# Patient Record
Sex: Female | Born: 1990 | Race: White | Hispanic: No | Marital: Single | Smoking: Current every day smoker
Health system: Southern US, Community
[De-identification: ages and names within clinical notes are randomized; demographics above are authoritative.]

## PROBLEM LIST (undated history)

## (undated) DIAGNOSIS — N059 Unspecified nephritic syndrome with unspecified morphologic changes: Secondary | ICD-10-CM

## (undated) DIAGNOSIS — N1831 Chronic kidney disease, stage 3a: Secondary | ICD-10-CM

## (undated) DIAGNOSIS — B9562 Methicillin resistant Staphylococcus aureus infection as the cause of diseases classified elsewhere: Secondary | ICD-10-CM

## (undated) DIAGNOSIS — F1120 Opioid dependence, uncomplicated: Secondary | ICD-10-CM

## (undated) DIAGNOSIS — M00012 Staphylococcal arthritis, left shoulder: Secondary | ICD-10-CM

## (undated) DIAGNOSIS — K739 Chronic hepatitis, unspecified: Secondary | ICD-10-CM

## (undated) DIAGNOSIS — N189 Chronic kidney disease, unspecified: Secondary | ICD-10-CM

## (undated) DIAGNOSIS — R7881 Bacteremia: Secondary | ICD-10-CM

## (undated) DIAGNOSIS — Z8619 Personal history of other infectious and parasitic diseases: Secondary | ICD-10-CM

## (undated) DIAGNOSIS — I2690 Septic pulmonary embolism without acute cor pulmonale: Secondary | ICD-10-CM

## (undated) DIAGNOSIS — M869 Osteomyelitis, unspecified: Secondary | ICD-10-CM

## (undated) DIAGNOSIS — F1910 Other psychoactive substance abuse, uncomplicated: Secondary | ICD-10-CM

## (undated) DIAGNOSIS — I079 Rheumatic tricuspid valve disease, unspecified: Secondary | ICD-10-CM

## (undated) DIAGNOSIS — M009 Pyogenic arthritis, unspecified: Secondary | ICD-10-CM

## (undated) DIAGNOSIS — Z681 Body mass index (BMI) 19 or less, adult: Secondary | ICD-10-CM

## (undated) HISTORY — DX: Chronic kidney disease, unspecified: N18.9

## (undated) HISTORY — DX: Septic pulmonary embolism without acute cor pulmonale: I26.90

## (undated) HISTORY — DX: Osteomyelitis, unspecified: M86.9

## (undated) HISTORY — DX: Body mass index (BMI) 19.9 or less, adult: Z68.1

## (undated) HISTORY — DX: Chronic hepatitis, unspecified: K73.9

## (undated) HISTORY — DX: Bacteremia: R78.81

## (undated) HISTORY — DX: Personal history of other infectious and parasitic diseases: Z86.19

## (undated) HISTORY — DX: Staphylococcal arthritis, left shoulder: M00.012

## (undated) HISTORY — DX: Unspecified nephritic syndrome with unspecified morphologic changes: N05.9

## (undated) HISTORY — DX: Rheumatic tricuspid valve disease, unspecified: I07.9

## (undated) HISTORY — DX: Pyogenic arthritis, unspecified: M00.9

## (undated) HISTORY — DX: Opioid dependence, uncomplicated: F11.20

## (undated) HISTORY — DX: Chronic kidney disease, stage 3a: N18.31

## (undated) HISTORY — DX: Methicillin resistant Staphylococcus aureus infection as the cause of diseases classified elsewhere: B95.62

---
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
## 2010-01-23 ENCOUNTER — Ambulatory Visit: Payer: Self-pay | Admitting: Family Medicine

## 2010-01-23 DIAGNOSIS — J069 Acute upper respiratory infection, unspecified: Secondary | ICD-10-CM | POA: Insufficient documentation

## 2010-01-23 DIAGNOSIS — J9801 Acute bronchospasm: Secondary | ICD-10-CM

## 2010-01-23 HISTORY — DX: Acute bronchospasm: J98.01

## 2010-01-26 ENCOUNTER — Ambulatory Visit: Payer: Self-pay | Admitting: Family Medicine

## 2010-01-26 DIAGNOSIS — J309 Allergic rhinitis, unspecified: Secondary | ICD-10-CM | POA: Insufficient documentation

## 2010-01-27 ENCOUNTER — Ambulatory Visit: Payer: Self-pay

## 2010-01-27 ENCOUNTER — Encounter: Payer: Self-pay | Admitting: Family Medicine

## 2010-01-27 IMAGING — CR DG CHEST 2V
1 series · 2 of 2 positions shown · non-contrast
Comparison: none

REASON FOR EXAM: cough, sob, night sweats, asthma exacerbation
COMMENTS:

PROCEDURE:     DXR - DXR CHEST PA (OR AP) AND LATERAL  - [DATE]  [DATE]
RESULT:     The lung fields are clear. The heart, mediastinal and osseous
structures reveal no significant abnormalities.

[Series 1: view not recorded · 0.17mm/px · 2 of 2 slices shown]
[im 1/2]
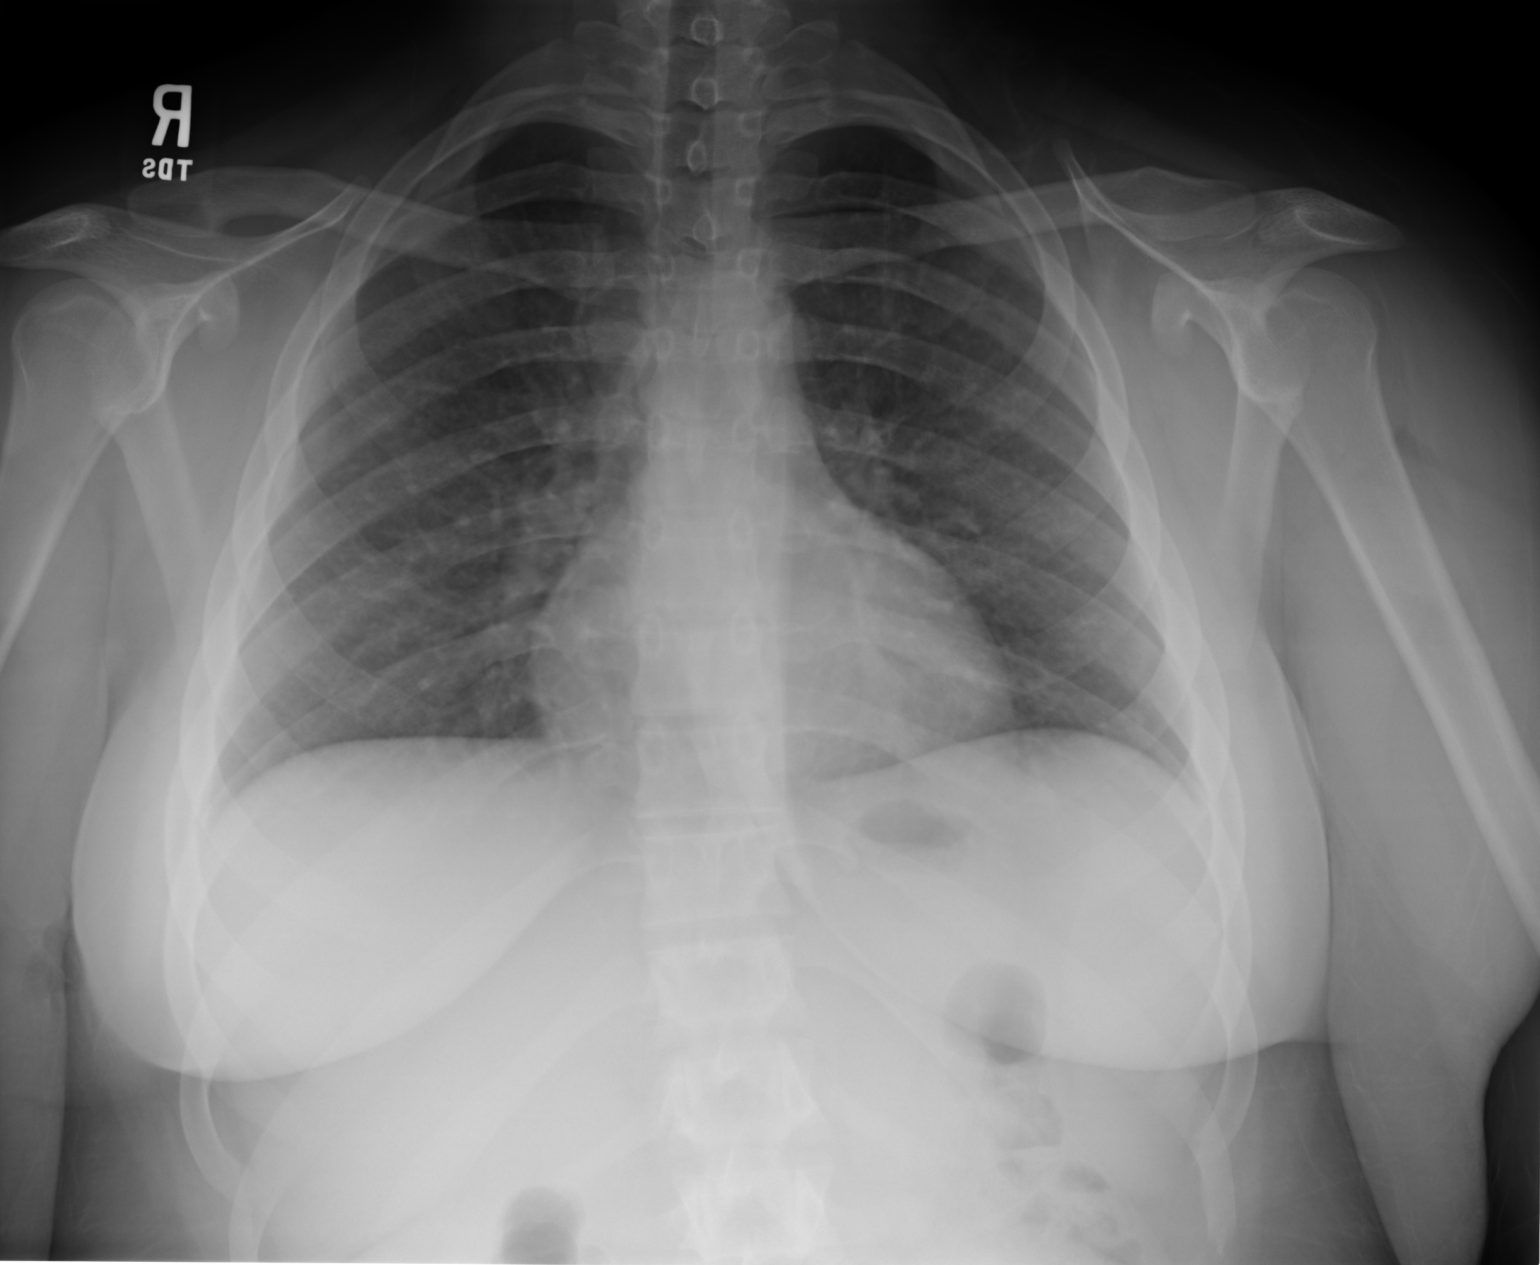
[im 2/2]
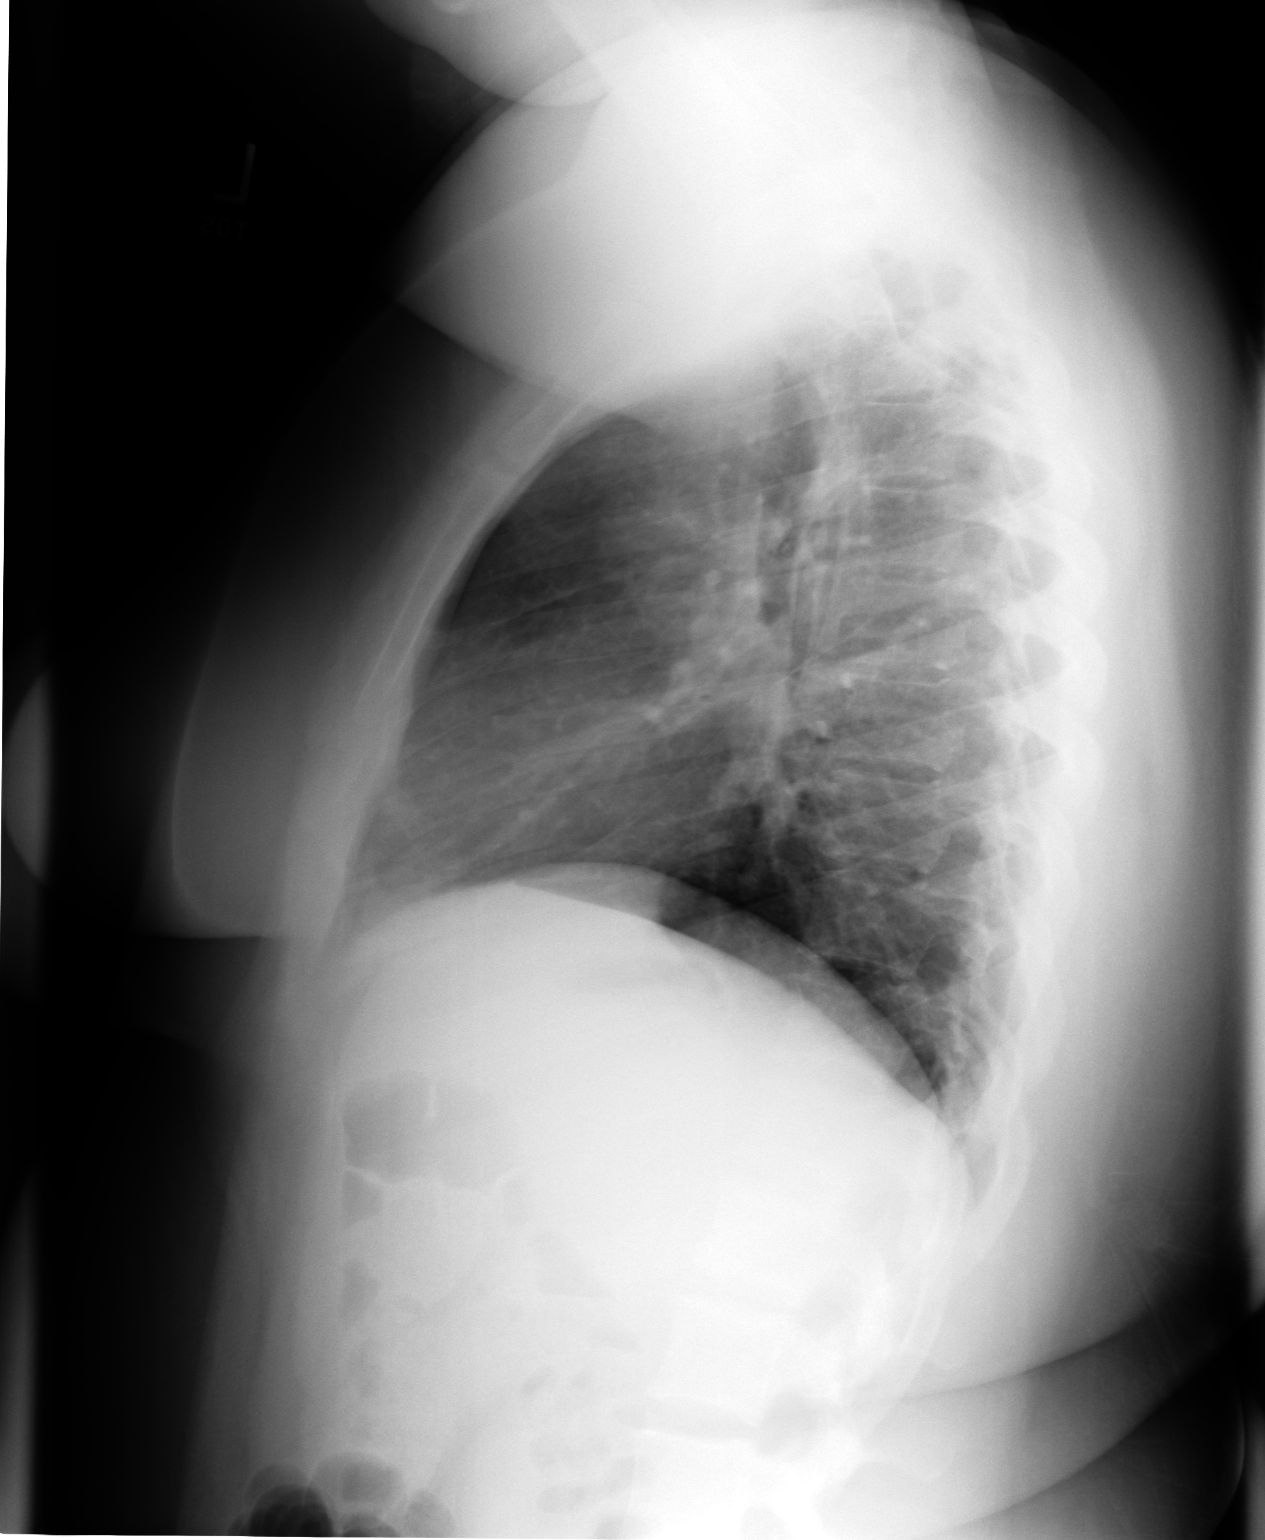

[2 of 2 positions shown; findings below may reference images not displayed]

IMPRESSION: No acute changes are identified.

## 2010-01-28 ENCOUNTER — Encounter: Payer: Self-pay | Admitting: Family Medicine

## 2010-01-30 ENCOUNTER — Ambulatory Visit: Payer: Self-pay | Admitting: Family Medicine

## 2010-02-01 ENCOUNTER — Ambulatory Visit: Payer: Self-pay | Admitting: Family Medicine

## 2010-02-01 ENCOUNTER — Encounter (INDEPENDENT_AMBULATORY_CARE_PROVIDER_SITE_OTHER): Payer: Self-pay | Admitting: Family Medicine

## 2010-02-01 ENCOUNTER — Encounter: Payer: Self-pay | Admitting: Family Medicine

## 2010-02-23 ENCOUNTER — Ambulatory Visit: Payer: Self-pay | Admitting: Family Medicine

## 2010-02-23 DIAGNOSIS — F17200 Nicotine dependence, unspecified, uncomplicated: Secondary | ICD-10-CM | POA: Insufficient documentation

## 2010-02-23 HISTORY — DX: Nicotine dependence, unspecified, uncomplicated: F17.200

## 2010-03-06 ENCOUNTER — Ambulatory Visit: Payer: Self-pay | Admitting: Family Medicine

## 2010-03-06 DIAGNOSIS — J312 Chronic pharyngitis: Secondary | ICD-10-CM

## 2010-03-06 HISTORY — DX: Chronic pharyngitis: J31.2

## 2010-07-04 ENCOUNTER — Ambulatory Visit: Payer: Self-pay | Admitting: Family Medicine

## 2010-07-04 DIAGNOSIS — J45909 Unspecified asthma, uncomplicated: Secondary | ICD-10-CM | POA: Insufficient documentation

## 2010-10-22 NOTE — Letter (Signed)
Summary: *Referral Letter  [facility]  [address]   [place], [place] [address]   Phone: [phone]  Fax: [phone]    01/26/2010  Thank you in advance for agreeing to see my patient:  Carol Rivera [address] [place], [place]  [address]  Phone: [phone]  x-ray order  Reason for Referral: Cough, SOB, NightSweats, Asthma Exacerbation  Procedures Requested:   Chest X-Ray PA/LAT  Current Medical Problems: 1)  ASTHMA (ICD-493.90) 2)  UPPER RESPIRATORY INFECTION, ACUTE, WITH BRONCHITIS (ICD-465.9) 3)  ACUTE BRONCHOSPASM (ICD-519.11) 4) Severe Cough   Past Medical History: 1)  Asthma  Please fax results to [phone]   Thank you again for agreeing to see our patient; please contact us if you have any further questions or need additional information.  Sincerely,  Standley Dakins MD  [phone] [facility], [place]

## 2010-10-22 NOTE — Assessment & Plan Note (Signed)
Summary: RECHECK ASTHMA/EVM   Vital Signs:  Patient profile:   20 year old female Menstrual status:  irregular Height:      62 inches Weight:      221 pounds BMI:     40.57 O2 Sat:      97 % on Room air Temp:     97.7 degrees F oral Pulse rate:   102 / minute Pulse rhythm:   regular Resp:     20 per minute BP sitting:   128 / 70  (right arm)  Vitals Entered By: Levonne Spiller EMT-P (Jan 30, 2010 4:14 PM)  O2 Flow:  Room air  Primary Care Provider:  none   History of Present Illness: Pt returns today for re-evaluation.  Reports that she has been doing her nebulizer scheduled as directed and feels somewhat better. She is still coughing, has rhinorrhea and wheezing. No f/c. Her xray last visit was negative. She feels warm and having night sweats. She however reports that she is feeling better. She was not able to afford the inhaler as it is no longer on the $4 list. She did not start taking the Abx as she was afraid of taking too many medications at one time.   Preventive Screening-Counseling & Management  Alcohol-Tobacco     Smoking Cessation Counseling: yes  Allergies (verified): No Known Drug Allergies  Review of Systems ENT:  Complains of nasal congestion; denies sinus pressure and sore throat. Resp:  Complains of chest discomfort, cough, and wheezing; denies shortness of breath.  Physical Exam  General:  alert, well-developed, and well-nourished.   Ears:  R ear normal and L ear normal.   Neck:  supple and no masses.   Chest Wall:  no tenderness.   Lungs:  R wheezes and L wheezes.   improved after 2 nebulizers - still with increased wheezes right anterior lobe. Heart:  normal rate, regular rhythm, and no murmur.   Cervical Nodes:  No lymphadenopathy noted Psych:  Oriented X3.     Impression & Recommendations:  Problem # 1:  ASTHMA UNSPECIFIED WITH EXACERBATION (ICD-493.92)  Her updated medication list for this problem includes:    Albuterol Sulfate (2.5  Mg/73ml) 0.083% Nebu (Albuterol sulfate) .Marland Kitchen... 1 to use in nebulizer q4-6 hours as needed for shortness of breath/wheeze    Ipratropium Bromide 0.02 % Soln (Ipratropium bromide) ..... Mix 1 vial with albuterol for nebulizer treatments every 4 hours as needed sob, wheezing, cough    Prednisone (pak) 10 Mg Tabs (Prednisone) .Marland Kitchen... Take as directed for the 6 day course of treatment - complete the course    Ventolin Hfa 108 (90 Base) Mcg/act Aers (Albuterol sulfate) .Marland Kitchen... 2 puffs q 3 hours as needed wheezing, sob, cough   continue out of work status.  xray negative.   Orders: Nebulizer Tx ([address]) Nebulizer Tx ([address])  Complete Medication List: 1)  Albuterol Sulfate (2.5 Mg/51ml) 0.083% Nebu (Albuterol sulfate) .Marland Kitchen.. 1 to use in nebulizer q4-6 hours as needed for shortness of breath/wheeze 2)  Doxycycline Hyclate 100 Mg Caps (Doxycycline hyclate) .... Take 1 by mouth two times a day with food 3)  Ipratropium Bromide 0.02 % Soln (Ipratropium bromide) .... Mix 1 vial with albuterol for nebulizer treatments every 4 hours as needed sob, wheezing, cough 4)  Prednisone (pak) 10 Mg Tabs (Prednisone) .... Take as directed for the 6 day course of treatment 5)  Loratadine 10 Mg Tabs (Loratadine) .... Take 1 by mouth daily as needed for allergies 6)  Ventolin Hfa 108 (90 Base) Mcg/act Aers (Albuterol sulfate) .... 2 puffs q 3 hours as needed wheezing, sob, cough  Patient Instructions: 1)  Return to be re-evaluated in 2-3 days.  2)  Please take antibiotic as previously prescribed. (doxycycline)  3)  Please finish taking the prednisone as well.  4)  The other medications you may hold or take as needed.  5)  Tobacco is very bad for your health and your loved ones! You Should stop smoking!.   The patient was informed that there is no on-call provider or services available at this clinic during off-hours (when the clinic is closed).  If the patient developed a problem or concern that required immediate attention,  the patient was advised to go the the nearest available urgent care or emergency department for medical care.  The patient verbalized understanding.      The risks, benefits and possible side effects were clearly explained and discussed with the patient.  The patient verbalized clear understanding.  The patient was given instructions to return if symptoms don't improve, worsen or new changes develop.  If it is not during clinic hours and the patient cannot get back to this clinic then the patient was told to seek medical care at an available urgent care or emergency department.  The patient verbalized understanding.    I have reviewed the above medical office visit documention, including diagnoses, history, medications, clinical lists, orders and plan of care.   Rodney Langton, MD, FAAFP  Jan 31, 2010

## 2010-10-22 NOTE — Letter (Signed)
Summary: Generic Letter  [facility]  [address]   [place], [place] [address]   Phone: [phone]  Fax: [phone]    02/23/2010 MRN: [id number]  [address] [place], [place]  [address]  To Whom It May Concern,  Carol Rivera was seen as a patient in our office today and asked to stay out of work today while recovering from an illness.  She may return to work tomorrow 02/24/10.           Sincerely,   Standley Dakins MD [facility]

## 2010-10-22 NOTE — Letter (Signed)
Summary: Work [employer]  [address]   [place], [place] [address]   Phone: [phone]  Fax: [phone]    Today's Date: July 04, 2010  Name of Patient: Carol Rivera  The above named patient had a medical visit today at:  3:30 pm.  Please take this into consideration when reviewing the time away from work/school.    Special Instructions:  [  ] None  [X]  To be off the remainder of today, returning to the normal work / school schedule tomorrow.  [  ] To be off until the next scheduled appointment on ______________________.  [  ] Other ________________________________________________________________ ________________________________________________________________________   Sincerely yours,   Standley Dakins MD

## 2010-10-22 NOTE — Assessment & Plan Note (Signed)
Summary: sore throat/jbb  this is  Vital Signs:  Patient profile:   20 year old female Menstrual status:  regular LMP:     07/04/2010 Height:      62 inches Weight:      200 pounds O2 Sat:      98 % on Room air Temp:     97.2 degrees F oral Pulse rate:   104 / minute Pulse rhythm:   regular Resp:     20 per minute BP sitting:   137 / 95  (left arm)  Vitals Entered By: Levonne Spiller EMT-P (July 04, 2010 3:29 PM)  O2 Flow:  Room air  Lab Results    Ordered by:  Irven Easterly    Date tests performed: 07/04/2010    Performed by:  Levonne Spiller EMT-P    R. Strep:    Neg    LMP:      07/04/2010   CC: URI / Sore Throat / rwt Is Patient Diabetic? No Pain Assessment Patient in pain? yes     Location: neck Intensity: 7 Type: aching Onset of pain  Gradual LMP (date): 07/04/2010     Menstrual Status regular Enter LMP: 07/04/2010   Chief Complaint:  URI / Sore Throat / rwt.  History of Present Illness: This patient is presenting today because she reports that for the past 2-3 days she has been experiencing increasing wheezing and coughing and sore throat. The patient reports that she has been exposed to sick people at her home that have been coming down with strep throat. Also she reports that she has a need for refills for her asthma medications. She reports that she's using the nebulizers at home but reports that she needs her rescue inhaler and also needs refills on the ipratropium solution. The patient also reports that she needs her ibuprofen refilled for the sore throat. The patient reports that she took yesterday off work. 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 the patient denies chills but reports that she has had some hot spells. She reports that the glands under her neck has been swollen.  Preventive Screening-Counseling & Management  Alcohol-Tobacco     Smoking Status: current     Smoking Cessation Counseling: yes     Smoke Cessation  Stage: contemplative     Packs/Day: 0.5     Tobacco Counseling: to quit use of tobacco products  Comments: counseled for more than 10 minutes to stop smoking and using all tobacco nicotine products.    Problems Prior to Update: 1)  Chronic Pharyngitis  (ICD-472.1) 2)  Asthma  (ICD-493.90) 3)  Smoker  (ICD-305.1) 4)  Allergic Rhinitis  (ICD-477.9) 5)  Upper Respiratory Infection, Acute, With Bronchitis  (ICD-465.9) 6)  Acute Bronchospasm  (ICD-519.11)  Current Problems (verified): 1)  Asthma Unspecified With Exacerbation  (ICD-493.92) 2)  Asthma, Persistent, Mild  (ICD-493.90) 3)  Acute Sinusitis, Unspecified  (ICD-461.9) 4)  Chronic Pharyngitis  (ICD-472.1) 5)  Smoker  (ICD-305.1) 6)  Allergic Rhinitis  (ICD-477.9) 7)  Upper Respiratory Infection, Acute, With Bronchitis  (ICD-465.9) 8)  Acute Bronchospasm  (ICD-519.11)  Allergies (verified): 1)  ! Doxycycline  Past History:  Past Medical History: Last updated: 11-Mar-2010 Asthma Smoker Allergic rhinitis chronic active nicotine dependence Obesity  Past Surgical History: Last updated: 01/26/2010 Dental extractions  Family History: Last updated: 11-Mar-2010 Father deceased of MI - unknown age The patient is working and caring for her daughter.  The grandmother is involved in  helping care for her as well.   Social History: Last updated: 07/04/2010 1/2 ppd x 3 years    Pt has a boyfriend who has come to some office visits. Current Smoker Alcohol use-no Drug use-no  Risk Factors: Smoking Status: current (07/04/2010) Packs/Day: 0.5 (07/04/2010)  Social History: 1/2 ppd x 3 years    Pt has a boyfriend who has come to some office visits. Current Smoker Alcohol use-no Drug use-no Packs/Day:  0.5  Review of Systems       The patient complains of hoarseness.  The patient denies anorexia, fever, weight loss, weight gain, vision loss, decreased hearing, chest pain, dyspnea on exertion, peripheral edema, headaches,  hemoptysis, abdominal pain, melena, severe indigestion/heartburn, suspicious skin lesions, transient blindness, depression, abnormal bleeding, and breast masses.         All other systems reviewed and reported as negative.   Physical Exam  General:  well developed, well nourished, no acute distress Head:  normocephalic and atraumatic.   Eyes:  pupils equal, pupils round, and pupils reactive to light.   Ears:  R ear normal and L ear normal.   Nose:  mucosal erythema and mucosal edema.   Mouth:  normal dentition.   tongue ring presenttonsil hypertropied, postnasal drip, and pharyngeal crowding.   Neck:  neck supple,  trachea midline, no masses Chest Wall:  no tenderness.   Lungs:  R wheezes and L wheezes.   improved after 1 nebulizer treatments - bilateral breath sounds, no consolidation  Heart:  normal rate, regular rhythm, and no murmur.   Abdomen:  soft and non-tender.   Msk:  normal ROM.   Pulses:  R radial normal and R femoral normal.   Extremities:  No clubbing, cyanosis, edema, or deformity noted with normal full range of motion of all joints.   Neurologic:  No cranial nerve deficits noted. Station and gait are normal. Plantar reflexes are down-going bilaterally. DTRs are symmetrical throughout. Sensory, motor and coordinative functions appear intact. Skin:  no obvious rashes or lesions Cervical Nodes:  R anterior LN tender, R posterior LN tender, and L posterior LN tender.   Psych:  Cognition and judgment appear intact. Alert and cooperative with normal attention span and concentration. No apparent delusions, illusions, hallucinations   Problems:  Medical Problems Added: 1)  Dx of Asthma Unspecified With Exacerbation  (ICD-493.92) 2)  Dx of Asthma, Persistent, Mild  (ICD-493.90) 3)  Dx of Acute Sinusitis, Unspecified  (ICD-461.9)  Impression & Recommendations:  Problem # 1:  ASTHMA UNSPECIFIED WITH EXACERBATION (ICD-493.92)  Her updated medication list for this problem  includes:    Albuterol Sulfate (2.5 Mg/75ml) 0.083% Nebu (Albuterol sulfate) .Marland Kitchen... 1 to use in nebulizer q4-6 hours as needed for shortness of breath/wheeze    Ipratropium Bromide 0.02 % Soln (Ipratropium bromide) ..... Mix 1 vial with albuterol for nebulizer treatments every 4 hours as needed sob, wheezing, cough    Ventolin Hfa 108 (90 Base) Mcg/act Aers (Albuterol sulfate) .Marland Kitchen... 2 puffs q 3 hours as needed wheezing, sob, cough    Proventil Hfa 108 (90 Base) Mcg/act Aers (Albuterol sulfate) ..... Inhale 2 puffs four times a day as needed.    Prednisone 10 Mg Tabs (Prednisone) .Marland Kitchen... Take 3 tabs by mouth daily for 2 days then 2 tabs daily for 3 days  Orders: Tobacco use cessation intensive >10 minutes ([address])  Problem # 2:  ACUTE SINUSITIS, UNSPECIFIED (ICD-461.9)  Her updated medication list for this problem includes:    Amoxicillin 500  Mg Caps (Amoxicillin) .Marland Kitchen... Take 1 by mouth every 8 hours  Problem # 3:  SMOKER (ICD-305.1) Assessment: Unchanged  Orders: Tobacco use cessation intensive >10 minutes ([address])  Complete Medication List: 1)  Albuterol Sulfate (2.5 Mg/57ml) 0.083% Nebu (Albuterol sulfate) .Marland Kitchen.. 1 to use in nebulizer q4-6 hours as needed for shortness of breath/wheeze 2)  Ipratropium Bromide 0.02 % Soln (Ipratropium bromide) .... Mix 1 vial with albuterol for nebulizer treatments every 4 hours as needed sob, wheezing, cough 3)  Loratadine 10 Mg Tabs (Loratadine) .... Take 1 by mouth daily as needed for allergies 4)  Ventolin Hfa 108 (90 Base) Mcg/act Aers (Albuterol sulfate) .... 2 puffs q 3 hours as needed wheezing, sob, cough 5)  Proventil Hfa 108 (90 Base) Mcg/act Aers (Albuterol sulfate) .... Inhale 2 puffs four times a day as needed. 6)  Ibuprofen 800 Mg Tabs (Ibuprofen) .Marland Kitchen.. 1 by mouth three times a day as needed pain/fever 7)  Amoxicillin 500 Mg Caps (Amoxicillin) .... Take 1 by mouth every 8 hours 8)  Prednisone 10 Mg Tabs (Prednisone) .... Take 3 tabs by mouth daily  for 2 days then 2 tabs daily for 3 days he is  Patient Instructions: 1)  Tobacco is very bad for your health and your loved ones! You Should stop smoking!. 2)  Stop Smoking Tips: Choose a Quit date. Cut down before the Quit date. decide what you will do as a substitute when you feel the urge to smoke (gum,toothpick,exercise). 3)  Please return in 3 weeks for an asthma follow up exam.  4)  Please return or seek care if symptoms worsen or don't improve or new problems develop.  5)  Call 1-800 QUIT NOW to get a free smoking coach to help you quit smoking.  6)  Please go pick up your prescriptions and take them as prescribed.  7)  Please schedule a follow-up appointment in 3 weeks. 8)  Come get a flu vaccine as soon as you're feeling better.  9)  Take 650-1000mg  of Tylenol every 4-6 hours as needed for relief of pain or comfort of fever AVOID taking more than 4000mg   in a 24 hour period (can cause liver damage in higher doses). 10)  Take 400-600mg  of Ibuprofen (Advil, Motrin) with food every 8 hours as needed for relief of pain or comfort of fever. 11)  Drink as much fluid as you can tolerate for the next few days. 12)  Establish with your own PCP so you can be followed closely and get a referral to a pulmonologist if needed.  Please follow up in 3 weeks here so we can reassess your asthma and start different treatments if needed.  Also, please try to stop smoking.  Prescriptions: PREDNISONE 10 MG TABS (PREDNISONE) take 3 tabs by mouth daily for 2 days then 2 tabs daily for 3 days  #12 x 0   Entered and Authorized by:   Standley Dakins MD   Signed by:   Standley Dakins MD on 07/04/2010   Method used:   Electronically to        [facility] [address] (retail)       [address], [address]       [place], [place]  [address]       Ph: [phone]       Fax: [phone]   RxID:   [id number] IBUPROFEN 800 MG TABS (IBUPROFEN) 1 by mouth three times a day  as  needed pain/fever  #30 x 0   Entered and Authorized by:   Standley Dakins MD   Signed by:   Standley Dakins MD on 07/04/2010   Method used:   Electronically to        [facility] [address] (retail)       [address], [address]       [place], [place]  [address]       Ph: [phone]       Fax: [phone]   RxID:   [id number] AMOXICILLIN 500 MG CAPS (AMOXICILLIN) take 1 by mouth every 8 hours  #30 x 0   Entered and Authorized by:   Standley Dakins MD   Signed by:   Standley Dakins MD on 07/04/2010   Method used:   Electronically to        [facility] [address] (retail)       [address], [address]       [place], [place]  [address]       Ph: [phone]       Fax: [phone]   RxID:   [id number] VENTOLIN HFA 108 (90 BASE) MCG/ACT AERS (ALBUTEROL SULFATE) 2 puffs q 3 hours as needed wheezing, sob, cough  #1 x 4   Entered and Authorized by:   Standley Dakins MD   Signed by:   Standley Dakins MD on 07/04/2010   Method used:   Electronically to        [facility] [address] (retail)       [address], [address]       [place], [place]  [address]       Ph: [phone]       Fax: [phone]   RxID:   [id number] IPRATROPIUM BROMIDE 0.02 % SOLN (IPRATROPIUM BROMIDE) mix 1 vial with albuterol for nebulizer treatments every 4 hours as needed sob, wheezing, cough  #1 box of 25 x 4   Entered and Authorized by:   Standley Dakins MD   Signed by:   Standley Dakins MD on 07/04/2010   Method used:   Electronically to        [facility] [address] (retail)       [address], [address]       [place], [place]  [address]       Ph: [phone]       Fax: [phone]   RxID:   [id number]  Pt declined flu vaccine today.  I did ask her to return to get one when feeling better.  Also, asked patient to return in 3 weeks for an asthma follow up.    Prevention & Chronic Care Immunizations   Influenza vaccine: Not documented    Tetanus booster: Not documented    Pneumococcal vaccine: Not documented  Other Screening   Pap smear: Not documented   Smoking status: current  (07/04/2010)   Smoking cessation counseling: yes  (07/04/2010)

## 2010-10-22 NOTE — Assessment & Plan Note (Signed)
Summary: TONSILS SWOLLEN/EVM   Vital Signs:  Patient profile:   20 year old female Menstrual status:  irregular Height:      62 inches Weight:      219 pounds Temp:     98.4 degrees F oral Pulse rate:   96 / minute Pulse rhythm:   regular Resp:     20 per minute BP sitting:   116 / 60 Cuff size:   large  Vitals Entered By: Providence Crosby LPN (February 24, 9380 11:24 AM) Physical Exam General appearance: well developed, well nourished, no acute distress Head: normocephalic, atraumatic Eyes: conjunctivae and lids normal Pupils: equal, round, reactive to light Ears: normal, no lesions or deformities Nasal: swollen red turbinates with congestion Oral/Pharynx: bilateral tonsilar enlargement, uvula midline without deviation, erythematous mucosa, no petechia directly seen, tonsils touching Neck: neck supple,  trachea midline, no masses Chest/Lungs: no rales, wheezes, or rhonchi bilateral, breath sounds equal without effort Heart: regular rate and  rhythm, no murmur Abdomen: soft, non-tender without obvious organomegaly Extremities: normal extremities Neurological: grossly intact and non-focal Skin: no obvious rashes or lesions MSE: oriented to time, place, and person    Visit Type:  swollen tonsils  Chief Complaint:  sore throat.  History of Present Illness: swollen tonsils sore throat started 2 days ago . No fever; difficulty swollowing and talking.  Pt reports that she never got the antibiotics filled that were prescribed by Dr. Severiano Gilbert 2 weeks ago because she was told that it would cost $50 and she didn't have the money.  Also, she says that she was not able to complete the course of doxycycline because it caused her to experience some joint pain and discomfort. Pt is still smoking but she says that she has been cutting down.  She is still smoking 1/2 pack per day.  She denies known sick contacts and abdominal pain.    Preventive Screening-Counseling & Management  Alcohol-Tobacco  Smoking Status: current     Tobacco Counseling: to quit use of tobacco products  Allergies: No Known Drug Allergies  Comments:  Provider: The patient's medications and allergies were reviewed with the patient and were updated in the Medication and Allergy Lists. The patient's problems were reviewed and were updated in the Problem Lists The patient's old records were reviewed and the chart updated.  Past History:  Family History: Last updated: March 05, 2010 Father deceased of MI - unknown age The patient is working and caring for her daughter.  The grandmother is involved in helping care for her as well.   Social History: Last updated: 01/26/2010 1/2 ppd x 3 years Current Smoker Alcohol use-no Drug use-no  Risk Factors: Smoking Status: current (2010/03/05)  Past medical, surgical, family and social histories (including risk factors) reviewed, and no changes noted (except as noted below).  Past Medical History: Asthma Smoker Allergic rhinitis chronic active nicotine dependence Obesity  Past Surgical History: Reviewed history from 01/26/2010 and no changes required. Dental extractions  Family History: Reviewed history from 01/26/2010 and no changes required. Father deceased of MI - unknown age The patient is working and caring for her daughter.  The grandmother is involved in helping care for her as well.   Social History: Reviewed history from 01/26/2010 and no changes required. 1/2 ppd x 3 years Current Smoker Alcohol use-no Drug use-no History of Present Illness History from: patient Reason for visit: swollen tonsils Chief Complaint: sore throat History of Present Illness: sore throat  REVIEW OF SYSTEMS  Genitourniary       Denies bedwetting and blood or discharge from vagina.   Other Comments:      The patient complains of sore throat and odynophagia.  The patient denies fever, chills, sweats, anorexia, weight loss, sleep disorder, blurring,  diplopia, eye irritation, eye discharge, nosebleeds, hoarseness, chest pain, palpitations, syncope, dyspnea on exertion, orthopnea, hemoptysis, wheezing, pleurisy, nausea, vomiting, diarrhea, abdominal pain, jaundice, gas/bloating, indigestion/heartburn, hematuria, urinary frequency, urinary hesitancy, nocturia, and incontinence.     Review of Systems       The patient complains of sore throat and odynophagia.  The patient denies fever, chills, sweats, anorexia, weight loss, sleep disorder, blurring, diplopia, eye irritation, eye discharge, nosebleeds, hoarseness, chest pain, palpitations, syncope, dyspnea on exertion, orthopnea, hemoptysis, wheezing, pleurisy, nausea, vomiting, diarrhea, abdominal pain, jaundice, gas/bloating, indigestion/heartburn, hematuria, urinary frequency, urinary hesitancy, nocturia, and incontinence.     Complete Medication List: 1)  Albuterol Sulfate (2.5 Mg/49ml) 0.083% Nebu (Albuterol sulfate) .Marland Kitchen.. 1 to use in nebulizer q4-6 hours as needed for shortness of breath/wheeze 2)  Ipratropium Bromide 0.02 % Soln (Ipratropium bromide) .... Mix 1 vial with albuterol for nebulizer treatments every 4 hours as needed sob, wheezing, cough 3)  Loratadine 10 Mg Tabs (Loratadine) .... Take 1 by mouth daily as needed for allergies 4)  Ventolin Hfa 108 (90 Base) Mcg/act Aers (Albuterol sulfate) .... 2 puffs q 3 hours as needed wheezing, sob, cough 5)  Proventil Hfa 108 (90 Base) Mcg/act Aers (Albuterol sulfate) .... Inhale 2 puffs four times a day as needed. 6)  Amoxicillin 500 Mg Caps (Amoxicillin) .... Take 1 by mouth two times a day till all completed 7)  Ibuprofen 800 Mg Tabs (Ibuprofen) .... Take 1 by mouth every 8 hours with food as needed sore throat pain  Other Orders: Rapid Strep-[facility] (16109) (U0454) Current Tobacco Smoker (U9811) (B1478) Tobacco (smoke) use Cessation Intervention-Counseling (G9562) Depo- Medrol 80mg  (J1040) Prescriptions: IBUPROFEN 800 MG TABS (IBUPROFEN)  take 1 by mouth every 8 hours with food as needed sore throat pain  #15 x 0   Entered and Authorized by:   Standley Dakins MD   Signed by:   Standley Dakins MD on 02/23/2010   Method used:   Electronically to        [facility] [address] (retail)       [address], [address]       [place], [place]  [address]       Ph: [phone]       Fax: [phone]   RxID:   [id number] AMOXICILLIN 500 MG CAPS (AMOXICILLIN) take 1 by mouth two times a day till all completed  #14 x 0   Entered and Authorized by:   Standley Dakins MD   Signed by:   Standley Dakins MD on 02/23/2010   Method used:   Electronically to        [facility] [address] (retail)       [address], [address]       [place], [place]  [address]       Ph: [phone]       Fax: [phone]   RxID:   [id number]  REVIEW OF SYSTEMS       Genitourniary       Denies bedwetting and blood or discharge from vagina.   Other Comments:      The patient complains  of sore throat and odynophagia.  The patient denies fever, chills, sweats, anorexia, weight loss, sleep disorder, blurring, diplopia, eye irritation, eye discharge, nosebleeds, hoarseness, chest pain, palpitations, syncope, dyspnea on exertion, orthopnea, hemoptysis, wheezing, pleurisy, nausea, vomiting, diarrhea, abdominal pain, jaundice, gas/bloating, indigestion/heartburn, hematuria, urinary frequency, urinary hesitancy, nocturia, and incontinence.    Laboratory Results  Date/Time Received: February 23, 2010 11:33 AM Date/Time Reported: February 23, 2010 11:33 AM  Other Tests  Rapid Strep: negative  The patient was counseled and advised to stop using all tobacco products.  Medical assistance was offered and the patient was encouraged to call 1-800-QUIT-NOW to get a smoking cessation coach.     The risks, benefits and possible side effects were clearly explained and discussed with the patient.  The  patient verbalized clear understanding.  The patient was given instructions to return if symptoms don't improve, worsen or new changes develop.  If it is not during clinic hours and the patient cannot get back to this clinic then the patient was told to seek medical care at an available urgent care or emergency department.  The patient verbalized understanding.    It was clearly explained to the patient that this [facility] is not intended to be a primary care clinic.  The patient is always better served by the continuity of care and the provider/patient relationships developed with their dedicated primary care provider.  The patient was told to be sure to follow up as soon as possible with their primary care provider to discuss treatments received and to receive further examination and testing.  The patient verbalized understanding. The will f/u with PCP ASAP.   The patient's prescriptions were checked for possible interactions and electronically sent to the pharmacy of choice.    The patient was informed that there is no on-call provider or services available at this clinic during off-hours (when the clinic is closed).  If the patient developed a problem or concern that required immediate attention, the patient was advised to go the the nearest available urgent care or emergency department for medical care.  The patient verbalized understanding.   Rodney Langton, M.D., F.A.A.F.P.  February 23, 2010      Medication Administration  Injection # 1:    Medication: Depo- Medrol 80mg     Diagnosis: ACUTE PHARYNGITIS (ICD-462)    Route: IM    Site: LUOQ gluteus    Exp Date: 07/2010    Lot #: [id number]    Mfr: Pharmacia    Patient tolerated injection without complications    Given by: Providence Crosby LPN (February 24, 3663 12:24 PM)  Orders Added: 1)  Rapid Strep-[facility] [87430] 2)  Est. Patient Level IV [40347] 3)  (Q2595) Current Tobacco Smoker [G8455] 4)  (G3875) Tobacco (smoke) use Cessation  Intervention-Counseling [G8402] 5)  Depo- Medrol 80mg  [J1040]

## 2010-10-22 NOTE — Letter (Signed)
Summary: Work Excuse  Work Excuse   Imported By: Chelsea Aus 01/27/2010 11:47:16  _____________________________________________________________________  External Attachment:    Type:   Image     Comment:   External Document

## 2010-10-22 NOTE — Assessment & Plan Note (Signed)
Summary: f/u bronchitis/asthma   Vital Signs:  Patient Profile:   20 Years Old Female CC:      F/U Asthma/RWT Height:     62 inches Weight:      221 pounds BMI:     40.57 O2 Sat:      99 % Temp:     97.6 degrees F oral Pulse rate:   110 / minute Pulse rhythm:   regular Resp:     20 per minute BP sitting:   108 / 70  (left arm)  Pt. in pain?   no  Vitals Entered By: Levonne Spiller EMT-P (Feb 01, 2010 1:35 PM)              Is Patient Diabetic? No Comments Pt. is a smoker.      Current Allergies: No known allergies History of Present Illness Chief Complaint: F/U Asthma/RWT History of Present Illness: Pt reports that she feels a little bit better. Not coughing as much. Chest isnt as tight, but still some tightness and wheeze.   REVIEW OF SYSTEMS Constitutional Symptoms      Denies fever, chills, and night sweats.  Ear/Nose/Throat/Mouth       Complains of frequent runny nose.      Denies ear pain, ear discharge, dizziness, frequent nose bleeds, sinus problems, sore throat, and hoarseness.  Respiratory       Complains of dry cough, wheezing, and asthma.      Denies productive cough, shortness of breath, bronchitis, and emphysema/COPD.     Skin       Denies bruising.   Past History:  Past Medical History: Last updated: 2010-01-30 Asthma Allergic rhinitis chronic active nicotine dependence Obesity  Past Surgical History: Last updated: Jan 30, 2010 Dental extractions  Family History: Last updated: 2010-01-30 Father deceased of MI - unknown age  Social History: Last updated: 01/30/10 1/2 ppd x 3 years Current Smoker Alcohol use-no Drug use-no  Risk Factors: Smoking Status: current (2010-01-30) Physical Exam General appearance: well developed, well nourished, no acute distress - sounded better Oral/Pharynx: tongue normal, posterior pharynx without erythema or exudate Neck: neck supple,  trachea midline, no masses Chest/Lungs: improved aeration with  scattered wheezes throughout Heart: regular rate and  rhythm, no murmur Skin: no obvious rashes or lesions MSE: oriented to time, place, and person  Plan New Orders: Est. Patient Level II [81017] Planning Comments:   Cleared patient to return to work. If she doesn't continue to improve over the next 4-5 days then she should return, or sooner if she worsens.  Return to work/school on 02/02/2010 Work Excuse: given previously.  The patient and/or caregiver has been counseled thoroughly with regard to medications prescribed including dosage, schedule, interactions, rationale for use, and possible side effects and they verbalize understanding.  Diagnoses and expected course of recovery discussed and will return if not improved as expected or if the condition worsens. Patient and/or caregiver verbalized understanding.  The patient was informed that there is no on-call provider or services available at this clinic during off-hours (when the clinic is closed).  If the patient developed a problem or concern that required immediate attention, the patient was advised to go the the nearest available urgent care or emergency department for medical care.  The patient verbalized understanding.    The risks, benefits and possible side effects were clearly explained and discussed with the patient.  The patient verbalized clear understanding.  The patient was given instructions to return if symptoms don't improve, worsen or new  changes develop.  If it is not during clinic hours and the patient cannot get back to this clinic then the patient was told to seek medical care at an available urgent care or emergency department.  The patient verbalized understanding.   ]I have reviewed the above medical office visit documention, including diagnoses, history, medications, clinical lists, orders and plan of care.   Rodney Langton, MD, FAAFP  Feb 01, 2010

## 2010-10-22 NOTE — Assessment & Plan Note (Signed)
Summary: DR NOTE AND PRESCRIPTION/EVM   Vital Signs:  Patient profile:   20 year old female Menstrual status:  irregular Height:      62 inches Weight:      217 pounds BMI:     39.83 O2 Sat:      98 % Temp:     98.03 degrees F oral Pulse rate:   90 / minute Pulse rhythm:   regular Resp:     20 per minute BP sitting:   120 / 82  (right arm)  Vitals Entered By: Levonne Spiller EMT-P (March 06, 2010 3:19 PM)  Allergies (verified): 1)  ! Doxycycline  Social History: Reviewed history from 01/26/2010 and no changes required. 1/2 ppd x 3 years Current Smoker Alcohol use-no Drug use-no   Complete Medication List: 1)  Albuterol Sulfate (2.5 Mg/71ml) 0.083% Nebu (Albuterol sulfate) .Marland Kitchen.. 1 to use in nebulizer q4-6 hours as needed for shortness of breath/wheeze 2)  Ipratropium Bromide 0.02 % Soln (Ipratropium bromide) .... Mix 1 vial with albuterol for nebulizer treatments every 4 hours as needed sob, wheezing, cough 3)  Loratadine 10 Mg Tabs (Loratadine) .... Take 1 by mouth daily as needed for allergies 4)  Ventolin Hfa 108 (90 Base) Mcg/act Aers (Albuterol sulfate) .... 2 puffs q 3 hours as needed wheezing, sob, cough 5)  Proventil Hfa 108 (90 Base) Mcg/act Aers (Albuterol sulfate) .... Inhale 2 puffs four times a day as needed. 6)  Amoxicillin 500 Mg Tabs (Amoxicillin) .Marland Kitchen.. 1 by mouth three times a day x 10 days 7)  Ibuprofen 800 Mg Tabs (Ibuprofen) .Marland Kitchen.. 1 by mouth three times a day as needed pain/fever Prescriptions: IBUPROFEN 800 MG TABS (IBUPROFEN) 1 by mouth three times a day as needed pain/fever  #30 x 0   Entered and Authorized by:   Tacey Ruiz MD   Signed by:   Tacey Ruiz MD on 03/06/2010   Method used:   Electronically to        [facility] [address] (retail)       [address], [address]       [place], [place]  [address]       Ph: [phone]       Fax: [phone]   RxID:   [id number] AMOXICILLIN 500 MG TABS (AMOXICILLIN) 1 by mouth  three times a day x 10 days  #30 x 0   Entered and Authorized by:   Tacey Ruiz MD   Signed by:   Tacey Ruiz MD on 03/06/2010   Method used:   Electronically to        [facility] [address] (retail)       [address]       [place], [place]  [address]       Ph: [phone]       Fax: [phone]   RxID:   [id number]  Assessment New Problems: CHRONIC PHARYNGITIS (ICD-472.1)   Patient Education: Demonstrates unwillingness to comply.  Plan New Medications/Changes: IBUPROFEN 800 MG TABS (IBUPROFEN) 1 by mouth three times a day as needed pain/fever  #30 x 0, 03/06/2010, Tacey Ruiz MD AMOXICILLIN 500 MG TABS (AMOXICILLIN) 1 by mouth three times a day x 10 days  #30 x 0, 03/06/2010, Tacey Ruiz MD  New Orders: Est. Patient Level IV [id number] Planning Comments:   Patient was again asked to call her insurance company  to ask about her prescription benefits and if there is a medication that she is unable to purchase then to call/come by the office so that we could give her a more cost effective alternative. she did not appear to  comprehend.  Work/School Excuse: Return to work/school tomorrow  The patient and/or caregiver has been counseled thoroughly with regard to medications prescribed including dosage, schedule, interactions, rationale for use, and possible side effects and they verbalize understanding.  Diagnoses and expected course of recovery discussed and will return if not improved as expected or if the condition worsens. Patient and/or caregiver verbalized understanding.    History of Present Illness History of Present Illness: Patient returns today and reports that she still has a sore throat although it has improved. She reports that she has been coughing up a lot of phlegm. No f/c. + fatigue. She had to leave work early yesterday because not feeling well. She continues to smoke and reports that she hears herself wheeze. Not using her  inhaler or NEB. (Did not purchase her inhaler as she reports that it cost too much money).      Physical Exam General appearance: well developed, well nourished, no acute distress Nasal: mucosa pink, nonedematous, no septal deviation, turbinates normal Oral/Pharynx: Enlarged tonsils L>R, no exudate. + clear PND. Neck: neck supple,  trachea midline, no masses Chest/Lungs: wheezes and rhonchi throughout Heart: regular rate and  rhythm, no murmur MSE: oriented to time, place, and person   REVIEW OF SYSTEMS Constitutional Symptoms       Complains of fatigue.     Denies fever, chills, night sweats, weight loss, and weight gain.  Eyes       Denies change in vision, eye pain, eye discharge, glasses, contact lenses, and eye surgery. Ear/Nose/Throat/Mouth       Complains of sinus problems and sore throat.      Denies hearing loss/aids, change in hearing, ear pain, ear discharge, dizziness, frequent runny nose, frequent nose bleeds, hoarseness, and tooth pain or bleeding.  Respiratory       Complains of dry cough, productive cough, wheezing, shortness of breath, and asthma.      Denies bronchitis and emphysema/COPD.  Cardiovascular       Denies murmurs, chest pain, and tires easily with exhertion.    Gastrointestinal       Denies stomach pain, nausea/vomiting, diarrhea, constipation, blood in bowel movements, and indigestion. Genitourniary       Denies painful urination, kidney stones, and loss of urinary control. Neurological       Denies paralysis, seizures, and fainting/blackouts. Musculoskeletal       Denies muscle pain, joint pain, joint stiffness, decreased range of motion, redness, swelling, muscle weakness, and gout.  Skin       Denies bruising, unusual mles/lumps or sores, and hair/skin or nail changes.  Psych       Denies mood changes, temper/anger issues, anxiety/stress, speech problems, depression, and sleep problems.  The risks, benefits and possible side effects of the  treatments and tests were explained clearly to the patient and the patient verbalized understanding.  The patient has been informed that he/she needs to obtain a primary care physician for completeness and better continuity of care. We are a conveinent care facility and not a primary care office. She was also encouraged to follow up with a pulmonologist.   The patient was informed that there is no on-call provider or services available at this clinic during off-hours (when the clinic is closed).  If the patient developed a problem or concern that required immediate attention, the patient was advised to go the the nearest available urgent care or emergency department for medical care.  The patient verbalized understanding.

## 2010-10-22 NOTE — Assessment & Plan Note (Signed)
Summary: CHEST PAIN/PROBLEMS BREATHING/JBB   Vital Signs:  Patient Profile:   20 Years Old Female Weight:      217 pounds O2 Sat:      98 % O2 treatment:    Room Air Temp:     97.9 degrees F oral Pulse rate:   103 / minute Pulse rhythm:   regular BP sitting:   116 / 72  (right arm)                History of Present Illness Reason for visit: chest pains and trouble breathing History of Present Illness: for about 4-5 days has been having chest tightness and shortness of breath. It got somewhat better but over last 2 days it started again. No fever/chills. Her sister had  strep throat twice in the last month. She does have sorethroat, worse with swallowing, but pain without any intake. Nasal congestion is also a chief complaint. She does describe night sweats.  + smoker  Reports ? h/o asthma when she was younger.   Current Problems: UPPER RESPIRATORY INFECTION, ACUTE, WITH BRONCHITIS (ICD-465.9) ACUTE BRONCHOSPASM (ICD-519.11)   Current Meds ALBUTEROL SULFATE (2.5 MG/3ML) 0.083% NEBU (ALBUTEROL SULFATE) 1 to use in Nebulizer Q4-6 hours as needed for shortness of breath/wheeze ZITHROMAX Z-PAK 250 MG TABS (AZITHROMYCIN) as directed PREDNISONE 20 MG TABS (PREDNISONE) 1 by mouth two times a day x 4 days MAXIFED-G 40-400 MG TABS (PSEUDOEPHEDRINE-GUAIFENESIN) 1 by mouth q6-8 hours as needed congestion  REVIEW OF SYSTEMS Constitutional Symptoms       Complains of night sweats and fatigue.     Denies fever and chills.  Ear/Nose/Throat/Mouth       Complains of sore throat and hoarseness.      Denies ear pain, dizziness, and frequent runny nose.  Respiratory       Complains of productive cough, shortness of breath, and asthma.      Comments: yellow sputum production Cardiovascular       Complains of chest pain and tires easily with exhertion.    Gastrointestinal       Complains of diarrhea.      Denies nausea/vomiting. Neurological       Complains of headaches.    Past  History:  Family History: Last updated: 01-29-2010 Father deceased of MI - unknown age  Social History: Last updated: 01/29/10 1/2 ppd x 3 years  Family History: Father deceased of MI - unknown age  Social History: 1/2 ppd x 3 years Physical Exam General appearance: well developed, well nourished - appeared uncomfortable and sounded congested Eyes: conjunctivae and lids normal Ears: normal, no lesions or deformities Nasal: swollen red turbinates with congestion Oral/Pharynx: swollen, erythemic, edematous uvula Neck: neck supple,  trachea midline, no masses Chest/Lungs: scattered wheezes throughout all lobes - improved except in anterior right apex after neb Heart: regular rate and  rhythm, no murmur - tachycardic Skin: no obvious rashes or lesions  Assessment New Problems: UPPER RESPIRATORY INFECTION, ACUTE, WITH BRONCHITIS (ICD-465.9) ACUTE BRONCHOSPASM (ICD-519.11)   Patient Education: Patient and/or caregiver instructed in the following: rest fluids and Tylenol.  Plan New Medications/Changes: MAXIFED-G 40-400 MG TABS (PSEUDOEPHEDRINE-GUAIFENESIN) 1 by mouth q6-8 hours as needed congestion  #12 x 0, 2010-01-29, Tacey Ruiz MD PREDNISONE 20 MG TABS (PREDNISONE) 1 by mouth two times a day x 4 days  #8 x 0, Jan 29, 2010, Tacey Ruiz MD ZITHROMAX Z-PAK 250 MG TABS (AZITHROMYCIN) as directed  #1 pk x 0, 2010-01-29, Tacey Ruiz MD ALBUTEROL SULFATE (2.5 MG/3ML) 0.083% NEBU (  ALBUTEROL SULFATE) 1 to use in Nebulizer Q4-6 hours as needed for shortness of breath/wheeze  #1 box x 0, 01/23/2010, Tacey Ruiz MD  New Orders: New Patient Level III [99203] Rapid Strep-[facility] [87430] Nebulizer- [facility] [[address]] Planning Comments:   Her mother has a nebulizer at home and Kaegan has used one in the past and was instructed today on use. She was given the mask to take home. She was informed that we have no after hours coverage.  Follow Up: Follow up in 2-3 days if no improvement, Follow up with  Primary Physician  The patient and/or caregiver has been counseled thoroughly with regard to medications prescribed including dosage, schedule, interactions, rationale for use, and possible side effects and they verbalize understanding.  Diagnoses and expected course of recovery discussed and will return if not improved as expected or if the condition worsens. Patient and/or caregiver verbalized understanding.  Prescriptions: MAXIFED-G 40-400 MG TABS (PSEUDOEPHEDRINE-GUAIFENESIN) 1 by mouth q6-8 hours as needed congestion  #12 x 0   Entered and Authorized by:   Tacey Ruiz MD   Signed by:   Tacey Ruiz MD on 01/23/2010   Method used:   Electronically to        [facility] [address] (retail)       [address], [address]       [place], [place]  [address]       Ph: [phone]       Fax: [phone]   RxID:   [id number] PREDNISONE 20 MG TABS (PREDNISONE) 1 by mouth two times a day x 4 days  #8 x 0   Entered and Authorized by:   Tacey Ruiz MD   Signed by:   Tacey Ruiz MD on 01/23/2010   Method used:   Electronically to        [facility] [address] (retail)       [address], [address]       [place], [place]  [address]       Ph: [phone]       Fax: [phone]   RxID:   [id number] ZITHROMAX Z-PAK 250 MG TABS (AZITHROMYCIN) as directed  #1 pk x 0   Entered and Authorized by:   Tacey Ruiz MD   Signed by:   Tacey Ruiz MD on 01/23/2010   Method used:   Electronically to        [facility] [address] (retail)       [address], [address]       [place], [place]  [address]       Ph: [phone]       Fax: [phone]   RxID:   [id number] ALBUTEROL SULFATE (2.5 MG/3ML) 0.083% NEBU (ALBUTEROL SULFATE) 1 to use in Nebulizer Q4-6 hours as needed for shortness of breath/wheeze  #1 box x 0   Entered and Authorized by:   Tacey Ruiz MD   Signed by:   Tacey Ruiz MD on 01/23/2010    Method used:   Electronically to        [facility] [address] (retail)       [address], [address]       [place], [place]  [address]       Ph: [phone]  Fax: [phone]   RxID:   [id number]   ___________________________________________________________ I have reviewed the above medical office visit documention, including diagnoses, history, medications, clinical lists, orders and plan of care.   Rodney Langton, MD, FAAFP  Jan 23, 2010

## 2010-10-22 NOTE — Letter (Signed)
Summary: Out of Work  [facility]  [address]   [place], [place] [address]   Phone: [phone]  Fax: [phone]    Jan 26, 2010   Employee:  Carol Rivera    To Whom It May Concern:   For Medical reasons, please excuse the above named employee from work for the following dates:  Start:   01/25/2010  End:   01/29/2010  If you need additional information, please feel free to contact our office.         Sincerely,  Standley Dakins MD [facility], [place]

## 2010-10-22 NOTE — Letter (Signed)
Summary: X-ray order  X-ray order   Imported By: Chelsea Aus 01/27/2010 11:46:24  _____________________________________________________________________  External Attachment:    Type:   Image     Comment:   External Document

## 2010-10-22 NOTE — Assessment & Plan Note (Signed)
Summary: NOT ANY BETTER/JBB   Vital Signs:  Patient profile:   20 year old female Menstrual status:  irregular Height:      61 inches Weight:      217 pounds O2 Sat:      98 % on Room air Temp:     98.2 degrees F oral Pulse rate:   88 / minute Resp:     18 per minute BP sitting:   125 / 80  (right arm)  Vitals Entered By: Ashok Norris LPN (Jan 27, 6212 5:03 PM)  O2 Flow:  Room air History of Present Illness History from: patient Reason for visit: see chief complaint Chief Complaint: Cough, SOB, Wheezing History of Present Illness: 20 yo female who showed up at the front desk saying that she wasn't feeling any better. She reported that she was not able to get her medications filledl because they were expensive. she said that she was only able to get the prednisone field. She also said that she was able to get the albuterol filled. she reports that she is short of breath and congested.  She wasn't able to fill prescriptions from 01/23/1010 visit.  Pt c/o cough, congestion, night sweats.  Pt not able to get antibiotics filled.  Pt reports that she has had some SOB.  She did use nebulizers at home. she says that overall she's coughing all the time. Her cough has been nonproductive at this point. She says that she can get her prescriptions filled today if they are from the $4 dollar list at [facility]. She denies having any chest pain today.   the patient reported that she is not currently sexually active. The patient reports that her last menstrual period was 2 weeks ago. The patient reported that she uses condoms with each sexual encounter. The patient reports that she has not been sexually active for at least one month.  Current Problems: ASTHMA UNSPECIFIED WITH EXACERBATION (ICD-493.92) WHEEZING (ICD-786.07) ? of UNSPECIFIED BACTERIAL PNEUMONIA (ICD-482.9) ALLERGIC RHINITIS (ICD-477.9) ASTHMA (ICD-493.90) UPPER RESPIRATORY INFECTION, ACUTE, WITH BRONCHITIS (ICD-465.9) ACUTE  BRONCHOSPASM (ICD-519.11)   Current Meds ALBUTEROL SULFATE (2.5 MG/3ML) 0.083% NEBU (ALBUTEROL SULFATE) 1 to use in Nebulizer Q4-6 hours as needed for shortness of breath/wheeze PREDNISONE 20 MG TABS (PREDNISONE) 1 by mouth two times a day x 4 days DOXYCYCLINE HYCLATE 100 MG CAPS (DOXYCYCLINE HYCLATE) take 1 by mouth two times a day with food IPRATROPIUM BROMIDE 0.02 % SOLN (IPRATROPIUM BROMIDE) mix 1 vial with albuterol for nebulizer treatments every 4 hours as needed sob, wheezing, cough PREDNISONE (PAK) 10 MG TABS (PREDNISONE) take as directed for the 6 day course of treatment LORATADINE 10 MG TABS (LORATADINE) take 1 by mouth daily as needed for allergies VENTOLIN HFA 108 (90 BASE) MCG/ACT AERS (ALBUTEROL SULFATE) 2 puffs q 3 hours as needed wheezing, sob, cough    Preventive Screening-Counseling & Management  Alcohol-Tobacco     Smoking Status: current     Smoking Cessation Counseling: yes      Drug Use:  no.    Problems Prior to Update: 1)  Upper Respiratory Infection, Acute, With Bronchitis  (ICD-465.9) 2)  Acute Bronchospasm  (ICD-519.11)  Past History:  Family History: Last updated: Feb 15, 2010 Father deceased of MI - unknown age  Social History: Last updated: 02/15/10 1/2 ppd x 3 years Current Smoker Alcohol use-no Drug use-no  Risk Factors: Smoking Status: current (Feb 15, 2010)  Past Medical History: Asthma Allergic rhinitis chronic active nicotine dependence Obesity  Past Surgical History:  Dental extractions  Family History: Father deceased of MI - unknown age  Social History: 1/2 ppd x 3 years Current Smoker Alcohol use-no Drug use-no Smoking Status:  current Drug Use:  no  Review of Systems       The patient complains of hoarseness and prolonged cough.  The patient denies anorexia, fever, weight loss, weight gain, vision loss, decreased hearing, chest pain, syncope, dyspnea on exertion, peripheral edema, headaches, hemoptysis, abdominal  pain, melena, hematochezia, severe indigestion/heartburn, hematuria, incontinence, genital sores, muscle weakness, suspicious skin lesions, transient blindness, difficulty walking, depression, unusual weight change, abnormal bleeding, enlarged lymph nodes, angioedema, breast masses, and testicular masses.    Physical Exam  General:  alert, well-developed, well-hydrated, and overweight-appearing.   Head:  normocephalic and atraumatic.   Eyes:  pupils equal, pupils round, and pupils reactive to light.   Ears:  R ear normal and L ear normal.   Nose:  mucosal erythema and mucosal edema.   Mouth:  normal dentition.   tongue ring present Chest Wall:  negative for intercostal retractions.   Lungs:  R base dullness, R diffuse crackles, R wheezes, L diffuse crackles, and L wheezes.   Heart:  normal rate, regular rhythm, no murmur, no gallop, no rub, and no JVD.   Abdomen:  soft and non-tender.   Msk:  normal ROM.   Pulses:  R radial normal and R femoral normal.   Neurologic:  alert & oriented X3 and cranial nerves II-XII intact.   Skin:  turgor normal and color normal.   Psych:  Oriented X3, memory intact for recent and remote, normally interactive, good eye contact, not anxious appearing, and not depressed appearing.     Complete Medication List: 1)  Albuterol Sulfate (2.5 Mg/1ml) 0.083% Nebu (Albuterol sulfate) .Marland Kitchen.. 1 to use in nebulizer q4-6 hours as needed for shortness of breath/wheeze 2)  Prednisone 20 Mg Tabs (Prednisone) .Marland Kitchen.. 1 by mouth two times a day x 4 days 3)  Doxycycline Hyclate 100 Mg Caps (Doxycycline hyclate) .... Take 1 by mouth two times a day with food 4)  Ipratropium Bromide 0.02 % Soln (Ipratropium bromide) .... Mix 1 vial with albuterol for nebulizer treatments every 4 hours as needed sob, wheezing, cough 5)  Prednisone (pak) 10 Mg Tabs (Prednisone) .... Take as directed for the 6 day course of treatment 6)  Loratadine 10 Mg Tabs (Loratadine) .... Take 1 by mouth daily as  needed for allergies 7)  Ventolin Hfa 108 (90 Base) Mcg/act Aers (Albuterol sulfate) .... 2 puffs q 3 hours as needed wheezing, sob, cough  Other Orders: Rocephin  250mg  (U9811) Rocephin  250mg  (B1478) Rocephin  250mg  (G9562) Rocephin  250mg  (Z3086) Nebulizer Tx (57846) Pulse Oximetry (96295) Admin of Injection (IM/SQ) (28413)  Rocephin 1 gram Lot# [id number] M, Exp. Date 08/22/2012, [employer].  Mixed with sterile water 2.1 ml Lot# [id number] Exp. Date 01/15, [employer] Injection #1 given in RUOQ Gluteus Injection#2 given inLUOQ Gluteus Pt. tolerated injections without complications and was monitored for 25 minutes post-injection.  Given by Ma Hillock LPN  Patient Instructions: 1)  go to the emergency department at [facility] and get your chest x-ray done. 2)  Please use the x-ray form that was given to you today in the office. 3)  There is no on-call provider or services available at this clinic during off-hours (when the clinic is closed).  If you develop a problem or concern that requires immediate attention, go the the nearest available urgent  care or emergency department for medical care.   4)  PLEASE STOP SMOKING 5)  Tobacco is very bad for your health and your loved ones! You Should stop smoking!. 6)  mix the albuterol and ipratropium nebulizer solutions together and use as directed every 4-6 hours p.r.n. shortness of breath, wheezing,  severe cough 7)  please followup if no improvement. 8)  Establish care with her primary care provider. Prescriptions: VENTOLIN HFA 108 (90 BASE) MCG/ACT AERS (ALBUTEROL SULFATE) 2 puffs q 3 hours as needed wheezing, sob, cough  #1 x 3   Entered and Authorized by:   Standley Dakins MD   Signed by:   Standley Dakins MD on 01/26/2010   Method used:   Electronically to        [facility] [address] (retail)       [address], [address]       [place], [place]  [address]       Ph:  [phone]       Fax: [phone]   RxID:   [id number] LORATADINE 10 MG TABS (LORATADINE) take 1 by mouth daily as needed for allergies  #30 x 3   Entered and Authorized by:   Standley Dakins MD   Signed by:   Standley Dakins MD on 01/26/2010   Method used:   Electronically to        [facility] [address] ([facility]ail)       [address], [address]       [place], [place]  [address]       Ph: [phone]       Fax: [phone]   RxID:   [id number] PREDNISONE (PAK) 10 MG TABS (PREDNISONE) take as directed for the 6 day course of treatment  #21 tabs x 0   Entered and Authorized by:   Standley Dakins MD   Signed by:   Standley Dakins MD on 01/26/2010   Method used:   Electronically to        [facility] [address] ([facility]ail)       [address], [address]       [place], [place]  [address]       Ph: [phone]       Fax: [phone]   RxID:   [id number] IPRATROPIUM BROMIDE 0.02 % SOLN (IPRATROPIUM BROMIDE) mix 1 vial with albuterol for nebulizer treatments every 4 hours as needed sob, wheezing, cough  #1 box of 25 x 1   Entered and Authorized by:   Standley Dakins MD   Signed by:   Standley Dakins MD on 01/26/2010   Method used:   Electronically to        [facility] [address] ([facility]ail)       [address], [address]       [place], [place]  [address]       Ph: [phone]       Fax: [phone]   RxID:   [id number] DOXYCYCLINE HYCLATE 100 MG CAPS (DOXYCYCLINE HYCLATE) take 1 by mouth two times a day with food  #20 x 0   Entered and Authorized by:   Standley Dakins MD   Signed by:   Standley Dakins MD on 01/26/2010   Method used:   Electronically to  [facility] [address]ail)       [address]       [place], [place]  [address]       Ph: [phone]       Fax: [phone]   RxID:   [id number]   I  spent more than 65 minutes with the patient today because she was so ill when she arrived. She had severe wheezing. She was short of breath with crackles in the lungs. We gave her a nebulizer treatment of Duoneb.  The DuoNeb treatment did help significantly. after the treatment her lungs opened up and she was able to breathe better.  the patient tolerated the treatments very well. the patient was strongly counseled to avoid nicotine and tobacco use altogether.  after being treated in the clinic the patient was sent to get a chest x-ray PA and lateral. Because that is the weekend the patient will have to go to the emergency department. I did give her a sign order so that she could have her x-ray performed and the results faxed to the clinic. The patient was told that she needed to establish care with a primary care physician.  The risks, benefits and possible side effects were clearly explained and discussed with the patient.  The patient verbalized clear understanding.  The patient was given instructions to return if symptoms don't improve, worsen or new changes develop.  If it is not during clinic hours and the patient cannot get back to this clinic then the patient was told to seek medical care at an available urgent care or emergency department.  The patient verbalized understanding.    The patient was informed that there is no on-call provider or services available at this clinic during off-hours (when the clinic is closed).  If the patient developed a problem or concern that required immediate attention, the patient was advised to go the the nearest available urgent care or emergency department for medical care.  The patient verbalized understanding.     The patient's prescriptions were checked for possible interactions and electronically sent to the pharmacy of choice.    I have reviewed the above medical office visit documention, including diagnoses, history, medications, clinical lists, orders and  plan of care.   Rodney Langton, MD, FAAFP  Jan 26, 2010

## 2011-06-20 LAB — POCT URINALYSIS DIP (DEVICE)
Glucose, UA: NEGATIVE
Hgb urine dipstick: NEGATIVE
Ketones, ur: NEGATIVE
Protein, ur: NEGATIVE
Specific Gravity, Urine: 1.02
Urobilinogen, UA: 1

## 2011-06-23 LAB — POCT URINALYSIS DIP (DEVICE)
Bilirubin Urine: NEGATIVE
Glucose, UA: NEGATIVE
Glucose, UA: NEGATIVE
Hgb urine dipstick: NEGATIVE
Ketones, ur: NEGATIVE
Ketones, ur: NEGATIVE
Operator id: 15968
Protein, ur: NEGATIVE
Specific Gravity, Urine: 1.02
Specific Gravity, Urine: <=1.005
Urobilinogen, UA: 0.2

## 2011-06-23 LAB — CBC
HCT: 38.4
Hemoglobin: 12.7
MCV: 78.8
RBC: 4.87
WBC: 15.2 — ABNORMAL HIGH

## 2011-06-23 LAB — URINALYSIS, ROUTINE W REFLEX MICROSCOPIC
Glucose, UA: NEGATIVE
Hgb urine dipstick: NEGATIVE
Protein, ur: NEGATIVE
pH: 7

## 2011-06-25 LAB — POCT URINALYSIS DIP (DEVICE)
Glucose, UA: NEGATIVE
Hgb urine dipstick: NEGATIVE
Ketones, ur: NEGATIVE
Nitrite: NEGATIVE
Specific Gravity, Urine: 1.025
pH: 6

## 2011-10-03 ENCOUNTER — Emergency Department: Payer: Self-pay | Admitting: Unknown Physician Specialty

## 2012-02-24 ENCOUNTER — Encounter (HOSPITAL_COMMUNITY): Payer: Self-pay | Admitting: Emergency Medicine

## 2012-02-24 ENCOUNTER — Emergency Department (HOSPITAL_COMMUNITY)
Admission: EM | Admit: 2012-02-24 | Discharge: 2012-02-25 | Disposition: A | Discharge status: Home / Self Care | Payer: Self-pay | Attending: Emergency Medicine | Admitting: Emergency Medicine

## 2012-02-24 DIAGNOSIS — F17200 Nicotine dependence, unspecified, uncomplicated: Secondary | ICD-10-CM | POA: Insufficient documentation

## 2012-02-24 DIAGNOSIS — N949 Unspecified condition associated with female genital organs and menstrual cycle: Secondary | ICD-10-CM | POA: Insufficient documentation

## 2012-02-24 DIAGNOSIS — N938 Other specified abnormal uterine and vaginal bleeding: Secondary | ICD-10-CM | POA: Insufficient documentation

## 2012-02-24 DIAGNOSIS — N390 Urinary tract infection, site not specified: Secondary | ICD-10-CM

## 2012-02-24 LAB — URINE MICROSCOPIC-ADD ON

## 2012-02-24 LAB — URINALYSIS, ROUTINE W REFLEX MICROSCOPIC
Bilirubin Urine: NEGATIVE
Nitrite: NEGATIVE
Specific Gravity, Urine: 1.02 (ref 1.005–1.030)
Urobilinogen, UA: 0.2 mg/dL (ref 0.0–1.0)
pH: 6 (ref 5.0–8.0)

## 2012-02-24 LAB — POCT PREGNANCY, URINE: Preg Test, Ur: NEGATIVE

## 2012-02-24 MED ORDER — IBUPROFEN 800 MG PO TABS
800.0000 mg | ORAL_TABLET | Freq: Once | ORAL | Status: AC
  Administered 2012-02-25: 800 mg via ORAL
  Filled 2012-02-24: qty 1

## 2012-02-24 NOTE — ED Notes (Signed)
Patient states she had a period on May 13 and ended on May 16. Started bleeding again on May 24 and is still currently bleeding at this time. Also complaining of left lower abdominal pain.

## 2012-02-24 NOTE — ED Provider Notes (Signed)
History  This chart was scribed for [employer]. Colon Branch, MD by Bennett Scrape. This patient was seen in room APA08/APA08 and the patient's care was started at 11:11PM.  CSN: [id number]  Arrival date & time 02/24/12  2229   First MD Initiated Contact with Patient 02/24/12 2311      Chief Complaint  Patient presents with  . Vaginal Bleeding    The history is provided by the patient. No language interpreter was used.    Carol Rivera is a 21 y.o. female with a h/o asthma who presents to the Emergency Department complaining of having her menstral cycle twice this month. Pt states that the first menstrual cycle started on May 13 and ended on May 16. She states that she started bleeding again on May 24 and is still currently bleeding at this time, totaling 13 days. She states that the first period was shorter than normal. She states that the normal menstrual cycle lasts 4 to 5 days. She is also complaining of increased left lower abdominal pain described as cramping and increased blood clots. She reports similar episodes of vaginal bleeding but not this long. She also states that she has been having left suprapubic abdominal swelling for the past month. She is not on any form of birth control. She denies any other symptoms currently. She is a current everyday smoker but denies alcohol use.  She is a current everyday smoker but denies alcohol use.   Past Medical History  Diagnosis Date  . Asthma     History reviewed. No pertinent past surgical history.  History reviewed. No pertinent family history.  History  Substance Use Topics  . Smoking status: Current Everyday Smoker -- 1.0 packs/day  . Smokeless tobacco: Not on file  . Alcohol Use: No     Review of Systems  A complete 10 system review of systems was obtained and all systems are negative except as noted in the HPI and PMH.    Allergies  Doxycycline  Home Medications   Current Outpatient Rx  Name Route Sig Dispense  Refill  . FERROUS SULFATE 325 (65 FE) MG PO TABS Oral Take 650 mg by mouth daily.      Triage Vitals: BP 138/95  Pulse 117  Temp(Src) 98.5 F (36.9 C) (Oral)  Resp 16  Ht 5\' 2"  (1.575 m)  Wt 190 lb (86.183 kg)  BMI 34.75 kg/m2  SpO2 100%  LMP 02/13/2012  Physical Exam  Nursing note and vitals reviewed. Constitutional: She is oriented to person, place, and time. She appears well-developed and well-nourished. No distress.  HENT:  Head: Normocephalic and atraumatic.  Eyes: Conjunctivae and EOM are normal.  Neck: Neck supple. No tracheal deviation present.  Cardiovascular: Normal rate and regular rhythm.  Exam reveals no gallop and no friction rub.   No murmur heard. Pulmonary/Chest: Effort normal and breath sounds normal. No respiratory distress. She has no wheezes. She has no rales.  Abdominal: There is tenderness (RLQ discomfort, rght suprapubic tenderness ). There is no guarding.  Musculoskeletal: Normal range of motion.  Neurological: She is alert and oriented to person, place, and time.  Skin: Skin is warm and dry.  Psychiatric: She has a normal mood and affect. Her behavior is normal.    ED Course  Procedures (including critical care time)  DIAGNOSTIC STUDIES: Oxygen Saturation is 100% on room air, normal by my interpretation.    COORDINATION OF CARE: 11:33PM-Discussed discharge plan with pt and pt agreed to plan. Advised pt  to see an OBGYN.   Results for orders placed during the hospital encounter of 02/24/12  URINALYSIS, ROUTINE W REFLEX MICROSCOPIC      Component Value Range   Color, Urine YELLOW  YELLOW    APPearance CLOUDY (*) CLEAR    Specific Gravity, Urine 1.020  1.005 - 1.030    pH 6.0  5.0 - 8.0    Glucose, UA NEGATIVE  NEGATIVE (mg/dL)   Hgb urine dipstick TRACE (*) NEGATIVE    Bilirubin Urine NEGATIVE  NEGATIVE    Ketones, ur NEGATIVE  NEGATIVE (mg/dL)   Protein, ur NEGATIVE  NEGATIVE (mg/dL)   Urobilinogen, UA 0.2  0.0 - 1.0 (mg/dL)   Nitrite  NEGATIVE  NEGATIVE    Leukocytes, UA SMALL (*) NEGATIVE   POCT PREGNANCY, URINE      Component Value Range   Preg Test, Ur NEGATIVE  NEGATIVE   URINE MICROSCOPIC-ADD ON      Component Value Range   Squamous Epithelial / LPF FEW (*) RARE    WBC, UA 21-50  <3 (WBC/hpf)   RBC / HPF 0-2  <3 (RBC/hpf)   Bacteria, UA FEW (*) RARE    Urine-Other FEW YEAST          MDM  Patient who presents with recurrent vaginal bleeding c/w dysfunctional uterine bleeding. In addition she has a urinary tract infection. Antibiotics initiated. Referral to OB/GYN. Pt stable in ED with no significant deterioration in condition.The patient appears reasonably screened and/or stabilized for discharge and I doubt any other medical condition or other [facility] requiring further screening, evaluation, or treatment in the ED at this time prior to discharge.  I personally performed the services described in this documentation, which was scribed in my presence. The recorded information has been reviewed and considered.   MDM Reviewed: nursing note and vitals Interpretation: labs           Nicoletta Dress. Colon Branch, MD 03/07/12 5784

## 2012-02-25 ENCOUNTER — Encounter (HOSPITAL_COMMUNITY): Payer: Self-pay | Admitting: *Deleted

## 2012-02-25 ENCOUNTER — Emergency Department (HOSPITAL_COMMUNITY)
Admission: EM | Admit: 2012-02-25 | Discharge: 2012-02-26 | Disposition: A | Discharge status: Home / Self Care | Payer: Self-pay | Attending: Emergency Medicine | Admitting: Emergency Medicine

## 2012-02-25 DIAGNOSIS — L293 Anogenital pruritus, unspecified: Secondary | ICD-10-CM | POA: Insufficient documentation

## 2012-02-25 DIAGNOSIS — F17200 Nicotine dependence, unspecified, uncomplicated: Secondary | ICD-10-CM | POA: Insufficient documentation

## 2012-02-25 DIAGNOSIS — L739 Follicular disorder, unspecified: Secondary | ICD-10-CM

## 2012-02-25 MED ORDER — SULFAMETHOXAZOLE-TRIMETHOPRIM 800-160 MG PO TABS: 1.0000 | ORAL_TABLET | Freq: Two times a day (BID) | ORAL | Status: AC

## 2012-02-25 MED ORDER — SULFAMETHOXAZOLE-TRIMETHOPRIM 200-40 MG/5ML PO SUSP
ORAL | Status: AC
  Filled 2012-02-25: qty 120

## 2012-02-25 MED ORDER — SULFAMETHOXAZOLE-TRIMETHOPRIM 200-40 MG/5ML PO SUSP
20.0000 mL | Freq: Once | ORAL | Status: AC
  Administered 2012-02-25: 20 mL via ORAL

## 2012-02-25 NOTE — ED Notes (Signed)
Vaginal itching

## 2012-02-25 NOTE — ED Notes (Signed)
Contacted ac regarding patient's ordered antibiotic. Notified her that it is not coming up in either ed pyxis.

## 2012-02-25 NOTE — Discharge Instructions (Signed)
Your skin condition is due to shaving with a dull razor and taking off the tops of the hair follicles. You may use a cream or the diaper rash creams like A&D ointment. Do not wear panties to bed allowing air to circulate. It will improve the healing time.   Folliculitis  Folliculitis is an infection and inflammation of the hair follicles. Hair follicles become red and irritated. This inflammation is usually caused by bacteria. The bacteria thrive in warm, moist environments. This condition can be seen anywhere on the body.  CAUSES The most common cause of folliculitis is an infection by germs (bacteria). Fungal and viral infections can also cause the condition. Viral infections may be more common in people whose bodies are unable to fight disease well (weakened immune systems). Examples include people with:  AIDS.   An organ transplant.   Cancer.  People with depressed immune systems, diabetes, or obesity, have a greater risk of getting folliculitis than the general population. Certain chemicals, especially oils and tars, also can cause folliculitis. SYMPTOMS  An early sign of folliculitis is a small, white or yellow pus-filled, itchy lesion (pustule). These lesions appear on a red, inflamed follicle. They are usually less than 5 mm (.20 inches).   The most likely starting points are the scalp, thighs, legs, back and buttocks. Folliculitis is also frequently found in areas of repeated shaving.   When an infection of the follicle goes deeper, it becomes a boil or furuncle. A group of closely packed boils create a larger lesion (a carbuncle). These sores (lesions) tend to occur in hairy, sweaty areas of the body.  TREATMENT   A doctor who specializes in skin problems (dermatologists) treats mild cases of folliculitis with antiseptic washes.   They also use a skin application which kills germs (topical antibiotics). Tea tree oil is a good topical antiseptic as well. It can be found at a health  food store. A small percentage of individuals may develop an allergy to the tea tree oil.   Mild to moderate boils respond well to warm water compresses applied three times daily.   In some cases, oral antibiotics should be taken with the skin treatment.   If lesions contain large quantities of pus or fluid, your caregiver may drain them. This allows the topical antibiotics to get to the affected areas better.   Stubborn cases of folliculitis may respond to laser hair removal. This process uses a high intensity light beam (a laser) to destroy the follicle and reduces the scarring from folliculitis. After laser hair removal, hair will no longer grow in the laser treated area.  Patients with long-lasting folliculitis need to find out where the infection is coming from. Germs can live in the nostrils of the patient. This can trigger an outbreak now and then. Sometimes the bacteria live in the nostrils of a family member. This person does not develop the disorder but they repeatedly re-expose others to the germ. To break the cycle of recurrence in the patient, the family member must also undergo treatment. PREVENTION   Individuals who are predisposed to folliculitis should be extremely careful about personal hygiene.   Application of antiseptic washes may help prevent recurrences.   A topical antibiotic cream, mupirocin (Bactroban), has been effective at reducing bacteria in the nostrils. It is applied inside the nose with your little finger. This is done twice daily for a week. Then it is repeated every 6 months.   Because follicle disorders tend to come back,  patients must receive follow-up care. Your caregiver may be able to recognize a recurrence before it becomes severe.  SEEK IMMEDIATE MEDICAL CARE IF:   You develop redness, swelling, or increasing pain in the area.   You have a fever.   You are not improving with treatment or are getting worse.   You have any other questions or  concerns.  Document Released: 11/17/2001 Document Revised: 08/28/2011 Document Reviewed: 09/13/2008 [facility] Patient Information 2012 [place], [place].

## 2012-02-25 NOTE — ED Provider Notes (Signed)
History  This chart was scribed for [employer]. Colon Branch, MD by Stevphen Meuse. This patient was seen in room APA15/APA15 and the patient's care was started at 11:32PM.  CSN: [id number]  Arrival date & time 02/25/12  2247   First MD Initiated Contact with Patient 02/25/12 2309      Chief Complaint  Patient presents with  . Vaginal Itching    (Consider location/radiation/quality/duration/timing/severity/associated sxs/prior treatment) The history is provided by the patient. No language interpreter was used.  Carol Rivera is a 21 y.o. female who presents to the Emergency Department complaining of gradual onset, gradually worsening,  vaginal itching with associated rash. Pt states that she believes that the rash is attributed to shaving. Pt states that she used a dull razor while shaving yesterday. She denies taking any medication to relieve her current symptoms and denies any modifying factors. Pt denies fever, abdominal pain, vaginal discharge, back pain, HA, as associated symptoms. Pt has a h/o asthma. Pt is a current everyday smoker but denies a h/o alcohol use.  Past Medical History  Diagnosis Date  . Asthma     History reviewed. No pertinent past surgical history.  No family history on file.  History  Substance Use Topics  . Smoking status: Current Everyday Smoker -- 1.0 packs/day  . Smokeless tobacco: Not on file  . Alcohol Use: No    OB History    Grav Para Term Preterm Abortions TAB SAB Ect Mult Living                  Review of Systems  Constitutional: Negative for fever.  Genitourinary: Negative for vaginal discharge and vaginal pain.  Skin: Positive for rash.  All other systems reviewed and are negative.   A complete 10 system review of systems was obtained and all systems are negative except as noted in the HPI and PMH.   Allergies  Doxycycline  Home Medications   Current Outpatient Rx  Name Route Sig Dispense Refill  . FERROUS SULFATE 325 (65 FE) MG  PO TABS Oral Take 650 mg by mouth daily.    . SULFAMETHOXAZOLE-TRIMETHOPRIM 800-160 MG PO TABS Oral Take 1 tablet by mouth 2 (two) times daily. 28 tablet 0    BP 146/88  Pulse 119  Temp(Src) 98.7 F (37.1 C) (Oral)  Resp 20  Ht 5\' 2"  (1.575 m)  Wt 199 lb 2 oz (90.323 kg)  BMI 36.42 kg/m2  SpO2 100%  LMP 02/24/2012  Physical Exam  Nursing note and vitals reviewed. Constitutional: She is oriented to person, place, and time. She appears well-developed and well-nourished. No distress.  HENT:  Head: Normocephalic and atraumatic.  Eyes: Conjunctivae and EOM are normal.  Neck: Normal range of motion. Neck supple.  Cardiovascular: Normal rate, regular rhythm and normal heart sounds.   Pulmonary/Chest: Effort normal and breath sounds normal.  Abdominal: Soft. Bowel sounds are normal.  Genitourinary:       Clean shaven pubis and mons with folliculitis   Musculoskeletal: Normal range of motion.  Neurological: She is alert and oriented to person, place, and time.  Skin: Skin is warm and dry.    ED Course  Procedures (including critical care time) DIAGNOSTIC STUDIES: Oxygen Saturation is 100% on room air, normal by my interpretation.    COORDINATION OF CARE:  11:33PM Advised pt to use ointment to relieve her current symptoms     MDM  Patient with itching and rash to perineal area after having shaved with a dull  razor. Physical exam with an irritative folliculitis. Reviewed hygiene issues with the patient.Pt stable in ED with no significant deterioration in condition.The patient appears reasonably screened and/or stabilized for discharge and I doubt any other medical condition or other [facility] requiring further screening, evaluation, or treatment in the ED at this time prior to discharge.  I personally performed the services described in this documentation, which was scribed in my presence. The recorded information has been reviewed and considered.  MDM Reviewed: nursing note and  vitals            Nicoletta Dress. Colon Branch, MD 02/26/12 [phone]

## 2012-02-25 NOTE — Discharge Instructions (Signed)
Allow this episode of bleeding to finish. Wait a month to see if your next period is as heavy as this one. Follow up with an OB/GYN. You may apply heat, use tylenol or ibuprofen for discomfort.   Abnormal Uterine Bleeding Abnormal uterine bleeding can have many causes. Some cases are simply treated, while others are more serious. There are several kinds of bleeding that is considered abnormal, including:  Bleeding between periods.   Bleeding after sexual intercourse.   Spotting anytime in the menstrual cycle.   Bleeding heavier or more than normal.   Bleeding after menopause.  CAUSES  There are many causes of abnormal uterine bleeding. It can be present in teenagers, pregnant women, women during their reproductive years, and women who have reached menopause. Your caregiver will look for the more common causes depending on your age, signs, symptoms and your particular circumstance. Most cases are not serious and can be treated. Even the more serious causes, like cancer of the female organs, can be treated adequately if found in the early stages. That is why all types of bleeding should be evaluated and treated as soon as possible. DIAGNOSIS  Diagnosing the cause may take several kinds of tests. Your caregiver may:  Take a complete history of the type of bleeding.   Perform a complete physical exam and Pap smear.   Take an ultrasound on the abdomen showing a picture of the female organs and the pelvis.   Inject dye into the uterus and Fallopian tubes and X-ray them (hysterosalpingogram).   Place fluid in the uterus and do an ultrasound (sonohysterogrqphy).   Take a CT scan to examine the female organs and pelvis.   Take an MRI to examine the female organs and pelvis. There is no X-ray involved with this procedure.   Look inside the uterus with a telescope that has a light at the end (hysteroscopy).   Scrap the inside of the uterus to get tissue to examine (Dilatation and Curettage,  D&C).   Look into the pelvis with a telescope that has a light at the end (laparoscopy). This is done through a very small cut (incision) in the abdomen.  TREATMENT  Treatment will depend on the cause of the abnormal bleeding. It can include:  Doing nothing to allow the problem to take care of itself over time.   Hormone treatment.   Birth control pills.   Treating the medical condition causing the problem.   Laparoscopy.   Major or minor surgery   Destroying the lining of the uterus with electrical currant, laser, freezing or heat (uterine ablation).  HOME CARE INSTRUCTIONS   Follow your caregiver's recommendation on how to treat your problem.   See your caregiver if you missed a menstrual period and think you may be pregnant.   If you are bleeding heavily, count the number of pads/tampons you use and how often you have to change them. Tell this to your caregiver.   Avoid sexual intercourse until the problem is controlled.  SEEK MEDICAL CARE IF:   You have any kind of abnormal bleeding mentioned above.   You feel dizzy at times.   You are 21 years old and have not had a menstrual period yet.  SEEK IMMEDIATE MEDICAL CARE IF:   You pass out.   You are changing pads/tampons every 15 to 30 minutes.   You have belly (abdominal) pain.   You have a temperature of 100 F (37.8 C) or higher.   You become sweaty  or weak.   You are passing large blood clots from the vagina.   You start to feel sick to your stomach (nauseous) and throw up (vomit).  Document Released: 09/08/2005 Document Revised: 08/28/2011 Document Reviewed: 02/01/2009 [facility] Patient Information 2012 [place], [place].

## 2015-11-30 ENCOUNTER — Emergency Department (HOSPITAL_COMMUNITY)
Admission: EM | Admit: 2015-11-30 | Discharge: 2015-12-01 | Disposition: A | Discharge status: Home / Self Care | Payer: Medicaid Other | Attending: Emergency Medicine | Admitting: Emergency Medicine

## 2015-11-30 ENCOUNTER — Encounter (HOSPITAL_COMMUNITY): Payer: Self-pay | Admitting: *Deleted

## 2015-11-30 DIAGNOSIS — R112 Nausea with vomiting, unspecified: Secondary | ICD-10-CM | POA: Diagnosis present

## 2015-11-30 DIAGNOSIS — J45901 Unspecified asthma with (acute) exacerbation: Secondary | ICD-10-CM | POA: Diagnosis not present

## 2015-11-30 DIAGNOSIS — R197 Diarrhea, unspecified: Secondary | ICD-10-CM | POA: Diagnosis not present

## 2015-11-30 DIAGNOSIS — J069 Acute upper respiratory infection, unspecified: Secondary | ICD-10-CM | POA: Diagnosis not present

## 2015-11-30 DIAGNOSIS — Z79899 Other long term (current) drug therapy: Secondary | ICD-10-CM | POA: Insufficient documentation

## 2015-11-30 DIAGNOSIS — F17200 Nicotine dependence, unspecified, uncomplicated: Secondary | ICD-10-CM | POA: Insufficient documentation

## 2015-11-30 LAB — COMPREHENSIVE METABOLIC PANEL
ALT: 13 U/L — AB (ref 14–54)
ANION GAP: 10 (ref 5–15)
AST: 17 U/L (ref 15–41)
Albumin: 4.2 g/dL (ref 3.5–5.0)
Alkaline Phosphatase: 64 U/L (ref 38–126)
BUN: 11 mg/dL (ref 6–20)
CHLORIDE: 104 mmol/L (ref 101–111)
CO2: 28 mmol/L (ref 22–32)
CREATININE: 0.66 mg/dL (ref 0.44–1.00)
Calcium: 9.3 mg/dL (ref 8.9–10.3)
GFR calc non Af Amer: >60 mL/min (ref >60)
Glucose, Bld: 110 mg/dL — ABNORMAL HIGH (ref 65–99)
POTASSIUM: 3.5 mmol/L (ref 3.5–5.1)
SODIUM: 142 mmol/L (ref 135–145)
Total Bilirubin: 0.9 mg/dL (ref 0.3–1.2)
Total Protein: 8 g/dL (ref 6.5–8.1)

## 2015-11-30 LAB — URINALYSIS, ROUTINE W REFLEX MICROSCOPIC
BILIRUBIN URINE: NEGATIVE
Glucose, UA: NEGATIVE mg/dL
KETONES UR: NEGATIVE mg/dL
Leukocytes, UA: NEGATIVE
NITRITE: NEGATIVE
PH: 5.5 (ref 5.0–8.0)
Protein, ur: NEGATIVE mg/dL

## 2015-11-30 LAB — CBC WITH DIFFERENTIAL/PLATELET
Basophils Absolute: 0 10*3/uL (ref 0.0–0.1)
Basophils Relative: 0 %
EOS ABS: 0.2 10*3/uL (ref 0.0–0.7)
EOS PCT: 1 %
HCT: 42.2 % (ref 36.0–46.0)
Hemoglobin: 14.3 g/dL (ref 12.0–15.0)
LYMPHS ABS: 2.4 10*3/uL (ref 0.7–4.0)
LYMPHS PCT: 17 %
MCH: 28 pg (ref 26.0–34.0)
MCHC: 33.9 g/dL (ref 30.0–36.0)
MCV: 82.6 fL (ref 78.0–100.0)
MONO ABS: 1 10*3/uL (ref 0.1–1.0)
Monocytes Relative: 7 %
Neutro Abs: 10.5 10*3/uL — ABNORMAL HIGH (ref 1.7–7.7)
Neutrophils Relative %: 75 %
PLATELETS: 183 10*3/uL (ref 150–400)
RBC: 5.11 MIL/uL (ref 3.87–5.11)
RDW: 13.4 % (ref 11.5–15.5)
WBC: 14 10*3/uL — AB (ref 4.0–10.5)

## 2015-11-30 LAB — URINE MICROSCOPIC-ADD ON

## 2015-11-30 LAB — PREGNANCY, URINE: Preg Test, Ur: NEGATIVE

## 2015-11-30 LAB — LIPASE, BLOOD: LIPASE: 18 U/L (ref 11–51)

## 2015-11-30 NOTE — ED Notes (Signed)
Patient ambulatory to room, smiling and laughing with s/o.

## 2015-11-30 NOTE — ED Provider Notes (Signed)
CSN: [id number]     Arrival date & time 11/30/15  2018 History   None    Chief Complaint  Patient presents with  . Nausea     (Consider location/radiation/quality/duration/timing/severity/associated sxs/prior Treatment) HPI   Carol Rivera with a PMH of asthma was in a normal state of health until the 3/1 when she felt nauseous, fatigued, productive cough, runny nose, myalgias, headaches, and diminished appetite. She did not get her flu shot. She was able to eat a few french fries today ([employer]), which is an improvement from prior. She feels her symptoms are improving overall. She continues to smoke 15 cig/day. She denies sick contacts.   Past Medical History  Diagnosis Date  . Asthma    History reviewed. No pertinent past surgical history. History reviewed. No pertinent family history. Social History  Substance Use Topics  . Smoking status: Current Every Day Smoker -- 1.00 packs/day  . Smokeless tobacco: None  . Alcohol Use: No   OB History    No data available     Review of Systems  Constitutional: Positive for fatigue. Negative for fever, chills and unexpected weight change.  HENT: Positive for congestion and postnasal drip. Negative for sore throat.   Respiratory: Positive for cough and wheezing. Negative for shortness of breath.   Cardiovascular: Negative for chest pain, palpitations and leg swelling.  Gastrointestinal: Positive for nausea, vomiting and diarrhea. Negative for abdominal pain.  Endocrine: Negative for polydipsia and polyuria.  Genitourinary: Negative for dysuria and urgency.  Musculoskeletal: Positive for myalgias. Negative for neck pain and neck stiffness.  Neurological: Positive for light-headedness and headaches. Negative for dizziness and weakness.  Psychiatric/Behavioral: Negative for dysphoric mood. The patient is not nervous/anxious.       Allergies  Doxycycline  Home Medications   Prior to Admission medications   Medication Sig Start Date  End Date Taking? Authorizing Provider  DM-Phenylephrine-Acetaminophen (ROBITUSSIN COLD+FLU DAYTIME) 10-5-325 MG CAPS Take 2 capsules by mouth once as needed (for cough and cold symptoms).   Yes Historical Provider, MD  benzonatate (TESSALON) 100 MG capsule Take 1 capsule (100 mg total) by mouth 3 (three) times daily as needed for cough. 12/01/15   Kristen N Ward, DO  ondansetron (ZOFRAN ODT) 4 MG disintegrating tablet Take 1 tablet (4 mg total) by mouth every 8 (eight) hours as needed for nausea or vomiting. 12/01/15   Kristen N Ward, DO  predniSONE (DELTASONE) 20 MG tablet Take 3 tablets (60 mg total) by mouth daily. 12/01/15   Kristen N Ward, DO   BP 135/80 mmHg  Pulse 84  Temp(Src) 98.5 F (36.9 C) (Oral)  Resp 16  Ht  (1.549 m)  Wt 175 lb (79.379 kg)  BMI 33.08 kg/m2  SpO2 99%  LMP 11/26/2015 Physical Exam  Constitutional: She appears well-developed and well-nourished. No distress.  HENT:  Mouth/Throat: Oropharynx is clear and moist. No oropharyngeal exudate.  Eyes: Conjunctivae are normal. Pupils are equal, round, and reactive to light. No scleral icterus.  Neck: Normal range of motion. Neck supple.  Cardiovascular: Regular rhythm and normal heart sounds.   No murmur heard. Initially tachycardic but resolved  Pulmonary/Chest: Effort normal. She has wheezes. She has no rales.  Scattered wheezes  Abdominal: Soft. Bowel sounds are normal. She exhibits no distension. There is no tenderness.  Musculoskeletal: She exhibits no edema or tenderness.  Lymphadenopathy:    She has no cervical adenopathy.  Neurological: She is alert. She has normal reflexes.  Skin: Skin is warm and  dry. She is not diaphoretic.  Psychiatric: She has a normal mood and affect. Her behavior is normal.  Nursing note and vitals reviewed.   ED Course  Procedures (including critical care time) Labs Review Labs Reviewed  URINALYSIS, ROUTINE W REFLEX MICROSCOPIC (NOT AT [facility]) - Abnormal; Notable for the  following:    Specific Gravity, Urine <1.005 (*)    Hgb urine dipstick SMALL (*)    All other components within normal limits  CBC WITH DIFFERENTIAL/PLATELET - Abnormal; Notable for the following:    WBC 14.0 (*)    Neutro Abs 10.5 (*)    All other components within normal limits  COMPREHENSIVE METABOLIC PANEL - Abnormal; Notable for the following:    Glucose, Bld 110 (*)    ALT 13 (*)    All other components within normal limits  URINE MICROSCOPIC-ADD ON - Abnormal; Notable for the following:    Squamous Epithelial / LPF 0-5 (*)    Bacteria, UA RARE (*)    All other components within normal limits  PREGNANCY, URINE  LIPASE, BLOOD    Imaging Review Dg Chest 2 View  12/01/2015  CLINICAL DATA:  Cough, wheezing and shortness of breath. Subjective shortness of breath. Symptoms for 1 week. EXAM: CHEST  2 VIEW COMPARISON:  01/27/2010 FINDINGS: The cardiomediastinal contours are normal. There is bronchial thickening most severe in the lower lobes. Pulmonary vasculature is normal. No consolidation, pleural effusion, or pneumothorax. No acute osseous abnormalities are seen. IMPRESSION: Lower lobe bronchial thickening, acute bronchitis versus asthma. Electronically Signed   By: Rubye OaksMelanie  Ehinger M.D.   On: 12/01/2015 01:32   I have personally reviewed and evaluated these images and lab results as part of my medical decision-making.   EKG Interpretation None      MDM   Final diagnoses:  URI (upper respiratory infection)  Asthma exacerbation  Nausea vomiting and diarrhea   Patient's symptoms have improved significantly. Pt with mild leukocytosis to 14, but no hypoxia or CXR infiltrate. She is able to tolerate po fluids. It is likely she is convalescing from a URI, likely viral etiology. Discharged with zofran, prednisone, instructions with return precautions. She verbalizes understanding and is comfortable with the plan.    Ruben ImJeremy Nahara Dona, MD 12/01/15 1221

## 2015-11-30 NOTE — ED Notes (Addendum)
Pt c/o nausea, hot flashes, chest, abdominal pain and cough x 2 weeks.

## 2015-12-01 ENCOUNTER — Emergency Department (HOSPITAL_COMMUNITY): Payer: Medicaid Other

## 2015-12-01 IMAGING — DX DG CHEST 2V
2 series · 2 of 2 positions shown · non-contrast
Comparison: [DATE]

CLINICAL DATA: Cough, wheezing and shortness of breath. Subjective
shortness of breath. Symptoms for 1 week.

EXAM:
CHEST  2 VIEW

[chest pa]
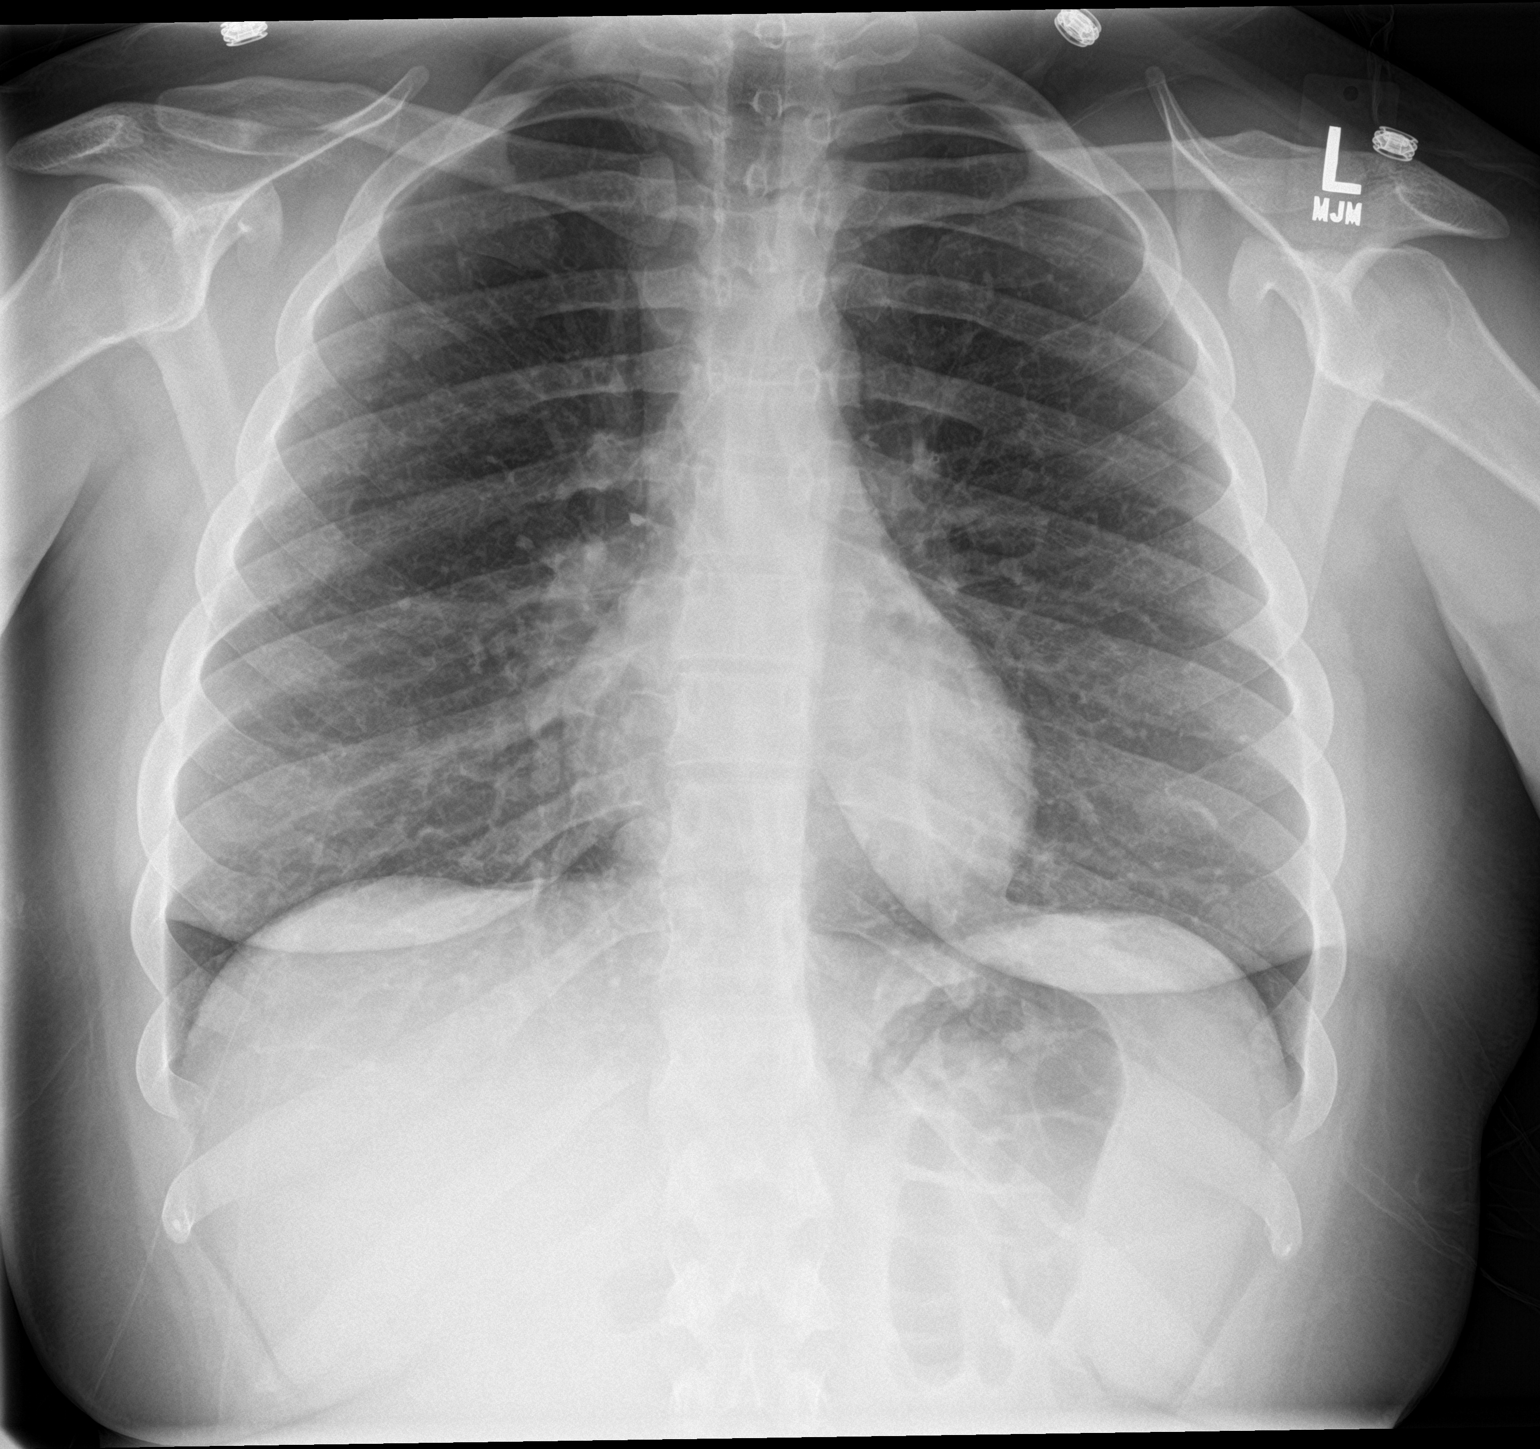

[chest lat]
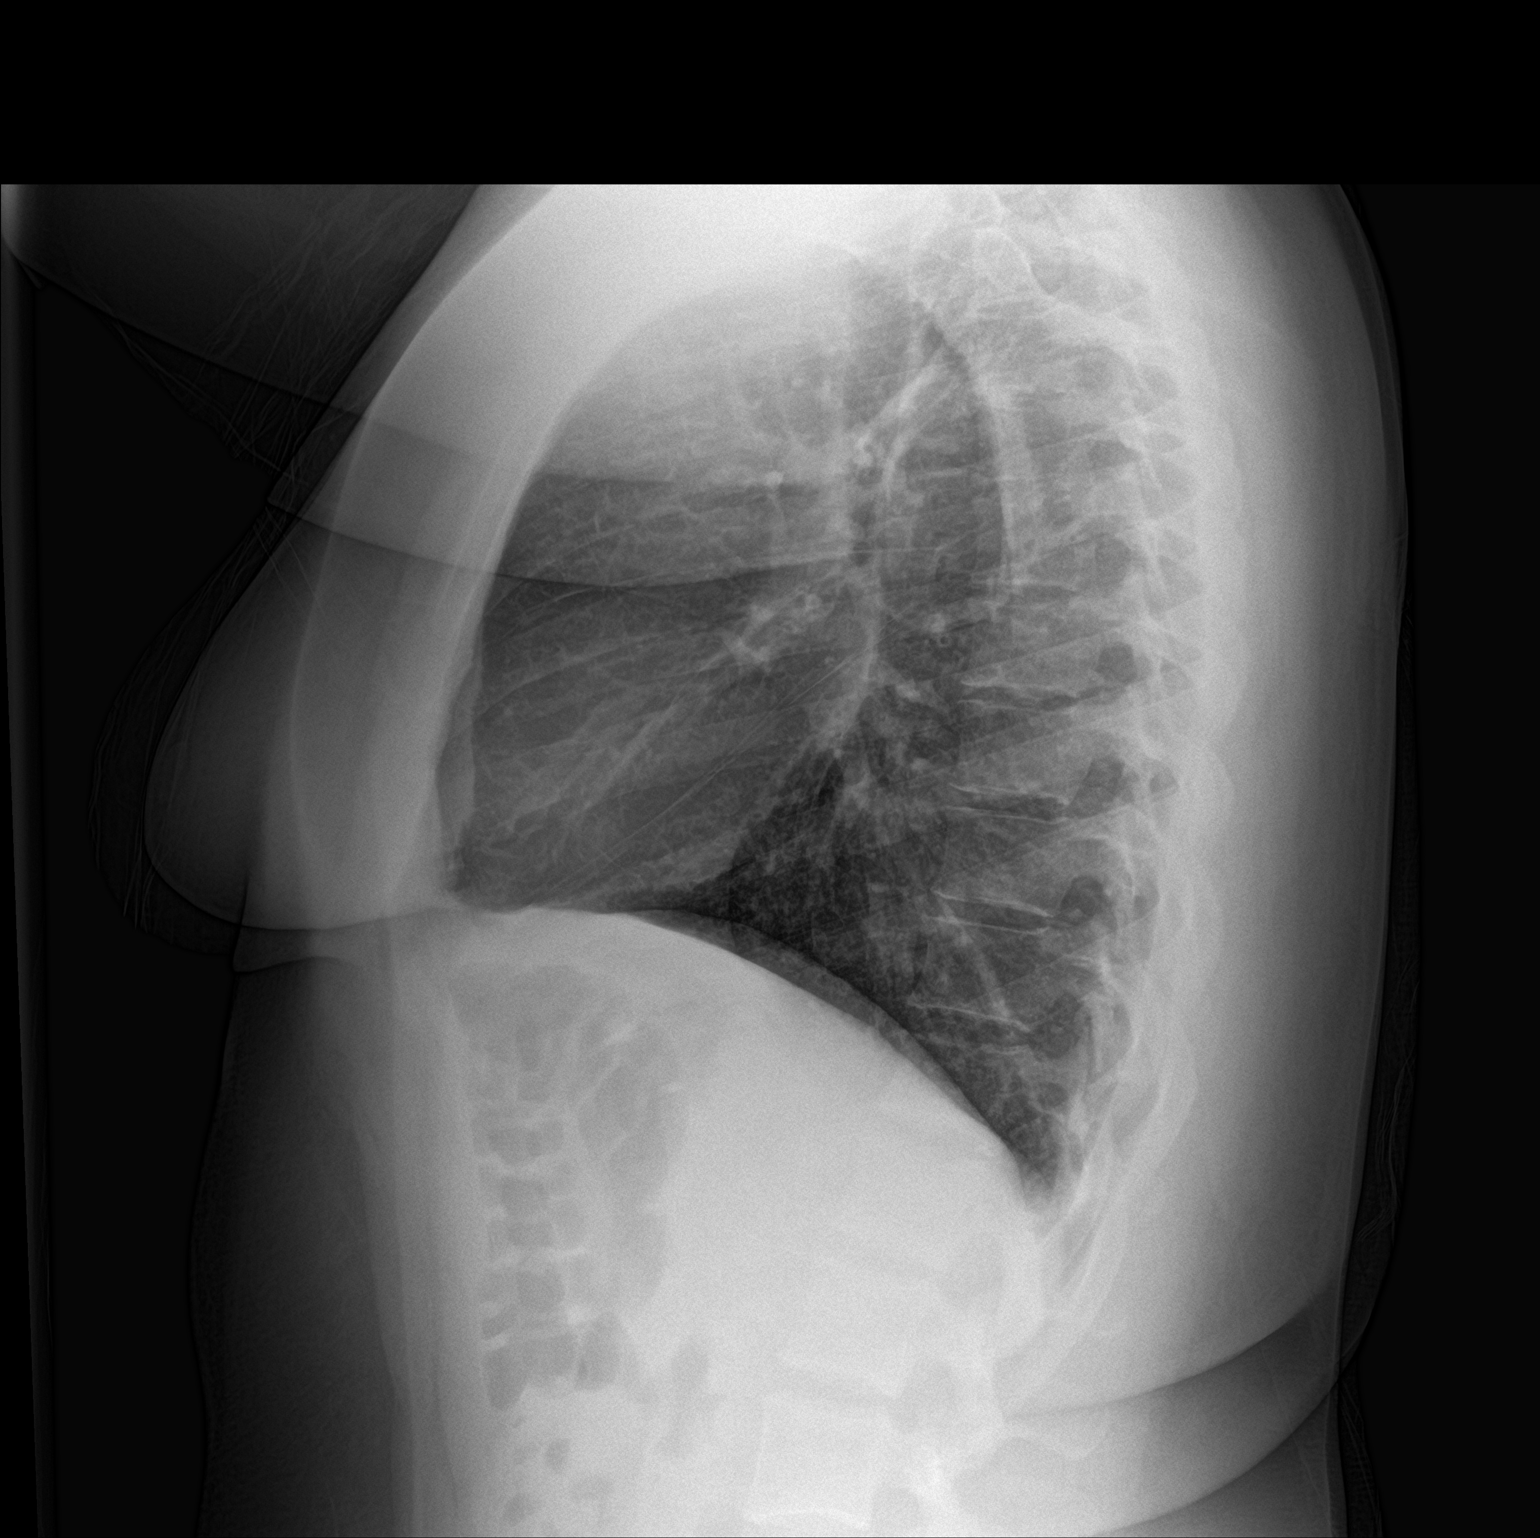

[2 of 2 positions shown; findings below may reference images not displayed]

FINDINGS: The cardiomediastinal contours are normal. There is bronchial
thickening most severe in the lower lobes. Pulmonary vasculature is
normal. No consolidation, pleural effusion, or pneumothorax. No
acute osseous abnormalities are seen.
IMPRESSION: Lower lobe bronchial thickening, acute bronchitis versus asthma.

## 2015-12-01 MED ORDER — ONDANSETRON 4 MG PO TBDP: 4.0000 mg | ORAL_TABLET | Freq: Three times a day (TID) | ORAL | Status: DC | PRN

## 2015-12-01 MED ORDER — PREDNISONE 20 MG PO TABS: 60.0000 mg | ORAL_TABLET | Freq: Every day | ORAL | Status: DC

## 2015-12-01 MED ORDER — ALBUTEROL SULFATE HFA 108 (90 BASE) MCG/ACT IN AERS
4.0000 | INHALATION_SPRAY | Freq: Once | RESPIRATORY_TRACT | Status: AC
  Administered 2015-12-01: 4 via RESPIRATORY_TRACT
  Filled 2015-12-01: qty 6.7

## 2015-12-01 MED ORDER — ONDANSETRON 4 MG PO TBDP
4.0000 mg | ORAL_TABLET | Freq: Once | ORAL | Status: AC
  Administered 2015-12-01: 4 mg via ORAL
  Filled 2015-12-01: qty 1

## 2015-12-01 MED ORDER — BENZONATATE 100 MG PO CAPS: 100.0000 mg | ORAL_CAPSULE | Freq: Three times a day (TID) | ORAL | Status: DC | PRN

## 2015-12-01 MED ORDER — PREDNISONE 10 MG PO TABS
60.0000 mg | ORAL_TABLET | Freq: Once | ORAL | Status: AC
  Administered 2015-12-01: 60 mg via ORAL
  Filled 2015-12-01: qty 1

## 2015-12-01 NOTE — ED Provider Notes (Signed)
I saw and evaluated the patient, reviewed the resident's note and I agree with the findings and plan.   EKG Interpretation   Date/Time:  Friday November 30 2015 20:31:49 EST Ventricular Rate:  120 PR Interval:  140 QRS Duration: 76 QT Interval:  310 QTC Calculation: 438 R Axis:   88 Text Interpretation:  Sinus tachycardia Right atrial enlargement  Borderline ECG No old tracing to compare Confirmed by Joelee Snoke,  DO, Suraiya Dickerson  ([id number]) on 11/30/2015 11:58:08 PM     Pt is a 25 y.o. female with history of asthma and tobacco use who presents to the emergency department with over one week of hot flashes, productive cough, nasal congestion and several days of nausea, vomiting or diarrhea. Last episode of vomiting was over 12 hours ago. She denies any headache, neck pain, chest pain, abdominal pain. No shortness of breath. Has not had an influenza vaccination. No sick contacts or recent travel. On examination, patient is afebrile. Initially was tachycardic but this has resolved without intervention. She has mild expiratory wheezing but no increased work of breathing, speaking. It is, no hypoxia or respiratory distress. Abdomen soft and nontender. Labs show mild leukocytosis with left shift. Otherwise labs unremarkable. Urine shows no ketones and she does not appear dehydrated. She reports she is able to drink water.  Chest x-ray shows bronchitic changes but no infiltrate. I feel she is safe to be discharged. Provider with albuterol inhaler in the emergency department. We'll discharge with prescription for Zofran, prednisone. Discussed return precautions. She verbalizes understanding and is comfortable with this plan.  Carol MawKristen N Marquin Patino, DO 12/01/15 [phone]

## 2015-12-01 NOTE — Discharge Instructions (Signed)
Asthma, Acute Bronchospasm °Acute bronchospasm caused by asthma is also referred to as an asthma attack. Bronchospasm means your air passages become narrowed. The narrowing is caused by inflammation and tightening of the muscles in the air tubes (bronchi) in your lungs. This can make it hard to breathe or cause you to wheeze and cough. °CAUSES °Possible triggers are: °· Animal dander from the skin, hair, or feathers of animals. °· Dust mites contained in house dust. °· Cockroaches. °· Pollen from trees or grass. °· Mold. °· Cigarette or tobacco smoke. °· Air pollutants such as dust, household cleaners, hair sprays, aerosol sprays, paint fumes, strong chemicals, or strong odors. °· Cold air or weather changes. Cold air may trigger inflammation. Winds increase molds and pollens in the air. °· Strong emotions such as crying or laughing hard. °· Stress. °· Certain medicines such as aspirin or beta-blockers. °· Sulfites in foods and drinks, such as dried fruits and wine. °· Infections or inflammatory conditions, such as a flu, cold, or inflammation of the nasal membranes (rhinitis). °· Gastroesophageal reflux disease (GERD). GERD is a condition where stomach acid backs up into your esophagus. °· Exercise or strenuous activity. °SIGNS AND SYMPTOMS  °· Wheezing. °· Excessive coughing, particularly at night. °· Chest tightness. °· Shortness of breath. °DIAGNOSIS  °Your health care provider will ask you about your medical history and perform a physical exam. A chest X-ray or blood testing may be performed to look for other causes of your symptoms or other conditions that may have triggered your asthma attack.  °TREATMENT  °Treatment is aimed at reducing inflammation and opening up the airways in your lungs.  Most asthma attacks are treated with inhaled medicines. These include quick relief or rescue medicines (such as bronchodilators) and controller medicines (such as inhaled corticosteroids). These medicines are sometimes  given through an inhaler or a nebulizer. Systemic steroid medicine taken by mouth or given through an IV tube also can be used to reduce the inflammation when an attack is moderate or severe. Antibiotic medicines are only used if a bacterial infection is present.  °HOME CARE INSTRUCTIONS  °· Rest. °· Drink plenty of liquids. This helps the mucus to remain thin and be easily coughed up. Only use caffeine in moderation and do not use alcohol until you have recovered from your illness. °· Do not smoke. Avoid being exposed to secondhand smoke. °· You play a critical role in keeping yourself in good health. Avoid exposure to things that cause you to wheeze or to have breathing problems. °· Keep your medicines up-to-date and available. Carefully follow your health care provider's treatment plan. °· Take your medicine exactly as prescribed. °· When pollen or pollution is bad, keep windows closed and use an air conditioner or go to places with air conditioning. °· Asthma requires careful medical care. See your health care provider for a follow-up as advised. If you are more than [redacted] weeks pregnant and you were prescribed any new medicines, let your obstetrician know about the visit and how you are doing. Follow up with your health care provider as directed. °· After you have recovered from your asthma attack, make an appointment with your outpatient doctor to talk about ways to reduce the likelihood of future attacks. If you do not have a doctor who manages your asthma, make an appointment with a primary care doctor to discuss your asthma. °SEEK IMMEDIATE MEDICAL CARE IF:  °· You are getting worse. °· You have trouble breathing. If severe, call your local   emergency services (911 in the [place]  You develop chest pain or discomfort.  You are vomiting.  You are not able to keep fluids down.  You are coughing up yellow, green, brown, or bloody sputum.  You have a fever and your symptoms suddenly get worse.  You have  trouble swallowing. MAKE SURE YOU:   Understand these instructions.  Will watch your condition.  Will get help right away if you are not doing well or get worse.   This information is not intended to replace advice given to you by your health care provider. Make sure you discuss any questions you have with your health care provider.   Document Released: 12/24/2006 Document Revised: 09/13/2013 Document Reviewed: 03/16/2013 Elsevier Interactive Patient Education 2016 [employer]  Diarrhea Diarrhea is frequent loose and watery bowel movements. It can cause you to feel weak and dehydrated. Dehydration can cause you to become tired and thirsty, have a dry mouth, and have decreased urination that often is dark yellow. Diarrhea is a sign of another problem, most often an infection that will not last long. In most cases, diarrhea typically lasts 2-3 days. However, it can last longer if it is a sign of something more serious. It is important to treat your diarrhea as directed by your caregiver to lessen or prevent future episodes of diarrhea. CAUSES  Some common causes include:  Gastrointestinal infections caused by viruses, bacteria, or parasites.  Food poisoning or food allergies.  Certain medicines, such as antibiotics, chemotherapy, and laxatives.  Artificial sweeteners and fructose.  Digestive disorders. HOME CARE INSTRUCTIONS  Ensure adequate fluid intake (hydration): Have 1 cup (8 oz) of fluid for each diarrhea episode. Avoid fluids that contain simple sugars or sports drinks, fruit juices, whole milk products, and sodas. Your urine should be clear or pale yellow if you are drinking enough fluids. Hydrate with an oral rehydration solution that you can purchase at pharmacies, retail stores, and online. You can prepare an oral rehydration solution at home by mixing the following ingredients together:   - tsp table salt.   tsp baking soda.   tsp salt substitute containing potassium  chloride.  1  tablespoons sugar.  1 L (34 oz) of water.  Certain foods and beverages may increase the speed at which food moves through the gastrointestinal (GI) tract. These foods and beverages should be avoided and include:  Caffeinated and alcoholic beverages.  High-fiber foods, such as raw fruits and vegetables, nuts, seeds, and whole grain breads and cereals.  Foods and beverages sweetened with sugar alcohols, such as xylitol, sorbitol, and mannitol.  Some foods may be well tolerated and may help thicken stool including:  Starchy foods, such as rice, toast, pasta, low-sugar cereal, oatmeal, grits, baked potatoes, crackers, and bagels.  Bananas.  Applesauce.  Add probiotic-rich foods to help increase healthy bacteria in the GI tract, such as yogurt and fermented milk products.  Wash your hands well after each diarrhea episode.  Only take over-the-counter or prescription medicines as directed by your caregiver.  Take a warm bath to relieve any burning or pain from frequent diarrhea episodes. SEEK IMMEDIATE MEDICAL CARE IF:   You are unable to keep fluids down.  You have persistent vomiting.  You have blood in your stool, or your stools are black and tarry.  You do not urinate in 6-8 hours, or there is only a small amount of very dark urine.  You have abdominal pain that increases or localizes.  You have weakness, dizziness,  confusion, or light-headedness.  You have a severe headache.  Your diarrhea gets worse or does not get better.  You have a fever or persistent symptoms for more than 2-3 days.  You have a fever and your symptoms suddenly get worse. MAKE SURE YOU:   Understand these instructions.  Will watch your condition.  Will get help right away if you are not doing well or get worse.   This information is not intended to replace advice given to you by your health care provider. Make sure you discuss any questions you have with your health care  provider.   Document Released: 08/29/2002 Document Revised: 09/29/2014 Document Reviewed: 05/16/2012 Elsevier Interactive Patient Education 2016 [employer]  Nausea and Vomiting Nausea is a sick feeling that often comes before throwing up (vomiting). Vomiting is a reflex where stomach contents come out of your mouth. Vomiting can cause severe loss of body fluids (dehydration). Children and elderly adults can become dehydrated quickly, especially if they also have diarrhea. Nausea and vomiting are symptoms of a condition or disease. It is important to find the cause of your symptoms. CAUSES   Direct irritation of the stomach lining. This irritation can result from increased acid production (gastroesophageal reflux disease), infection, food poisoning, taking certain medicines (such as nonsteroidal anti-inflammatory drugs), alcohol use, or tobacco use.  Signals from the brain.These signals could be caused by a headache, heat exposure, an inner ear disturbance, increased pressure in the brain from injury, infection, a tumor, or a concussion, pain, emotional stimulus, or metabolic problems.  An obstruction in the gastrointestinal tract (bowel obstruction).  Illnesses such as diabetes, hepatitis, gallbladder problems, appendicitis, kidney problems, cancer, sepsis, atypical symptoms of a heart attack, or eating disorders.  Medical treatments such as chemotherapy and radiation.  Receiving medicine that makes you sleep (general anesthetic) during surgery. DIAGNOSIS Your caregiver may ask for tests to be done if the problems do not improve after a few days. Tests may also be done if symptoms are severe or if the reason for the nausea and vomiting is not clear. Tests may include:  Urine tests.  Blood tests.  Stool tests.  Cultures (to look for evidence of infection).  X-rays or other imaging studies. Test results can help your caregiver make decisions about treatment or the need for  additional tests. TREATMENT You need to stay well hydrated. Drink frequently but in small amounts.You may wish to drink water, sports drinks, clear broth, or eat frozen ice pops or gelatin dessert to help stay hydrated.When you eat, eating slowly may help prevent nausea.There are also some antinausea medicines that may help prevent nausea. HOME CARE INSTRUCTIONS   Take all medicine as directed by your caregiver.  If you do not have an appetite, do not force yourself to eat. However, you must continue to drink fluids.  If you have an appetite, eat a normal diet unless your caregiver tells you differently.  Eat a variety of complex carbohydrates (rice, wheat, potatoes, bread), lean meats, yogurt, fruits, and vegetables.  Avoid high-fat foods because they are more difficult to digest.  Drink enough water and fluids to keep your urine clear or pale yellow.  If you are dehydrated, ask your caregiver for specific rehydration instructions. Signs of dehydration may include:  Severe thirst.  Dry lips and mouth.  Dizziness.  Dark urine.  Decreasing urine frequency and amount.  Confusion.  Rapid breathing or pulse. SEEK IMMEDIATE MEDICAL CARE IF:   You have blood or brown flecks (like  coffee grounds) in your vomit.  You have black or bloody stools.  You have a severe headache or stiff neck.  You are confused.  You have severe abdominal pain.  You have chest pain or trouble breathing.  You do not urinate at least once every 8 hours.  You develop cold or clammy skin.  You continue to vomit for longer than 24 to 48 hours.  You have a fever. MAKE SURE YOU:   Understand these instructions.  Will watch your condition.  Will get help right away if you are not doing well or get worse.   This information is not intended to replace advice given to you by your health care provider. Make sure you discuss any questions you have with your health care provider.   Document  Released: 09/08/2005 Document Revised: 12/01/2011 Document Reviewed: 02/05/2011 Elsevier Interactive Patient Education 2016 [employer]  Upper Respiratory Infection, Adult Most upper respiratory infections (URIs) are a viral infection of the air passages leading to the lungs. A URI affects the nose, throat, and upper air passages. The most common type of URI is nasopharyngitis and is typically referred to as "the common cold." URIs run their course and usually go away on their own. Most of the time, a URI does not require medical attention, but sometimes a bacterial infection in the upper airways can follow a viral infection. This is called a secondary infection. Sinus and middle ear infections are common types of secondary upper respiratory infections. Bacterial pneumonia can also complicate a URI. A URI can worsen asthma and chronic obstructive pulmonary disease (COPD). Sometimes, these complications can require emergency medical care and may be life threatening.  CAUSES Almost all URIs are caused by viruses. A virus is a type of germ and can spread from one person to another.  RISKS FACTORS You may be at risk for a URI if:   You smoke.   You have chronic heart or lung disease.  You have a weakened defense (immune) system.   You are very young or very old.   You have nasal allergies or asthma.  You work in crowded or poorly ventilated areas.  You work in health care facilities or schools. SIGNS AND SYMPTOMS  Symptoms typically develop 2-3 days after you come in contact with a cold virus. Most viral URIs last 7-10 days. However, viral URIs from the influenza virus (flu virus) can last 14-18 days and are typically more severe. Symptoms may include:   Runny or stuffy (congested) nose.   Sneezing.   Cough.   Sore throat.   Headache.   Fatigue.   Fever.   Loss of appetite.   Pain in your forehead, behind your eyes, and over your cheekbones (sinus  pain).  Muscle aches.  DIAGNOSIS  Your health care provider may diagnose a URI by:  Physical exam.  Tests to check that your symptoms are not due to another condition such as:  Strep throat.  Sinusitis.  Pneumonia.  Asthma. TREATMENT  A URI goes away on its own with time. It cannot be cured with medicines, but medicines may be prescribed or recommended to relieve symptoms. Medicines may help:  Reduce your fever.  Reduce your cough.  Relieve nasal congestion. HOME CARE INSTRUCTIONS   Take medicines only as directed by your health care provider.   Gargle warm saltwater or take cough drops to comfort your throat as directed by your health care provider.  Use a warm mist humidifier or inhale steam from  a shower to increase air moisture. This may make it easier to breathe. °· Drink enough fluid to keep your urine clear or pale yellow.   °· Eat soups and other clear broths and maintain good nutrition.   °· Rest as needed.   °· Return to work when your temperature has returned to normal or as your health care provider advises. You may need to stay home longer to avoid infecting others. You can also use a face mask and careful hand washing to prevent spread of the virus. °· Increase the usage of your inhaler if you have asthma.   °· Do not use any tobacco products, including cigarettes, chewing tobacco, or electronic cigarettes. If you need help quitting, ask your health care provider. °PREVENTION  °The best way to protect yourself from getting a cold is to practice good hygiene.  °· Avoid oral or hand contact with people with cold symptoms.   °· Wash your hands often if contact occurs.   °There is no clear evidence that vitamin C, vitamin E, echinacea, or exercise reduces the chance of developing a cold. However, it is always recommended to get plenty of rest, exercise, and practice good nutrition.  °SEEK MEDICAL CARE IF:  °· You are getting worse rather than better.   °· Your symptoms are  not controlled by medicine.   °· You have chills. °· You have worsening shortness of breath. °· You have brown or red mucus. °· You have yellow or brown nasal discharge. °· You have pain in your face, especially when you bend forward. °· You have a fever. °· You have swollen neck glands. °· You have pain while swallowing. °· You have white areas in the back of your throat. °SEEK IMMEDIATE MEDICAL CARE IF:  °· You have severe or persistent: °¨ Headache. °¨ Ear pain. °¨ Sinus pain. °¨ Chest pain. °· You have chronic lung disease and any of the following: °¨ Wheezing. °¨ Prolonged cough. °¨ Coughing up blood. °¨ A change in your usual mucus. °· You have a stiff neck. °· You have changes in your: °¨ Vision. °¨ Hearing. °¨ Thinking. °¨ Mood. °MAKE SURE YOU:  °· Understand these instructions. °· Will watch your condition. °· Will get help right away if you are not doing well or get worse. °  °This information is not intended to replace advice given to you by your health care provider. Make sure you discuss any questions you have with your health care provider. °  °Document Released: 03/04/2001 Document Revised: 01/23/2015 Document Reviewed: 12/14/2013 °Elsevier Interactive Patient Education ©2016 [employer] ° °

## 2020-10-23 ENCOUNTER — Telehealth: Payer: Self-pay

## 2020-10-23 NOTE — Telephone Encounter (Signed)
Pt went to the eye doctor yesterday. Pt was given a referral and was told to call one of the doctors on the referral but she dosn't know which one to call.

## 2022-02-06 ENCOUNTER — Emergency Department (HOSPITAL_COMMUNITY): Payer: Medicaid Other

## 2022-02-06 ENCOUNTER — Inpatient Hospital Stay (HOSPITAL_COMMUNITY)
Admission: EM | Admit: 2022-02-06 | Discharge: 2022-02-26 | DRG: 853 | Discharge status: Against Medical Advice | Payer: Medicaid Other | Attending: Internal Medicine | Admitting: Internal Medicine

## 2022-02-06 ENCOUNTER — Encounter (HOSPITAL_COMMUNITY): Payer: Self-pay

## 2022-02-06 ENCOUNTER — Other Ambulatory Visit: Payer: Self-pay

## 2022-02-06 DIAGNOSIS — T8241XA Breakdown (mechanical) of vascular dialysis catheter, initial encounter: Secondary | ICD-10-CM | POA: Diagnosis not present

## 2022-02-06 DIAGNOSIS — K7200 Acute and subacute hepatic failure without coma: Secondary | ICD-10-CM | POA: Diagnosis present

## 2022-02-06 DIAGNOSIS — M4624 Osteomyelitis of vertebra, thoracic region: Secondary | ICD-10-CM | POA: Diagnosis present

## 2022-02-06 DIAGNOSIS — R4182 Altered mental status, unspecified: Secondary | ICD-10-CM | POA: Diagnosis not present

## 2022-02-06 DIAGNOSIS — E8770 Fluid overload, unspecified: Secondary | ICD-10-CM | POA: Diagnosis not present

## 2022-02-06 DIAGNOSIS — R7989 Other specified abnormal findings of blood chemistry: Secondary | ICD-10-CM | POA: Diagnosis not present

## 2022-02-06 DIAGNOSIS — R748 Abnormal levels of other serum enzymes: Secondary | ICD-10-CM | POA: Diagnosis present

## 2022-02-06 DIAGNOSIS — R0781 Pleurodynia: Secondary | ICD-10-CM | POA: Diagnosis present

## 2022-02-06 DIAGNOSIS — E872 Acidosis, unspecified: Secondary | ICD-10-CM | POA: Diagnosis present

## 2022-02-06 DIAGNOSIS — B182 Chronic viral hepatitis C: Secondary | ICD-10-CM | POA: Diagnosis present

## 2022-02-06 DIAGNOSIS — R5381 Other malaise: Secondary | ICD-10-CM | POA: Diagnosis present

## 2022-02-06 DIAGNOSIS — D696 Thrombocytopenia, unspecified: Secondary | ICD-10-CM | POA: Diagnosis present

## 2022-02-06 DIAGNOSIS — A4902 Methicillin resistant Staphylococcus aureus infection, unspecified site: Secondary | ICD-10-CM | POA: Diagnosis not present

## 2022-02-06 DIAGNOSIS — D6489 Other specified anemias: Secondary | ICD-10-CM | POA: Diagnosis present

## 2022-02-06 DIAGNOSIS — J45909 Unspecified asthma, uncomplicated: Secondary | ICD-10-CM | POA: Diagnosis present

## 2022-02-06 DIAGNOSIS — I071 Rheumatic tricuspid insufficiency: Secondary | ICD-10-CM | POA: Diagnosis present

## 2022-02-06 DIAGNOSIS — F1990 Other psychoactive substance use, unspecified, uncomplicated: Secondary | ICD-10-CM | POA: Diagnosis present

## 2022-02-06 DIAGNOSIS — E162 Hypoglycemia, unspecified: Secondary | ICD-10-CM | POA: Diagnosis present

## 2022-02-06 DIAGNOSIS — R6521 Severe sepsis with septic shock: Secondary | ICD-10-CM | POA: Diagnosis present

## 2022-02-06 DIAGNOSIS — A4102 Sepsis due to Methicillin resistant Staphylococcus aureus: Secondary | ICD-10-CM | POA: Diagnosis present

## 2022-02-06 DIAGNOSIS — F419 Anxiety disorder, unspecified: Secondary | ICD-10-CM | POA: Diagnosis present

## 2022-02-06 DIAGNOSIS — Z87898 Personal history of other specified conditions: Secondary | ICD-10-CM

## 2022-02-06 DIAGNOSIS — N179 Acute kidney failure, unspecified: Secondary | ICD-10-CM | POA: Diagnosis not present

## 2022-02-06 DIAGNOSIS — R768 Other specified abnormal immunological findings in serum: Secondary | ICD-10-CM | POA: Diagnosis not present

## 2022-02-06 DIAGNOSIS — D649 Anemia, unspecified: Secondary | ICD-10-CM | POA: Diagnosis not present

## 2022-02-06 DIAGNOSIS — F17210 Nicotine dependence, cigarettes, uncomplicated: Secondary | ICD-10-CM | POA: Diagnosis present

## 2022-02-06 DIAGNOSIS — R7881 Bacteremia: Secondary | ICD-10-CM

## 2022-02-06 DIAGNOSIS — I330 Acute and subacute infective endocarditis: Secondary | ICD-10-CM | POA: Diagnosis present

## 2022-02-06 DIAGNOSIS — R52 Pain, unspecified: Secondary | ICD-10-CM | POA: Diagnosis not present

## 2022-02-06 DIAGNOSIS — R042 Hemoptysis: Secondary | ICD-10-CM | POA: Diagnosis present

## 2022-02-06 DIAGNOSIS — Z20822 Contact with and (suspected) exposure to covid-19: Secondary | ICD-10-CM | POA: Diagnosis present

## 2022-02-06 DIAGNOSIS — J189 Pneumonia, unspecified organism: Secondary | ICD-10-CM | POA: Diagnosis present

## 2022-02-06 DIAGNOSIS — Z5329 Procedure and treatment not carried out because of patient's decision for other reasons: Secondary | ICD-10-CM | POA: Diagnosis present

## 2022-02-06 DIAGNOSIS — E8809 Other disorders of plasma-protein metabolism, not elsewhere classified: Secondary | ICD-10-CM | POA: Diagnosis not present

## 2022-02-06 DIAGNOSIS — G9341 Metabolic encephalopathy: Secondary | ICD-10-CM | POA: Diagnosis present

## 2022-02-06 DIAGNOSIS — M869 Osteomyelitis, unspecified: Secondary | ICD-10-CM | POA: Diagnosis not present

## 2022-02-06 DIAGNOSIS — D509 Iron deficiency anemia, unspecified: Secondary | ICD-10-CM | POA: Diagnosis present

## 2022-02-06 DIAGNOSIS — M549 Dorsalgia, unspecified: Secondary | ICD-10-CM | POA: Diagnosis not present

## 2022-02-06 DIAGNOSIS — N059 Unspecified nephritic syndrome with unspecified morphologic changes: Secondary | ICD-10-CM | POA: Diagnosis not present

## 2022-02-06 DIAGNOSIS — F1120 Opioid dependence, uncomplicated: Secondary | ICD-10-CM | POA: Diagnosis present

## 2022-02-06 DIAGNOSIS — Z881 Allergy status to other antibiotic agents status: Secondary | ICD-10-CM

## 2022-02-06 DIAGNOSIS — M00012 Staphylococcal arthritis, left shoulder: Secondary | ICD-10-CM | POA: Diagnosis present

## 2022-02-06 DIAGNOSIS — I079 Rheumatic tricuspid valve disease, unspecified: Secondary | ICD-10-CM | POA: Diagnosis not present

## 2022-02-06 DIAGNOSIS — Z7952 Long term (current) use of systemic steroids: Secondary | ICD-10-CM

## 2022-02-06 DIAGNOSIS — E871 Hypo-osmolality and hyponatremia: Secondary | ICD-10-CM | POA: Diagnosis present

## 2022-02-06 DIAGNOSIS — Z6833 Body mass index (BMI) 33.0-33.9, adult: Secondary | ICD-10-CM

## 2022-02-06 DIAGNOSIS — A419 Sepsis, unspecified organism: Secondary | ICD-10-CM | POA: Diagnosis present

## 2022-02-06 DIAGNOSIS — Z79899 Other long term (current) drug therapy: Secondary | ICD-10-CM

## 2022-02-06 DIAGNOSIS — E875 Hyperkalemia: Secondary | ICD-10-CM | POA: Diagnosis not present

## 2022-02-06 DIAGNOSIS — E876 Hypokalemia: Secondary | ICD-10-CM | POA: Diagnosis present

## 2022-02-06 DIAGNOSIS — E669 Obesity, unspecified: Secondary | ICD-10-CM | POA: Diagnosis present

## 2022-02-06 DIAGNOSIS — M25512 Pain in left shoulder: Secondary | ICD-10-CM | POA: Diagnosis present

## 2022-02-06 DIAGNOSIS — I2690 Septic pulmonary embolism without acute cor pulmonale: Secondary | ICD-10-CM | POA: Diagnosis present

## 2022-02-06 DIAGNOSIS — Y838 Other surgical procedures as the cause of abnormal reaction of the patient, or of later complication, without mention of misadventure at the time of the procedure: Secondary | ICD-10-CM | POA: Diagnosis not present

## 2022-02-06 DIAGNOSIS — M009 Pyogenic arthritis, unspecified: Secondary | ICD-10-CM | POA: Diagnosis not present

## 2022-02-06 DIAGNOSIS — I38 Endocarditis, valve unspecified: Secondary | ICD-10-CM | POA: Diagnosis not present

## 2022-02-06 DIAGNOSIS — I76 Septic arterial embolism: Secondary | ICD-10-CM | POA: Diagnosis not present

## 2022-02-06 DIAGNOSIS — I361 Nonrheumatic tricuspid (valve) insufficiency: Secondary | ICD-10-CM | POA: Diagnosis not present

## 2022-02-06 DIAGNOSIS — B9562 Methicillin resistant Staphylococcus aureus infection as the cause of diseases classified elsewhere: Secondary | ICD-10-CM | POA: Diagnosis not present

## 2022-02-06 DIAGNOSIS — E861 Hypovolemia: Secondary | ICD-10-CM | POA: Diagnosis present

## 2022-02-06 HISTORY — DX: Other psychoactive substance abuse, uncomplicated: F19.10

## 2022-02-06 HISTORY — DX: Sepsis, unspecified organism: A41.9

## 2022-02-06 LAB — COMPREHENSIVE METABOLIC PANEL
ALT: 454 U/L — ABNORMAL HIGH (ref 0–44)
AST: 257 U/L — ABNORMAL HIGH (ref 15–41)
Albumin: 3 g/dL — ABNORMAL LOW (ref 3.5–5.0)
Alkaline Phosphatase: 163 U/L — ABNORMAL HIGH (ref 38–126)
Anion gap: 12 (ref 5–15)
BUN: 12 mg/dL (ref 6–20)
CO2: 24 mmol/L (ref 22–32)
Calcium: 8.3 mg/dL — ABNORMAL LOW (ref 8.9–10.3)
Chloride: 92 mmol/L — ABNORMAL LOW (ref 98–111)
Creatinine, Ser: 0.55 mg/dL (ref 0.44–1.00)
GFR, Estimated: >60 mL/min (ref >60)
Glucose, Bld: 118 mg/dL — ABNORMAL HIGH (ref 70–99)
Potassium: 4.8 mmol/L (ref 3.5–5.1)
Sodium: 128 mmol/L — ABNORMAL LOW (ref 135–145)
Total Bilirubin: 6.9 mg/dL — ABNORMAL HIGH (ref 0.3–1.2)
Total Protein: 7.8 g/dL (ref 6.5–8.1)

## 2022-02-06 LAB — CBC WITH DIFFERENTIAL/PLATELET
Abs Immature Granulocytes: 0.17 10*3/uL — ABNORMAL HIGH (ref 0.00–0.07)
Basophils Absolute: 0.1 10*3/uL (ref 0.0–0.1)
Basophils Relative: 1 %
Eosinophils Absolute: 0 10*3/uL (ref 0.0–0.5)
Eosinophils Relative: 0 %
HCT: 37 % (ref 36.0–46.0)
Hemoglobin: 12.2 g/dL (ref 12.0–15.0)
Immature Granulocytes: 1 %
Lymphocytes Relative: 7 %
Lymphs Abs: 1.2 10*3/uL (ref 0.7–4.0)
MCH: 25.1 pg — ABNORMAL LOW (ref 26.0–34.0)
MCHC: 33 g/dL (ref 30.0–36.0)
MCV: 76.1 fL — ABNORMAL LOW (ref 80.0–100.0)
Monocytes Absolute: 1.5 10*3/uL — ABNORMAL HIGH (ref 0.1–1.0)
Monocytes Relative: 8 %
Neutro Abs: 14.9 10*3/uL — ABNORMAL HIGH (ref 1.7–7.7)
Neutrophils Relative %: 83 %
Platelets: 187 10*3/uL (ref 150–400)
RBC: 4.86 MIL/uL (ref 3.87–5.11)
RDW: 22.1 % — ABNORMAL HIGH (ref 11.5–15.5)
WBC: 17.9 10*3/uL — ABNORMAL HIGH (ref 4.0–10.5)
nRBC: 0 % (ref 0.0–0.2)

## 2022-02-06 LAB — PROTIME-INR
INR: 1.3 — ABNORMAL HIGH (ref 0.8–1.2)
Prothrombin Time: 16.4 seconds — ABNORMAL HIGH (ref 11.4–15.2)

## 2022-02-06 LAB — I-STAT BETA HCG BLOOD, ED (MC, WL, AP ONLY): I-stat hCG, quantitative: <5 m[IU]/mL (ref <5)

## 2022-02-06 LAB — RESP PANEL BY RT-PCR (FLU A&B, COVID) ARPGX2
Influenza A by PCR: NEGATIVE
Influenza B by PCR: NEGATIVE
SARS Coronavirus 2 by RT PCR: NEGATIVE

## 2022-02-06 LAB — APTT: aPTT: 28 seconds (ref 24–36)

## 2022-02-06 LAB — LACTIC ACID, PLASMA: Lactic Acid, Venous: 4 mmol/L (ref 0.5–1.9)

## 2022-02-06 IMAGING — DX DG CHEST 1V PORT
1 series · 1 of 1 positions shown · non-contrast
Comparison: Chest x-ray [DATE].

CLINICAL DATA: Questionable sepsis.

EXAM:
PORTABLE CHEST 1 VIEW

[chest ap]
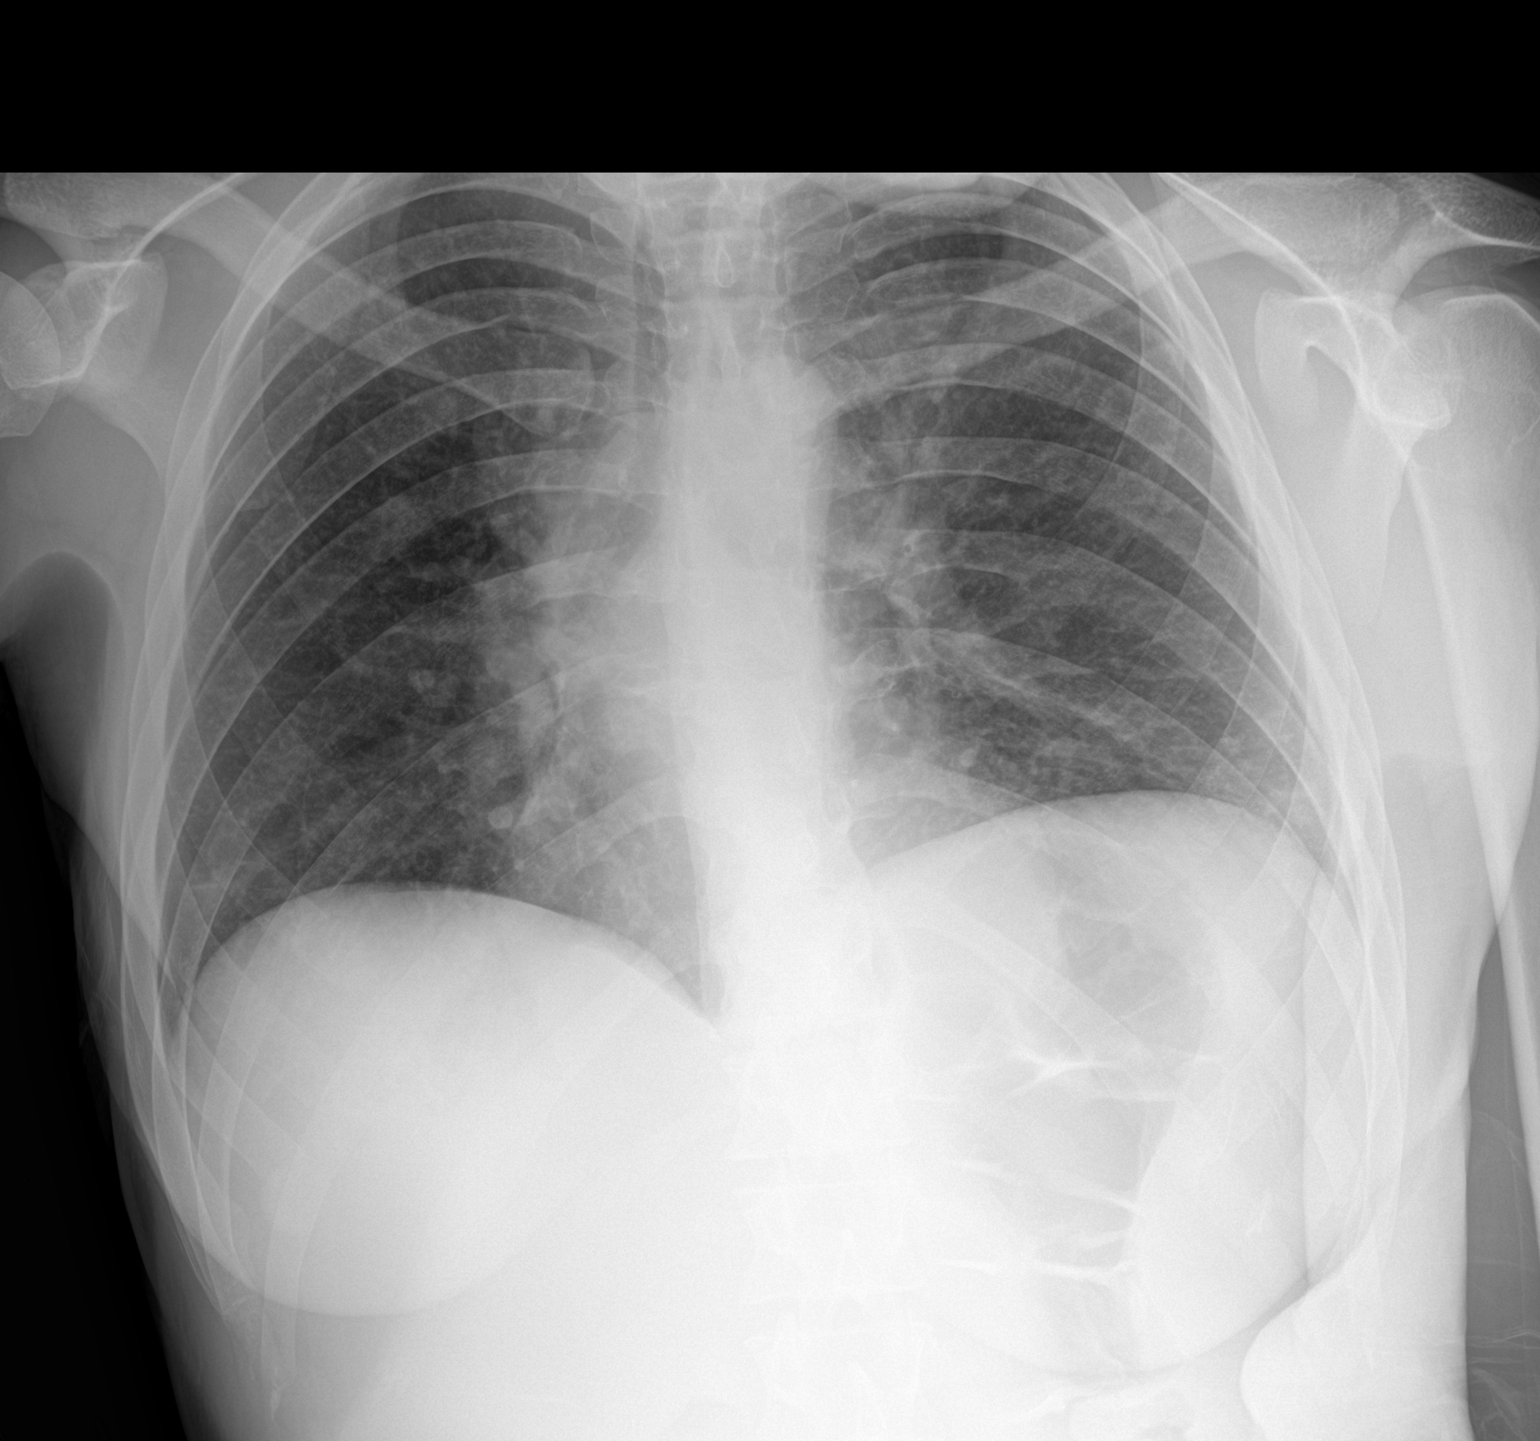

[1 of 1 positions shown; findings below may reference images not displayed]

FINDINGS: The heart size and mediastinal contours are within normal limits.
Both lungs are clear. The visualized skeletal structures are
unremarkable.
IMPRESSION: No active disease.

## 2022-02-06 IMAGING — DX DG SHOULDER 1V*L*
2 series · 2 of 2 positions shown · non-contrast
Comparison: None Available.

CLINICAL DATA: Pain, fever, heroin abuse

EXAM:
LEFT SHOULDER

[shoulder ap (1 of 2)]
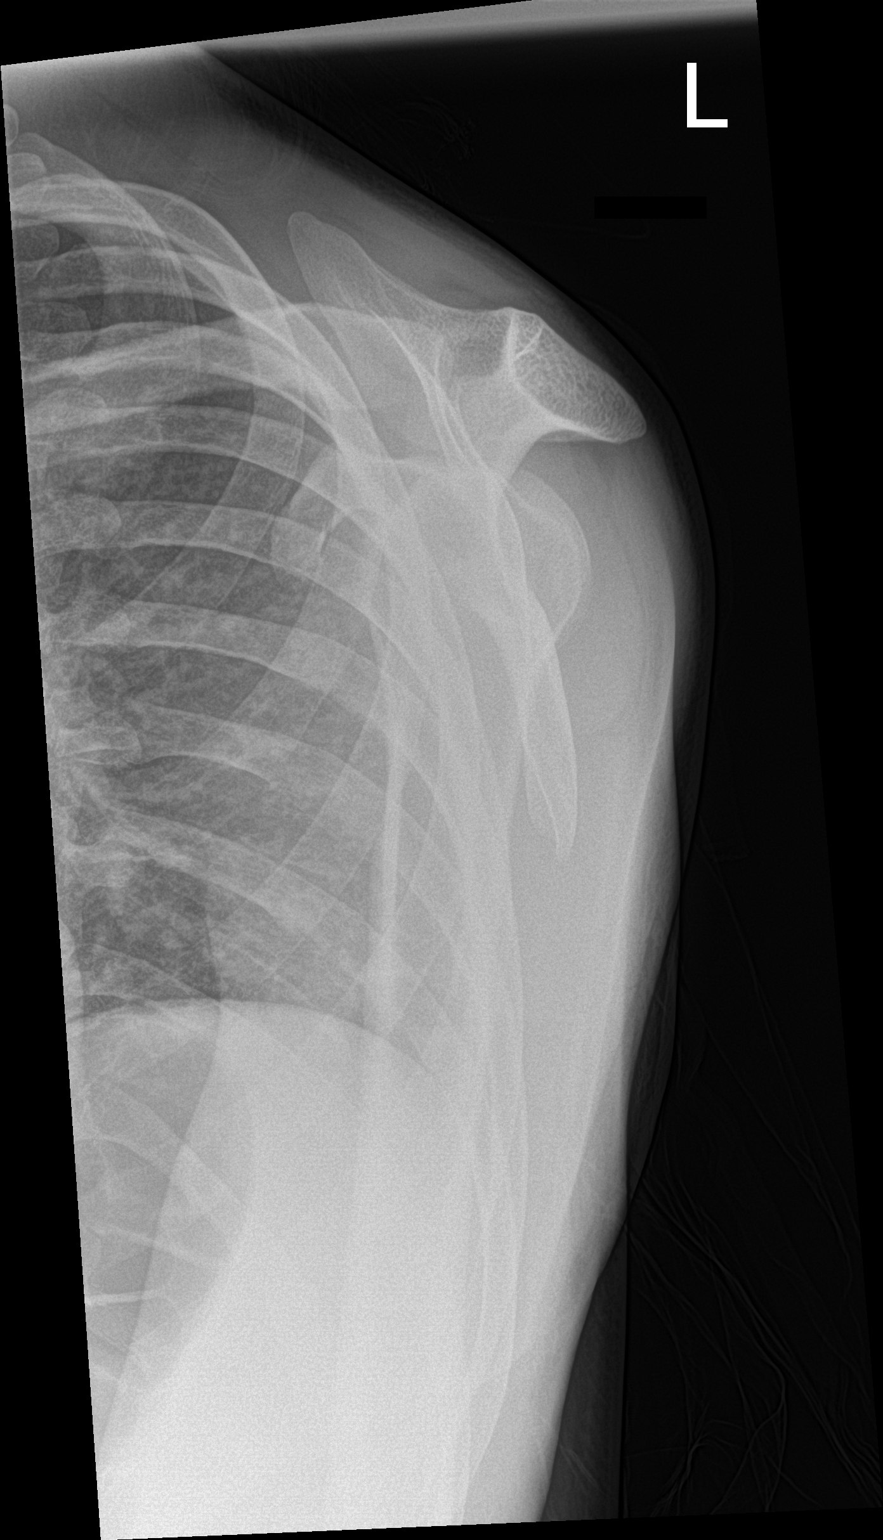

[shoulder ap (2 of 2)]
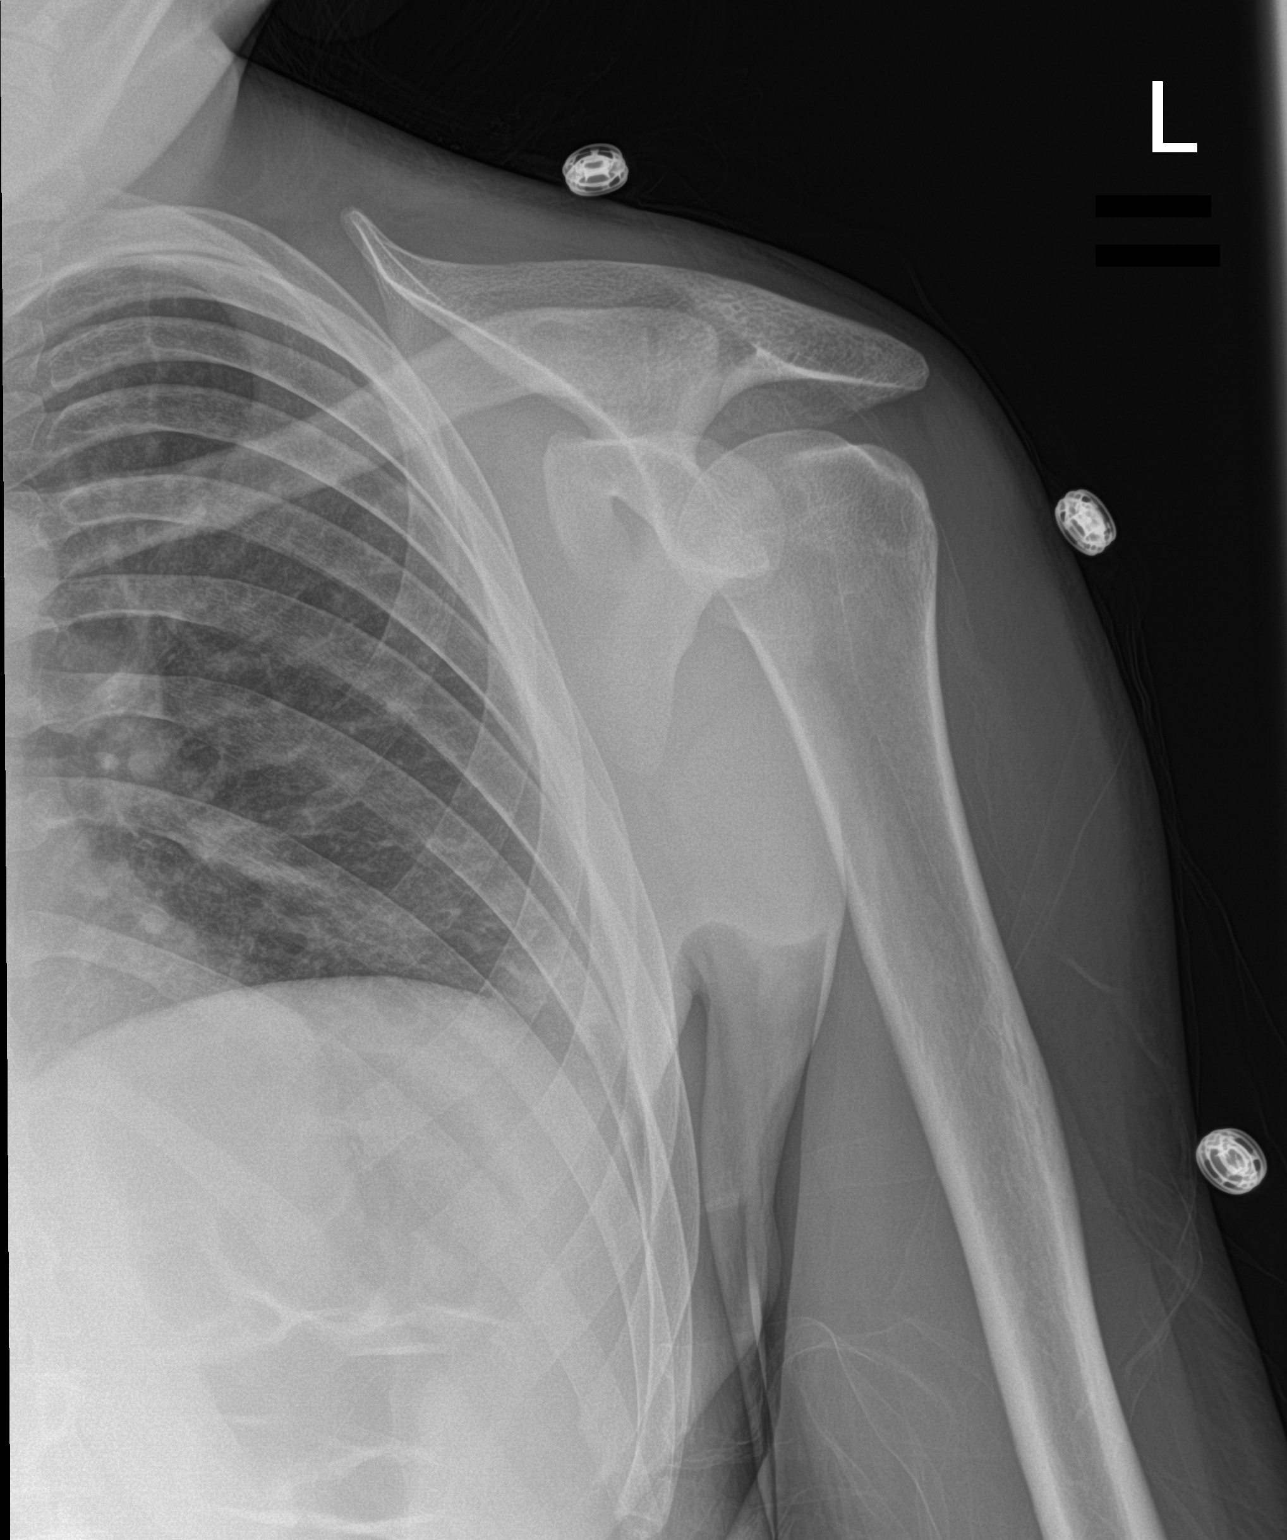

[2 of 2 positions shown; findings below may reference images not displayed]

FINDINGS: Frontal and lateral views of the left shoulder are obtained. There
are no acute or destructive bony lesions. Joint spaces are well
preserved. Soft tissues are unremarkable without subcutaneous gas or
radiopaque foreign body. Visualized portions of the left chest are
clear.
IMPRESSION: 1. Unremarkable left shoulder.

## 2022-02-06 MED ORDER — VANCOMYCIN HCL IN DEXTROSE 1-5 GM/200ML-% IV SOLN
1000.0000 mg | Freq: Once | INTRAVENOUS | Status: AC
  Administered 2022-02-06: 1000 mg via INTRAVENOUS
  Filled 2022-02-06: qty 200

## 2022-02-06 MED ORDER — LACTATED RINGERS IV BOLUS (SEPSIS)
1000.0000 mL | Freq: Once | INTRAVENOUS | Status: AC
  Administered 2022-02-06: 1000 mL via INTRAVENOUS

## 2022-02-06 MED ORDER — SODIUM CHLORIDE 0.9 % IV SOLN
2.0000 g | Freq: Once | INTRAVENOUS | Status: AC
  Administered 2022-02-06: 2 g via INTRAVENOUS
  Filled 2022-02-06: qty 12.5

## 2022-02-06 MED ORDER — LACTATED RINGERS IV BOLUS (SEPSIS)
250.0000 mL | Freq: Once | INTRAVENOUS | Status: AC
  Administered 2022-02-06: 250 mL via INTRAVENOUS

## 2022-02-06 MED ORDER — FENTANYL CITRATE PF 50 MCG/ML IJ SOSY
100.0000 ug | PREFILLED_SYRINGE | Freq: Once | INTRAMUSCULAR | Status: AC
  Administered 2022-02-06: 100 ug via INTRAVENOUS
  Filled 2022-02-06: qty 2

## 2022-02-06 MED ORDER — METRONIDAZOLE 500 MG/100ML IV SOLN
500.0000 mg | Freq: Once | INTRAVENOUS | Status: AC
  Administered 2022-02-06: 500 mg via INTRAVENOUS
  Filled 2022-02-06: qty 100

## 2022-02-06 MED ORDER — LACTATED RINGERS IV BOLUS (SEPSIS)
500.0000 mL | Freq: Once | INTRAVENOUS | Status: AC
  Administered 2022-02-06: 500 mL via INTRAVENOUS

## 2022-02-06 MED ORDER — ACETAMINOPHEN 325 MG PO TABS
650.0000 mg | ORAL_TABLET | Freq: Once | ORAL | Status: AC
  Administered 2022-02-06: 650 mg via ORAL
  Filled 2022-02-06: qty 2

## 2022-02-06 MED ORDER — LACTATED RINGERS IV SOLN: INTRAVENOUS | Status: AC

## 2022-02-06 MED ORDER — LACTATED RINGERS IV BOLUS: 1000.0000 mL | Freq: Once | INTRAVENOUS | Status: DC

## 2022-02-06 NOTE — Progress Notes (Signed)
Sepsis tracking by [employer] 

## 2022-02-06 NOTE — ED Notes (Signed)
Pt walked to restroom. This nurse placed hat in the toilet and pt peed in commode still. Will attempt for another specimen at a later time.

## 2022-02-06 NOTE — Progress Notes (Signed)
A consult was received from an ED physician for cefepime and vancomycin per pharmacy dosing.  The patient's profile has been reviewed for ht/wt/allergies/indication/available labs.   A one time order has been placed for cefepime 2g and vancomycin 1g.  Further antibiotics/pharmacy consults should be ordered by admitting physician if indicated.                       Thank you, Loralee Pacas, PharmD, BCPS 02/06/2022  9:26 PM

## 2022-02-06 NOTE — ED Triage Notes (Signed)
Pt reports with fever and generalized body aches x 2 days. 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 Boyfriend states that he thinks she is septic because this has happened before. Last heroin use today.

## 2022-02-06 NOTE — ED Provider Notes (Signed)
[facility] DEPT Provider Note   CSN: [id number] Arrival date & time: 02/06/22  1850     History  Chief Complaint  Patient presents with   Fever   Generalized Body Aches    EDIT Carol Rivera is a 31 y.o. female.  HPI Patient presenting for evaluation of fever, cough with green sputum, and left shoulder pain.  Onset of symptoms several days ago.  Exposed to a friend with a similar illness.  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  No known trauma.    Home Medications Prior to Admission medications   Medication Sig Start Date End Date Taking? Authorizing Provider  benzonatate (TESSALON) 100 MG capsule Take 1 capsule (100 mg total) by mouth 3 (three) times daily as needed for cough. 12/01/15   Ward, Delice Bison, DO  DM-Phenylephrine-Acetaminophen (ROBITUSSIN COLD+FLU DAYTIME) 10-5-325 MG CAPS Take 2 capsules by mouth once as needed (for cough and cold symptoms).    [provider]  ondansetron (ZOFRAN ODT) 4 MG disintegrating tablet Take 1 tablet (4 mg total) by mouth every 8 (eight) hours as needed for nausea or vomiting. 12/01/15   Ward, Delice Bison, DO  predniSONE (DELTASONE) 20 MG tablet Take 3 tablets (60 mg total) by mouth daily. 12/01/15   Ward, Delice Bison, DO      Allergies    Doxycycline    Review of Systems   Review of Systems  Physical Exam Updated Vital Signs BP 117/79   Pulse (!) 145   Temp (!) 102.7 F (39.3 C) (Oral)   Resp 18   Ht 5' (1.524 m)   Wt 52.2 kg   SpO2 97%   BMI 22.46 kg/m  Physical Exam Vitals and nursing note reviewed.  Constitutional:      General: She is in acute distress (Crying with pain).     Appearance: She is well-developed. She is not ill-appearing.  HENT:     Head: Normocephalic and atraumatic.     Right Ear: External ear normal.     Left Ear: External ear normal.  Eyes:     Conjunctiva/sclera: Conjunctivae normal.     Pupils: Pupils are equal, round, and reactive to light.  Neck:     Trachea:  Phonation normal.  Cardiovascular:     Rate and Rhythm: Tachycardia present.     Heart sounds: Murmur heard.  Pulmonary:     Effort: Pulmonary effort is normal.  Abdominal:     General: There is no distension.     Palpations: Abdomen is soft.  Musculoskeletal:     Cervical back: Normal range of motion and neck supple.     Comments: Left shoulder is tender to light touch without palpable or visible deformity.  She does not move the left shoulder secondary to pain in it.  Skin:    General: Skin is warm and dry.  Neurological:     Mental Status: She is alert and oriented to person, place, and time.     Cranial Nerves: No cranial nerve deficit.     Sensory: No sensory deficit.     Motor: No abnormal muscle tone.     Coordination: Coordination normal.  Psychiatric:        Mood and Affect: Mood normal.        Behavior: Behavior normal.        Thought Content: Thought content normal.        Judgment: Judgment normal.    ED Results / Procedures / Treatments   Labs (  all labs ordered are listed, but only abnormal results are displayed) Labs Reviewed  CBC WITH DIFFERENTIAL/PLATELET - Abnormal; Notable for the following components:      Result Value   WBC 17.9 (*)    MCV 76.1 (*)    MCH 25.1 (*)    RDW 22.1 (*)    Neutro Abs 14.9 (*)    Monocytes Absolute 1.5 (*)    Abs Immature Granulocytes 0.17 (*)    All other components within normal limits  COMPREHENSIVE METABOLIC PANEL - Abnormal; Notable for the following components:   Sodium 128 (*)    Chloride 92 (*)    Glucose, Bld 118 (*)    Calcium 8.3 (*)    Albumin 3.0 (*)    AST 257 (*)    ALT 454 (*)    Alkaline Phosphatase 163 (*)    Total Bilirubin 6.9 (*)    All other components within normal limits  LACTIC ACID, PLASMA - Abnormal; Notable for the following components:   Lactic Acid, Venous 4.0 (*)    All other components within normal limits  PROTIME-INR - Abnormal; Notable for the following components:   Prothrombin  Time 16.4 (*)    INR 1.3 (*)    All other components within normal limits  RESP PANEL BY RT-PCR (FLU A&B, COVID) ARPGX2  CULTURE, BLOOD (ROUTINE X 2)  CULTURE, BLOOD (ROUTINE X 2)  URINE CULTURE  APTT  LACTIC ACID, PLASMA  URINALYSIS, ROUTINE W REFLEX MICROSCOPIC  I-STAT BETA HCG BLOOD, ED ([facility], [facility], [facility] ONLY)    EKG EKG Interpretation  Date/Time:  Thursday Feb 06 2022 20:40:44 EDT Ventricular Rate:  140 PR Interval:  89 QRS Duration: 80 QT Interval:  275 QTC Calculation: 420 R Axis:   87 Text Interpretation: Sinus tachycardia Consider right atrial enlargement RSR' in V1 or V2, probably normal variant Since last tracing rate faster Otherwise no significant change Confirmed by Daleen Bo [phone]) on 02/06/2022 8:49:33 PM  Radiology DG Chest Port 1 View  Result Date: 02/06/2022 CLINICAL DATA:  Questionable sepsis. EXAM: PORTABLE CHEST 1 VIEW COMPARISON:  Chest x-ray 12/01/2015. FINDINGS: The heart size and mediastinal contours are within normal limits. Both lungs are clear. The visualized skeletal structures are unremarkable. IMPRESSION: No active disease. Electronically Signed   By: Ronney Asters M.D.   On: 02/06/2022 20:47   DG Shoulder Left Portable  Result Date: 02/06/2022 CLINICAL DATA:  Pain, fever, heroin abuse EXAM: LEFT SHOULDER COMPARISON:  None Available. FINDINGS: Frontal and lateral views of the left shoulder are obtained. There are no acute or destructive bony lesions. Joint spaces are well preserved. Soft tissues are unremarkable without subcutaneous gas or radiopaque foreign body. Visualized portions of the left chest are clear. IMPRESSION: 1. Unremarkable left shoulder. Electronically Signed   By: Randa Ngo M.D.   On: 02/06/2022 20:43    Procedures Procedures    Medications Ordered in ED Medications  lactated ringers bolus 1,000 mL (has no administration in time range)  lactated ringers infusion (has no administration in time range)  vancomycin (VANCOCIN)  IVPB 1000 mg/200 mL premix (1,000 mg Intravenous New Bag/Given 02/06/22 2155)  acetaminophen (TYLENOL) tablet 650 mg (650 mg Oral Given 02/06/22 2049)  lactated ringers bolus 1,000 mL (0 mLs Intravenous Stopped 02/06/22 2223)  metroNIDAZOLE (FLAGYL) IVPB 500 mg (500 mg Intravenous New Bag/Given 02/06/22 2145)  lactated ringers bolus 1,000 mL (1,000 mLs Intravenous New Bag/Given 02/06/22 2146)    And  lactated ringers bolus 500 mL (500  mLs Intravenous New Bag/Given 02/06/22 2147)    And  lactated ringers bolus 250 mL (250 mLs Intravenous New Bag/Given 02/06/22 2147)  ceFEPIme (MAXIPIME) 2 g in sodium chloride 0.9 % 100 mL IVPB (0 g Intravenous Stopped 02/06/22 2223)  fentaNYL (SUBLIMAZE) injection 100 mcg (100 mcg Intravenous Given 02/06/22 2234)    ED Course/ Medical Decision Making/ A&P                           Medical Decision Making Patient presenting with febrile illness, uses heroin regularly, and has concern for sepsis on arrival.  Lactate returned positive, and patient had code sepsis.  Empiric antibiotics started to include coverage for endocarditis, pneumonia, urinary tract infection.  Initial chest x-ray was normal.  Doubt pneumonia.  Patient requires high-volume saline bolus and close monitoring with hospitalization.  Problems Addressed: Sepsis with acute organ dysfunction and septic shock, due to unspecified organism, unspecified type [facility]): complicated acute illness or injury with systemic symptoms that poses a threat to life or bodily functions    Details: High risk sepsis and patient who uses heroin regularly.  Tachycardia with murmur, concerning for endocarditis.  Amount and/or Complexity of Data Reviewed Independent Historian:     Details: She is a cogent historian Labs: ordered.    Details: CBC, metabolic panel, blood culture, lactate, urinalysis, pregnancy test-normal except lactate high, white count high, sodium low, chloride low, calcium low, AST high, alk phos stays high, ALT  high. Radiology: ordered and independent interpretation performed.    Details: Chest x-ray-no infiltrate or edema ECG/medicine tests: ordered and independent interpretation performed.    Details: Cardiac monitor-sinus tachycardia Discussion of management or test interpretation with external provider(s): Consult hospitalist arrange for admission for further treatment.  Risk OTC drugs. Prescription drug management. Decision regarding hospitalization. Risk Details: Patient presenting with fever and findings consistent with sepsis.  Started on empiric antibiotics to cover nausea, UTI and endocarditis.  Initial lactate elevated.  Patient had symptomatic improvement after IV fluids, improvement of temperature, and heart rate.  Blood pressure remained reassuring.  Critical Care Total time providing critical care: 45 minutes          Final Clinical Impression(s) / ED Diagnoses Final diagnoses:  Sepsis with acute organ dysfunction and septic shock, due to unspecified organism, unspecified type [facility])    Rx / DC Orders ED Discharge Orders     None         Daleen Bo, MD 02/06/22 2328

## 2022-02-07 ENCOUNTER — Inpatient Hospital Stay (HOSPITAL_COMMUNITY): Payer: Medicaid Other

## 2022-02-07 ENCOUNTER — Encounter (HOSPITAL_COMMUNITY): Payer: Self-pay | Admitting: Family Medicine

## 2022-02-07 DIAGNOSIS — R7881 Bacteremia: Secondary | ICD-10-CM | POA: Diagnosis not present

## 2022-02-07 DIAGNOSIS — I76 Septic arterial embolism: Secondary | ICD-10-CM

## 2022-02-07 DIAGNOSIS — F1990 Other psychoactive substance use, unspecified, uncomplicated: Secondary | ICD-10-CM

## 2022-02-07 DIAGNOSIS — R0781 Pleurodynia: Secondary | ICD-10-CM

## 2022-02-07 DIAGNOSIS — K7200 Acute and subacute hepatic failure without coma: Secondary | ICD-10-CM | POA: Diagnosis not present

## 2022-02-07 DIAGNOSIS — R768 Other specified abnormal immunological findings in serum: Secondary | ICD-10-CM

## 2022-02-07 DIAGNOSIS — M549 Dorsalgia, unspecified: Secondary | ICD-10-CM

## 2022-02-07 DIAGNOSIS — E871 Hypo-osmolality and hyponatremia: Secondary | ICD-10-CM | POA: Diagnosis present

## 2022-02-07 DIAGNOSIS — M25512 Pain in left shoulder: Secondary | ICD-10-CM | POA: Diagnosis present

## 2022-02-07 DIAGNOSIS — A419 Sepsis, unspecified organism: Secondary | ICD-10-CM | POA: Diagnosis not present

## 2022-02-07 DIAGNOSIS — R7989 Other specified abnormal findings of blood chemistry: Secondary | ICD-10-CM

## 2022-02-07 DIAGNOSIS — R6521 Severe sepsis with septic shock: Secondary | ICD-10-CM | POA: Diagnosis not present

## 2022-02-07 HISTORY — DX: Other psychoactive substance use, unspecified, uncomplicated: F19.90

## 2022-02-07 HISTORY — DX: Pleurodynia: R07.81

## 2022-02-07 HISTORY — DX: Hypo-osmolality and hyponatremia: E87.1

## 2022-02-07 HISTORY — DX: Other specified abnormal findings of blood chemistry: R79.89

## 2022-02-07 HISTORY — DX: Pain in left shoulder: M25.512

## 2022-02-07 LAB — LACTIC ACID, PLASMA
Lactic Acid, Venous: 2.8 mmol/L (ref 0.5–1.9)
Lactic Acid, Venous: 2.9 mmol/L (ref 0.5–1.9)
Lactic Acid, Venous: 2.9 mmol/L (ref 0.5–1.9)
Lactic Acid, Venous: 4.4 mmol/L (ref 0.5–1.9)
Lactic Acid, Venous: >9 mmol/L (ref 0.5–1.9)

## 2022-02-07 LAB — BLOOD CULTURE ID PANEL (REFLEXED) - BCID2

## 2022-02-07 LAB — COMPREHENSIVE METABOLIC PANEL
ALT: 293 U/L — ABNORMAL HIGH (ref 0–44)
AST: 144 U/L — ABNORMAL HIGH (ref 15–41)
Albumin: 2.3 g/dL — ABNORMAL LOW (ref 3.5–5.0)
Alkaline Phosphatase: 111 U/L (ref 38–126)
Anion gap: 9 (ref 5–15)
BUN: 12 mg/dL (ref 6–20)
CO2: 23 mmol/L (ref 22–32)
Calcium: 8.2 mg/dL — ABNORMAL LOW (ref 8.9–10.3)
Chloride: 98 mmol/L (ref 98–111)
Creatinine, Ser: 0.63 mg/dL (ref 0.44–1.00)
GFR, Estimated: >60 mL/min (ref >60)
Glucose, Bld: 103 mg/dL — ABNORMAL HIGH (ref 70–99)
Potassium: 3.4 mmol/L — ABNORMAL LOW (ref 3.5–5.1)
Sodium: 130 mmol/L — ABNORMAL LOW (ref 135–145)
Total Bilirubin: 6.7 mg/dL — ABNORMAL HIGH (ref 0.3–1.2)
Total Protein: 6 g/dL — ABNORMAL LOW (ref 6.5–8.1)

## 2022-02-07 LAB — ECHOCARDIOGRAM COMPLETE
Area-P 1/2: 5.46 cm2
Calc EF: 67.9 %
Height: 60 in
MV VTI: 1.37 cm2
S' Lateral: 2.8 cm
Single Plane A2C EF: 67.9 %
Single Plane A4C EF: 64.1 %
Weight: 1834.23 oz

## 2022-02-07 LAB — URINALYSIS, ROUTINE W REFLEX MICROSCOPIC
Bilirubin Urine: NEGATIVE
Glucose, UA: NEGATIVE mg/dL
Ketones, ur: NEGATIVE mg/dL
Leukocytes,Ua: NEGATIVE
Nitrite: NEGATIVE
Protein, ur: NEGATIVE mg/dL
Specific Gravity, Urine: 1.002 — ABNORMAL LOW (ref 1.005–1.030)
pH: 6 (ref 5.0–8.0)

## 2022-02-07 LAB — CBC
HCT: 32.9 % — ABNORMAL LOW (ref 36.0–46.0)
Hemoglobin: 10.7 g/dL — ABNORMAL LOW (ref 12.0–15.0)
MCH: 24.8 pg — ABNORMAL LOW (ref 26.0–34.0)
MCHC: 32.5 g/dL (ref 30.0–36.0)
MCV: 76.3 fL — ABNORMAL LOW (ref 80.0–100.0)
Platelets: 126 10*3/uL — ABNORMAL LOW (ref 150–400)
RBC: 4.31 MIL/uL (ref 3.87–5.11)
RDW: 21.9 % — ABNORMAL HIGH (ref 11.5–15.5)
WBC: 13.1 10*3/uL — ABNORMAL HIGH (ref 4.0–10.5)
nRBC: 0 % (ref 0.0–0.2)

## 2022-02-07 LAB — HEPATITIS PANEL, ACUTE
HCV Ab: REACTIVE — AB
Hep A IgM: NONREACTIVE
Hep B C IgM: NONREACTIVE
Hepatitis B Surface Ag: NONREACTIVE

## 2022-02-07 LAB — MRSA NEXT GEN BY PCR, NASAL: MRSA by PCR Next Gen: DETECTED — AB

## 2022-02-07 LAB — PROCALCITONIN: Procalcitonin: 16.71 ng/mL

## 2022-02-07 LAB — HIV ANTIBODY (ROUTINE TESTING W REFLEX): HIV Screen 4th Generation wRfx: NONREACTIVE

## 2022-02-07 IMAGING — MR MR CERVICAL SPINE W/O CM
5 of 6 series · 33 of 48 positions shown · non-contrast
Comparison: None Available.

CLINICAL DATA: Neck pain with possible infection.  IV drug abuse.

EXAM:
MRI CERVICAL, THORACIC AND LUMBAR SPINE WITHOUT CONTRAST
TECHNIQUE: Multiplanar and multiecho pulse sequences of the cervical spine, to
include the craniocervical junction and cervicothoracic junction,
and thoracic and lumbar spine, were obtained without intravenous
contrast.

[Series 16: T1 · sagittal · 3.0mm · 0.69mm/px · 6 of 15 slices shown (1 of 2)]
[im 1/15]
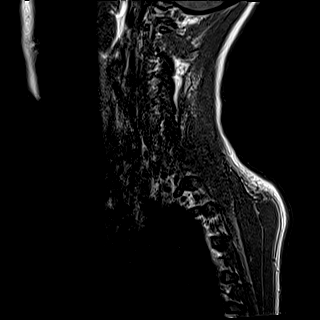
[im 3/15]
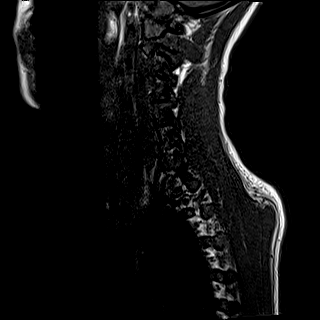
[im 6/15]
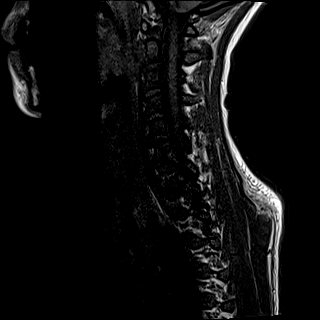
[im 9/15]
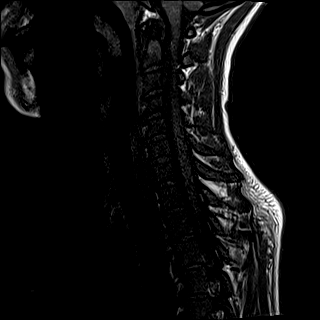
[im 12/15]
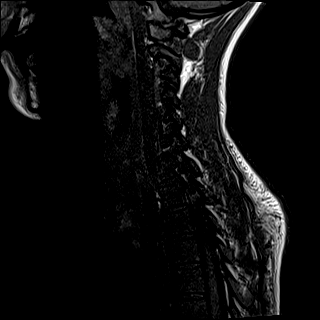
[im 15/15]
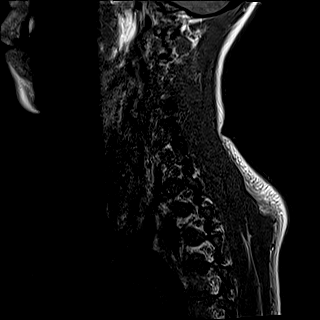

[Series 17: T2 · sagittal · 3.0mm · 0.69mm/px · 5 of 15 slices shown (1 of 2)]
[im 1/15]
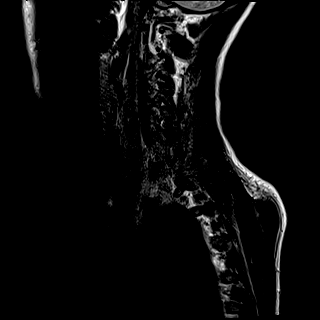
[im 4/15]
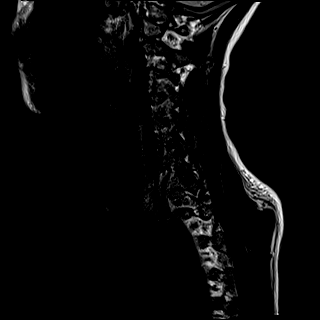
[im 8/15]
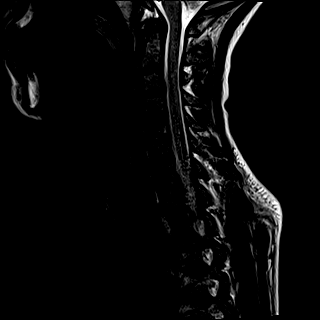
[im 11/15]
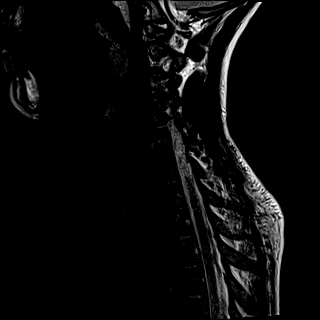
[im 15/15]
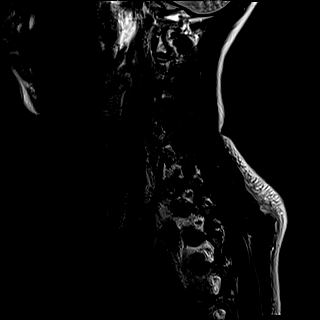

[Series 18: STIR · sagittal · 3.0mm · 0.86mm/px · 3 of 15 slices shown]
[im 1/15]
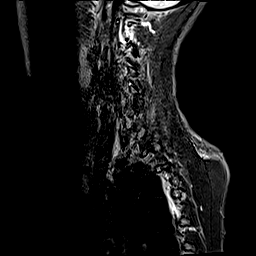
[im 4/15]
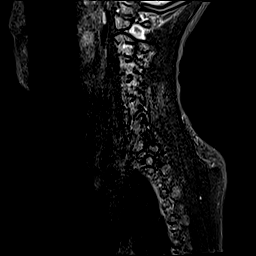
[im 8/15]
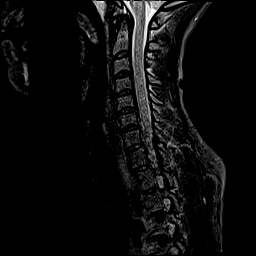

[Series 19: T2 · axial · 3.0mm · 0.70mm/px · z∈[-42,+51]mm · 8 of 28 slices shown (2 of 2)]
[im 1/28]
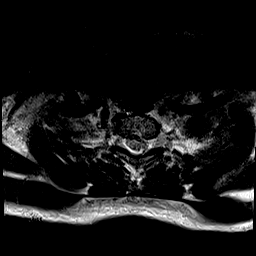
[im 4/28]
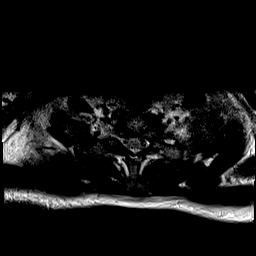
[im 10/28]
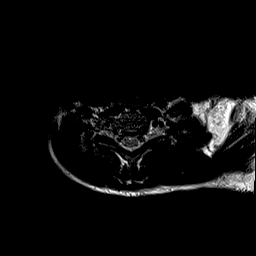
[im 13/28]
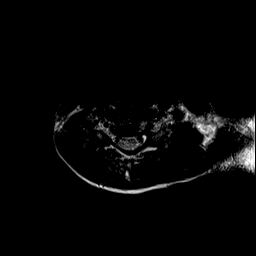
[im 16/28]
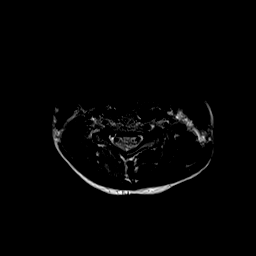
[im 19/28]
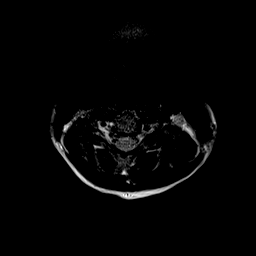
[im 25/28]
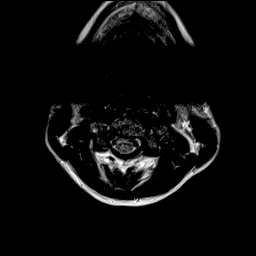
[im 28/28]
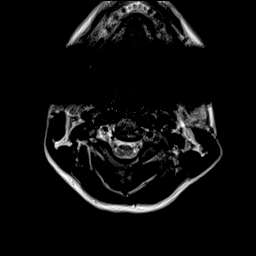

[Series 21: T1 · axial · 3.0mm · 0.35mm/px · z∈[-42,+54]mm · 11 of 29 slices shown (2 of 2)]
[im 1/29]
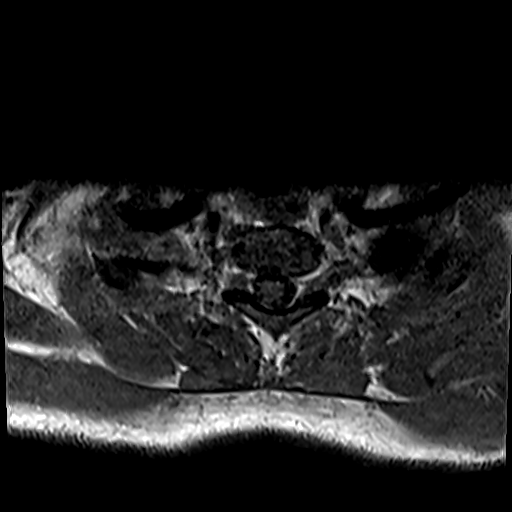
[im 3/29]
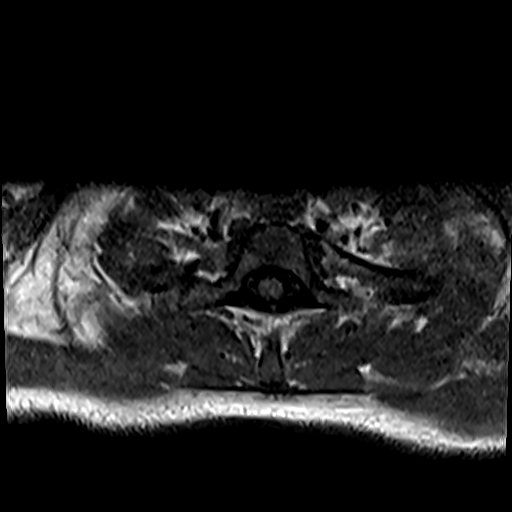
[im 6/29]
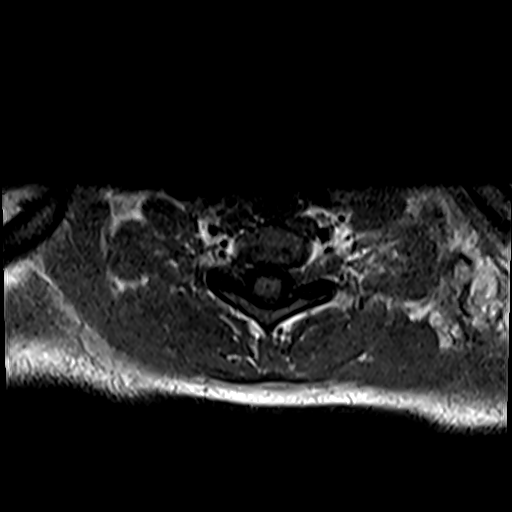
[im 9/29]
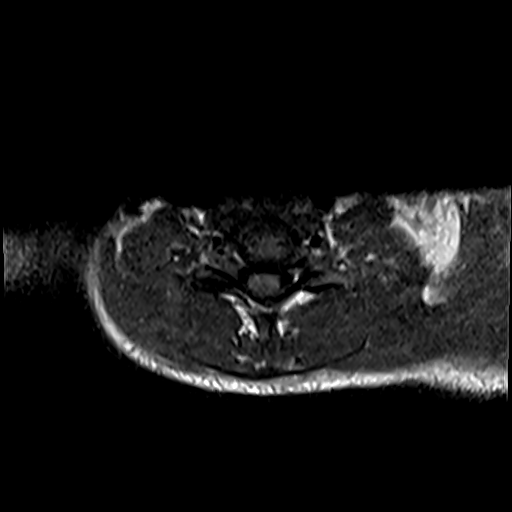
[im 12/29]
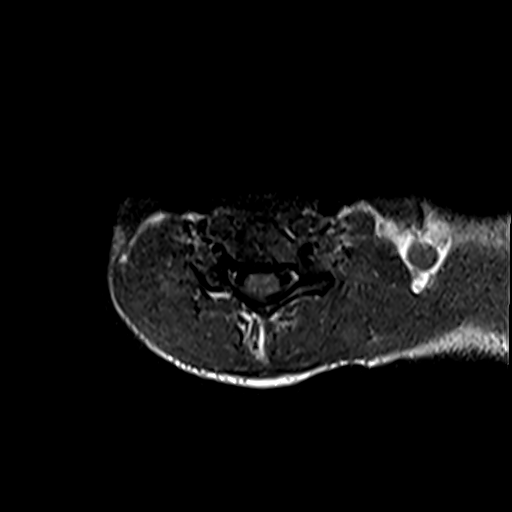
[im 15/29]
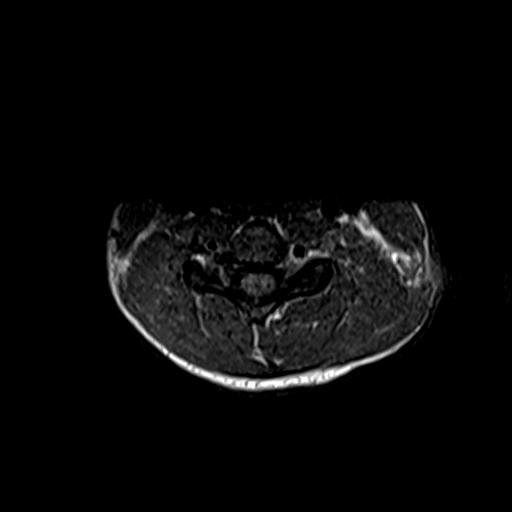
[im 17/29]
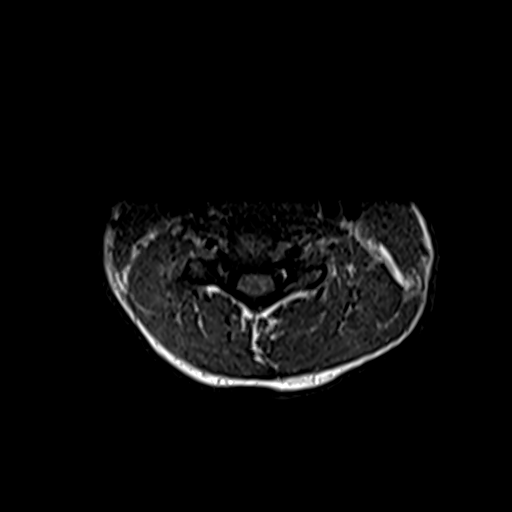
[im 20/29]
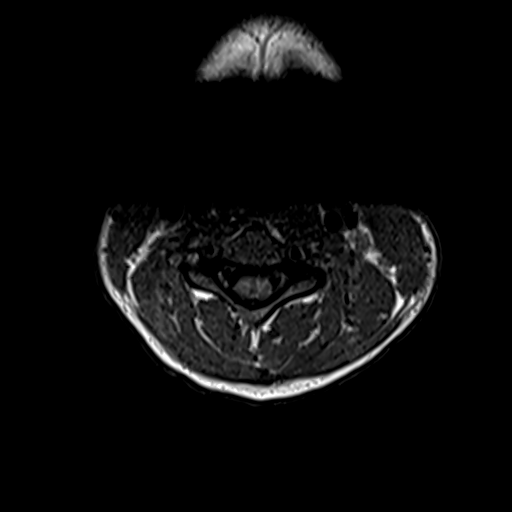
[im 23/29]
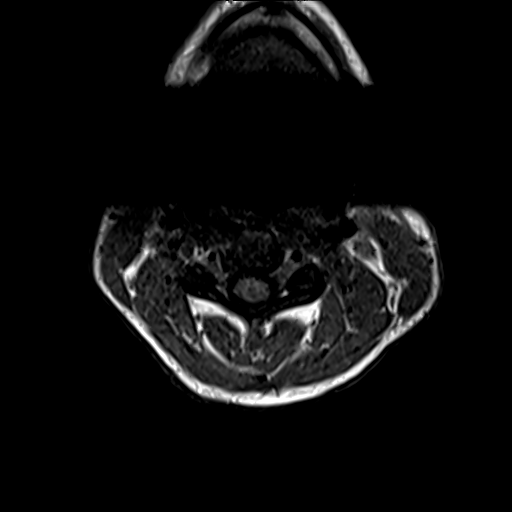
[im 26/29]
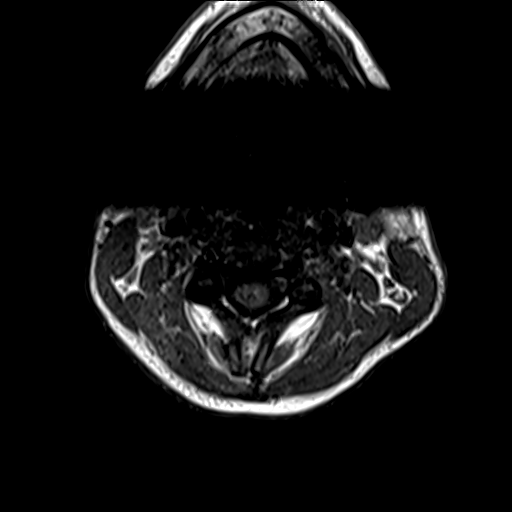
[im 29/29]
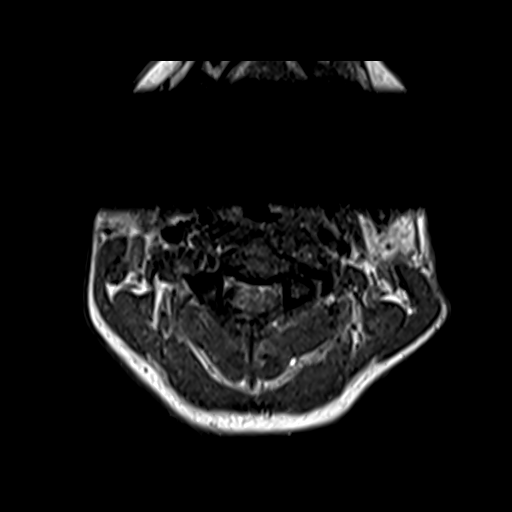

[33 of 48 positions shown; findings below may reference images not displayed]

FINDINGS: MRI CERVICAL SPINE FINDINGS

Alignment: Physiologic.

Vertebrae: No fracture, evidence of discitis, or bone lesion.

Cord: Normal signal and morphology.

Posterior Fossa, vertebral arteries, paraspinal tissues: Negative.

Disc levels:

No spinal canal or neural foraminal stenosis.

MRI THORACIC SPINE FINDINGS

Alignment:  Physiologic.

Vertebrae: No fracture, evidence of discitis, or bone lesion.

Cord:  Normal signal and morphology.

Paraspinal and other soft tissues: There are multiple areas of
consolidation within both lungs concerning for septic emboli.

Disc levels:

There is no spinal canal or neural foraminal stenosis.

MRI LUMBAR SPINE FINDINGS

Segmentation:  Standard

Alignment:  Physiologic.

Vertebrae:  No fracture, evidence of discitis, or bone lesion.

Conus medullaris and cauda equina: Conus extends to the L1 level.
Conus and cauda equina appear normal.

Paraspinal and other soft tissues: Negative

Disc levels:

No spinal canal stenosis
IMPRESSION: 1. Motion degraded examination. Additionally, postcontrast imaging
could not be obtained due to patient inability to tolerate the full
length examination.
2. No discitis-osteomyelitis or epidural abscess.
3. Multiple areas of consolidation within both lungs concerning for
septic emboli.

## 2022-02-07 IMAGING — MR MR LUMBAR SPINE W/O CM
5 series · 31 of 48 positions shown · non-contrast
Comparison: None Available.

CLINICAL DATA: Neck pain with possible infection.  IV drug abuse.

EXAM:
MRI CERVICAL, THORACIC AND LUMBAR SPINE WITHOUT CONTRAST
TECHNIQUE: Multiplanar and multiecho pulse sequences of the cervical spine, to
include the craniocervical junction and cervicothoracic junction,
and thoracic and lumbar spine, were obtained without intravenous
contrast.

[Series 17: T1 · sagittal · 4.0mm · 0.81mm/px · 6 of 17 slices shown (1 of 2)]
[im 1/17]
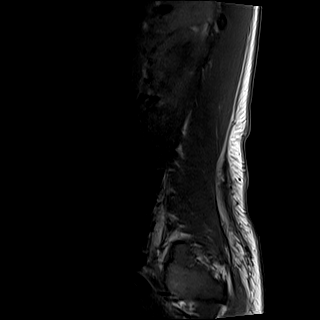
[im 4/17]
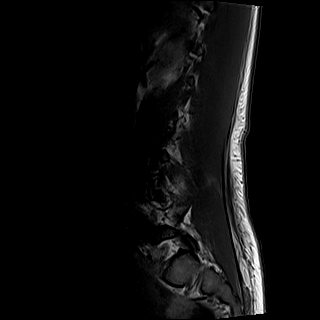
[im 7/17]
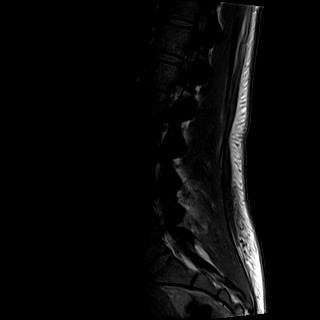
[im 10/17]
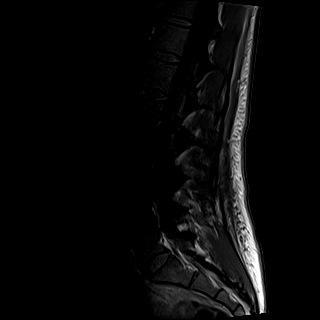
[im 13/17]
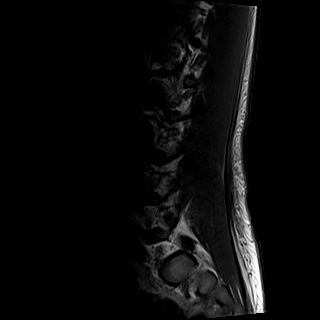
[im 17/17]
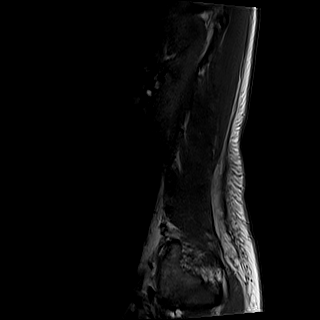

[Series 18: T2 · sagittal · 4.0mm · 0.81mm/px · 6 of 17 slices shown (1 of 2)]
[im 1/17]
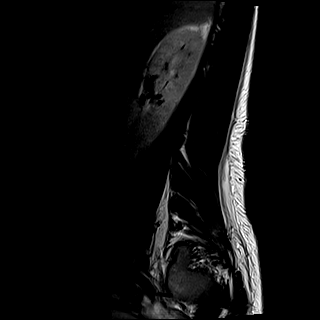
[im 4/17]
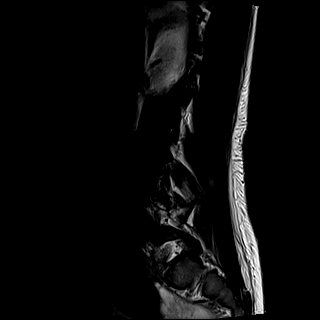
[im 7/17]
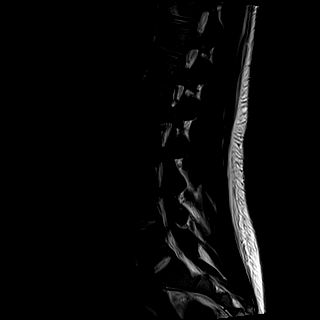
[im 10/17]
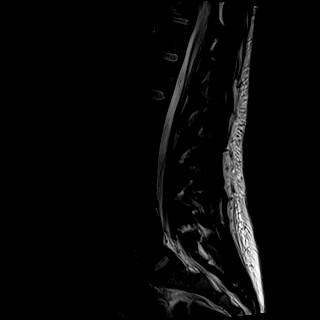
[im 13/17]
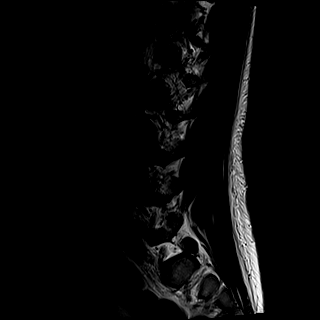
[im 17/17]
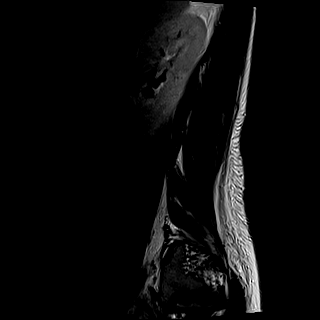

[Series 19: STIR · sagittal · 4.0mm · 0.51mm/px · 1 of 17 slices shown]
[im 1/17]
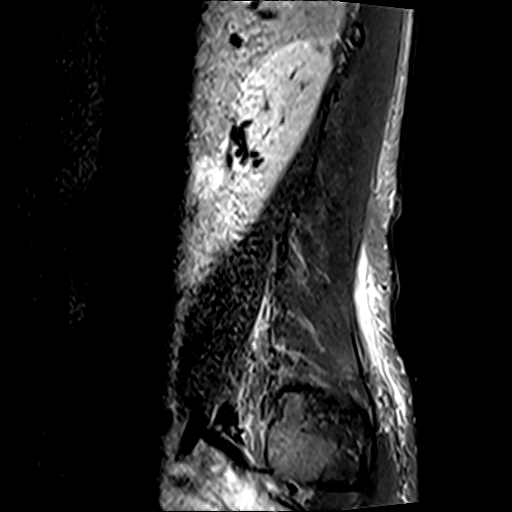

[Series 20: T2 · axial · 4.0mm · 0.62mm/px · z∈[-488,-264]mm · 9 of 39 slices shown (2 of 2)]
[im 1/39]
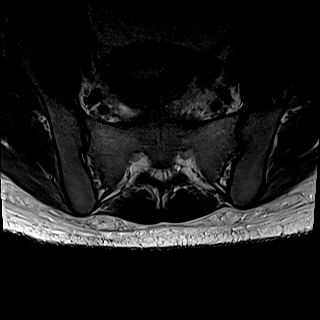
[im 6/39]
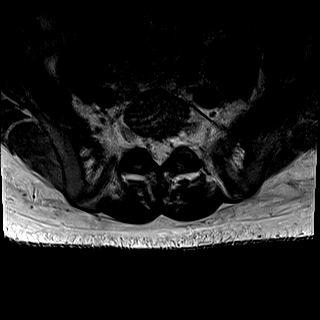
[im 11/39]
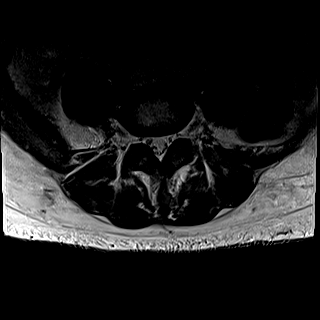
[im 17/39]
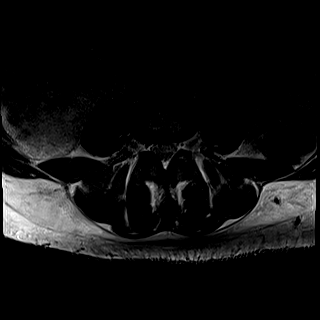
[im 20/39]
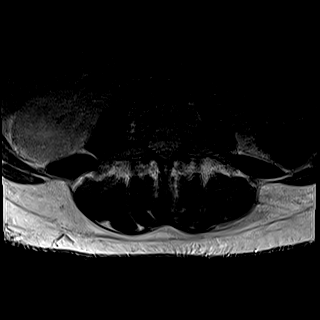
[im 22/39]
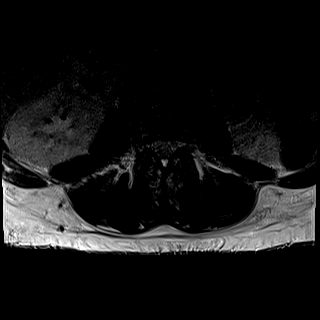
[im 28/39]
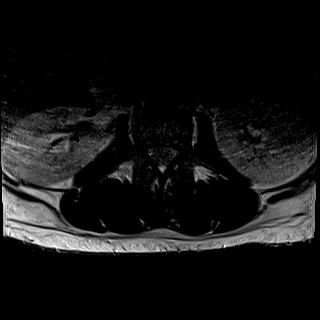
[im 33/39]
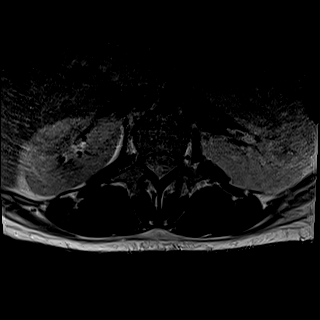
[im 39/39]
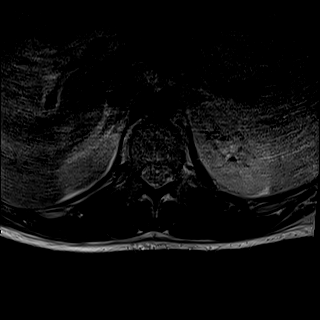

[Series 21: T1 · axial · 4.0mm · 0.39mm/px · z∈[-488,-264]mm · 9 of 39 slices shown (2 of 2)]
[im 1/39]
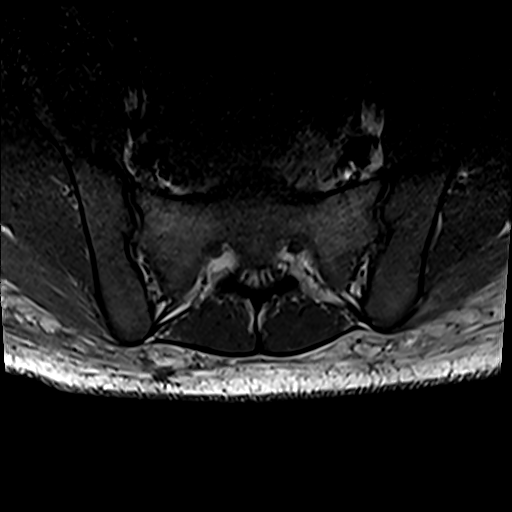
[im 6/39]
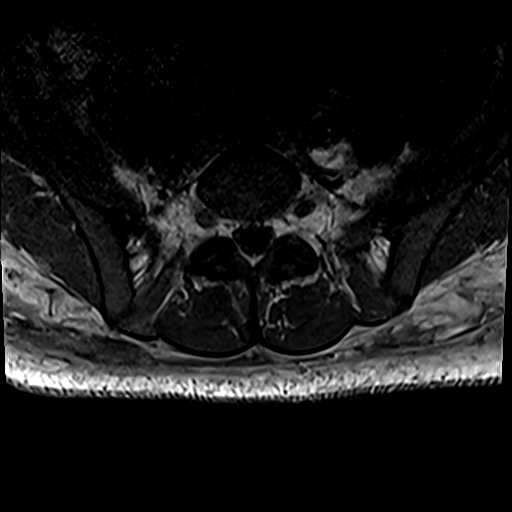
[im 11/39]
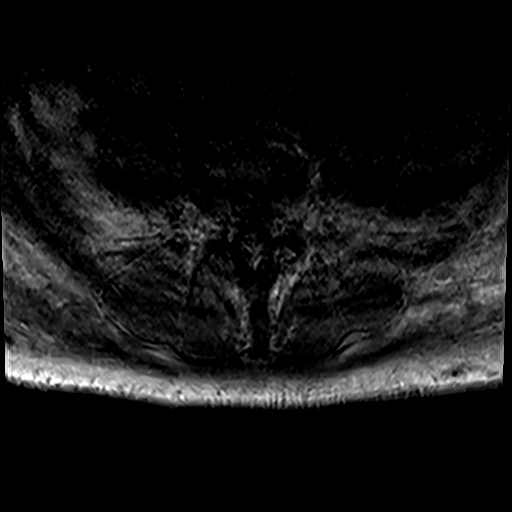
[im 17/39]
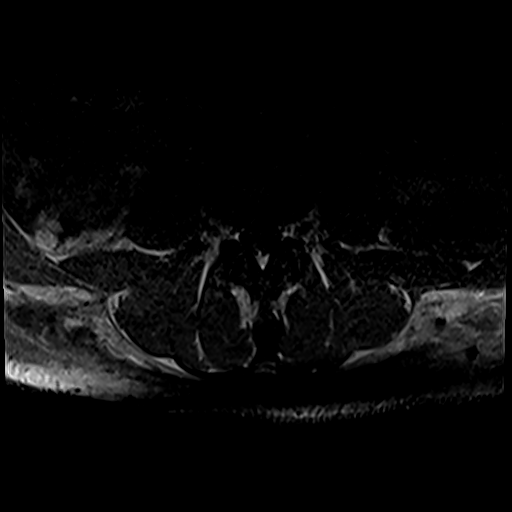
[im 20/39]
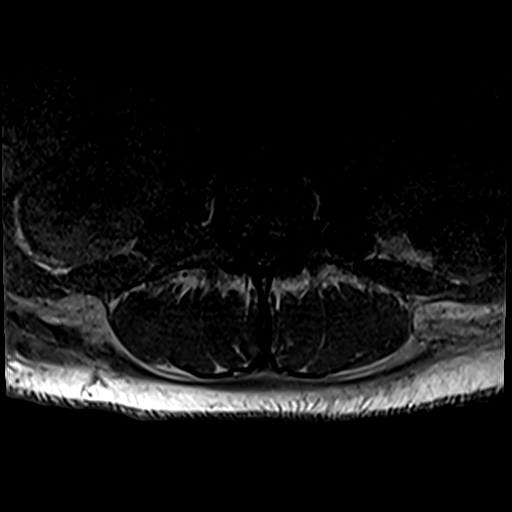
[im 22/39]
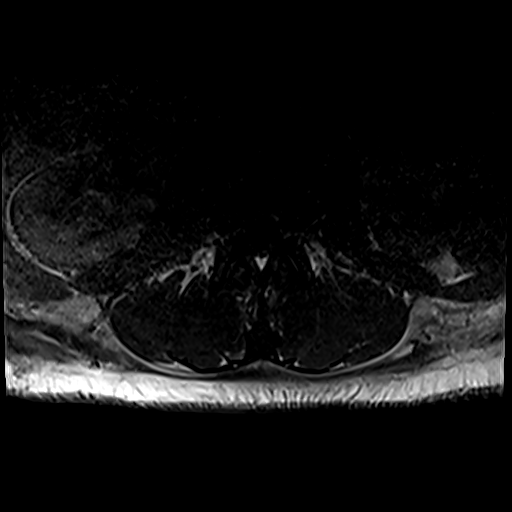
[im 28/39]
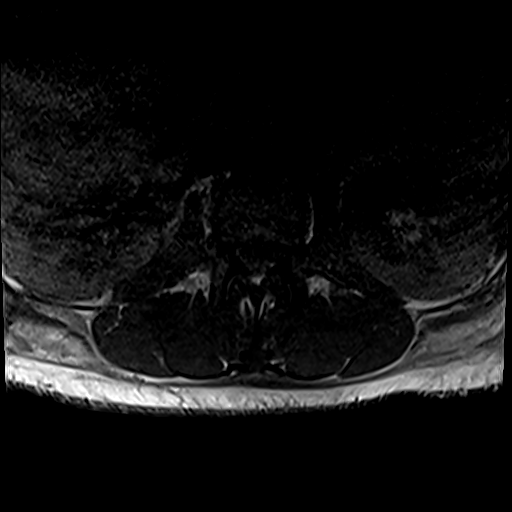
[im 33/39]
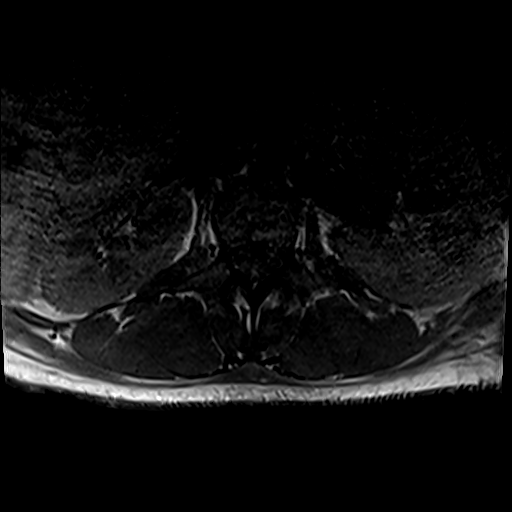
[im 39/39]
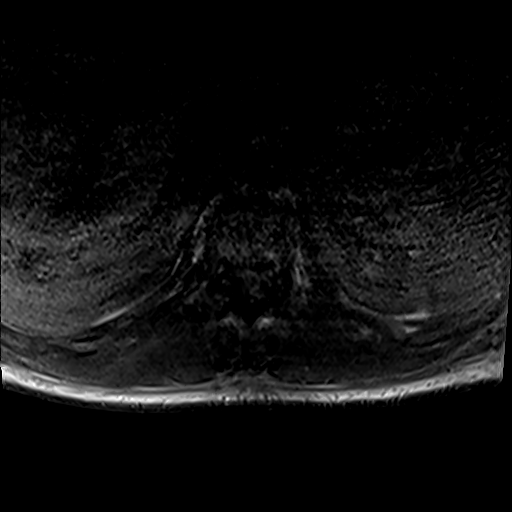

[31 of 48 positions shown; findings below may reference images not displayed]

FINDINGS: MRI CERVICAL SPINE FINDINGS

Alignment: Physiologic.

Vertebrae: No fracture, evidence of discitis, or bone lesion.

Cord: Normal signal and morphology.

Posterior Fossa, vertebral arteries, paraspinal tissues: Negative.

Disc levels:

No spinal canal or neural foraminal stenosis.

MRI THORACIC SPINE FINDINGS

Alignment:  Physiologic.

Vertebrae: No fracture, evidence of discitis, or bone lesion.

Cord:  Normal signal and morphology.

Paraspinal and other soft tissues: There are multiple areas of
consolidation within both lungs concerning for septic emboli.

Disc levels:

There is no spinal canal or neural foraminal stenosis.

MRI LUMBAR SPINE FINDINGS

Segmentation:  Standard

Alignment:  Physiologic.

Vertebrae:  No fracture, evidence of discitis, or bone lesion.

Conus medullaris and cauda equina: Conus extends to the L1 level.
Conus and cauda equina appear normal.

Paraspinal and other soft tissues: Negative

Disc levels:

No spinal canal stenosis
IMPRESSION: 1. Motion degraded examination. Additionally, postcontrast imaging
could not be obtained due to patient inability to tolerate the full
length examination.
2. No discitis-osteomyelitis or epidural abscess.
3. Multiple areas of consolidation within both lungs concerning for
septic emboli.

## 2022-02-07 IMAGING — CT CT ANGIO CHEST
2 of 7 series · 17 of 46 positions shown · IV contrast (OMNIPAQUE)
Comparison: Chest radiograph [DATE]

CLINICAL DATA: Sepsis and IV drug abuse. Fever and pleuritic chest
pain. Left shoulder pain.

EXAM:
CT ANGIOGRAPHY CHEST WITH CONTRAST
TECHNIQUE: Multidetector CT imaging of the chest was performed using the
standard protocol during bolus administration of intravenous
contrast. Multiplanar CT image reconstructions and MIPs were
obtained to evaluate the vascular anatomy.

[Series 7: thins · axial · 0.67mm/px · z∈[-327,-75]mm · 15 of 286 slices shown]
[im 17/286  lung]
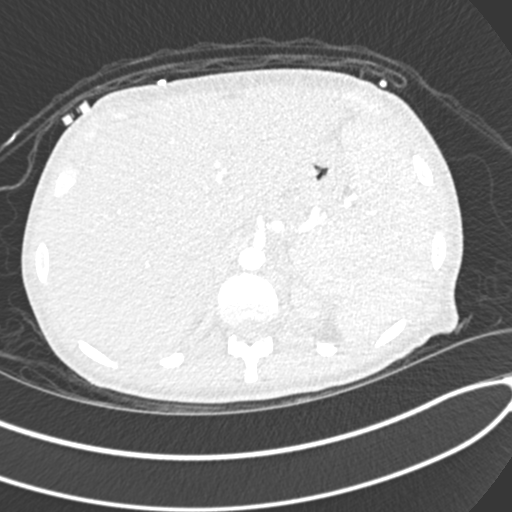
[im 34/286  soft-tissue]
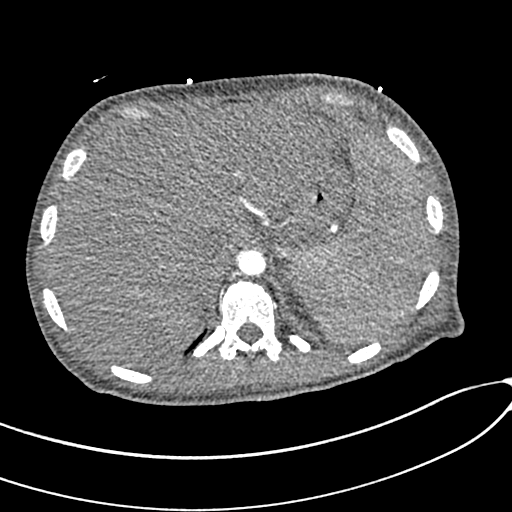
[im 51/286  lung]
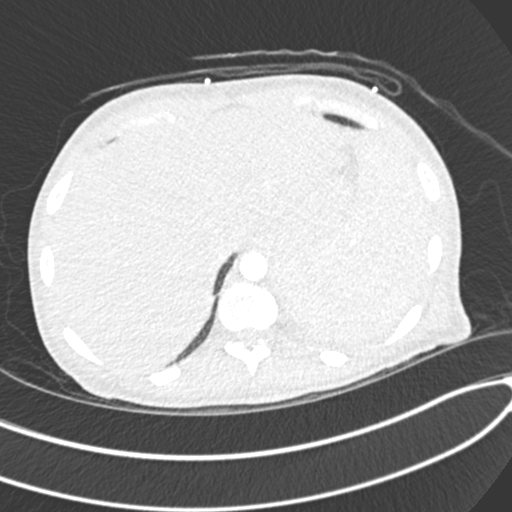
[im 68/286  soft-tissue]
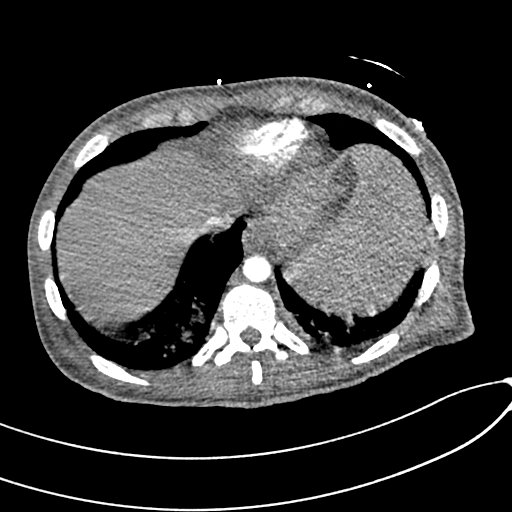
[im 84/286  lung]
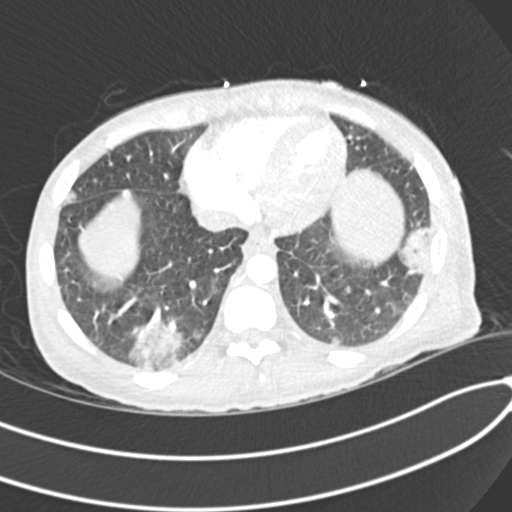
[im 101/286  soft-tissue]
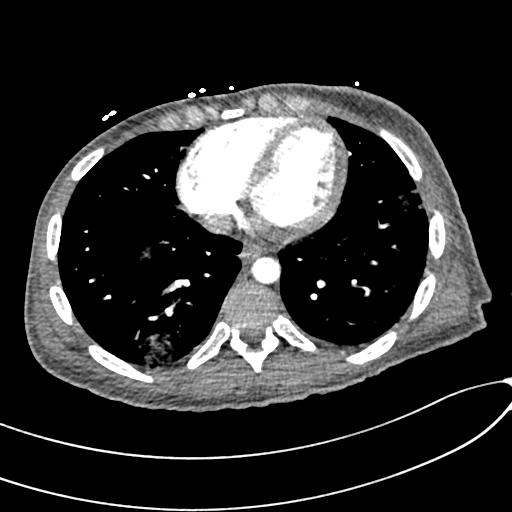
[im 118/286  lung]
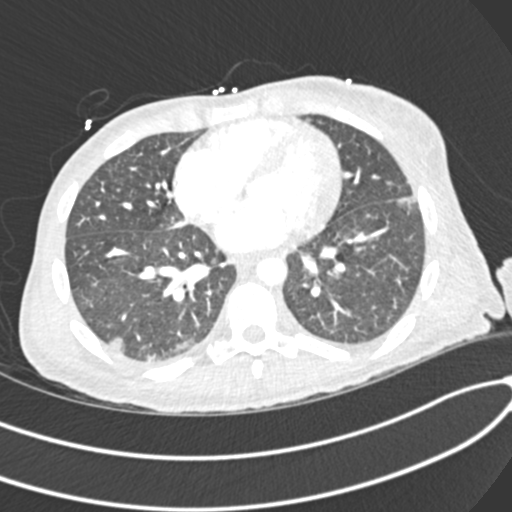
[im 151/286  soft-tissue]
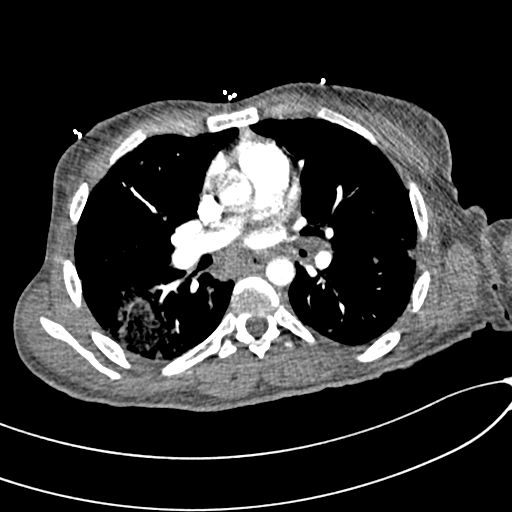
[im 168/286  lung]
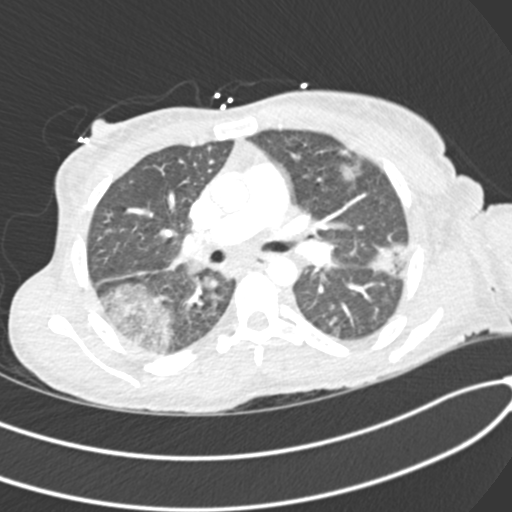
[im 185/286  soft-tissue]
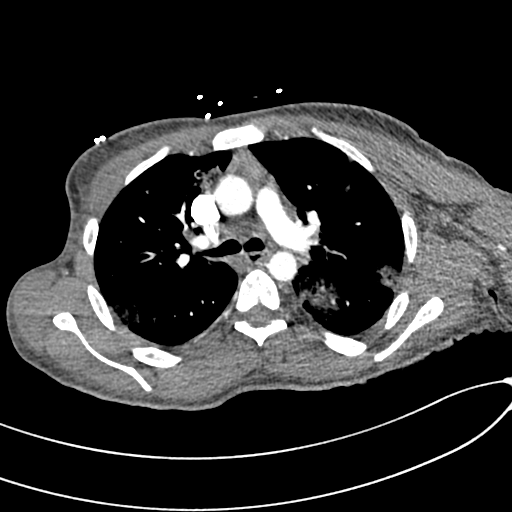
[im 202/286  lung]
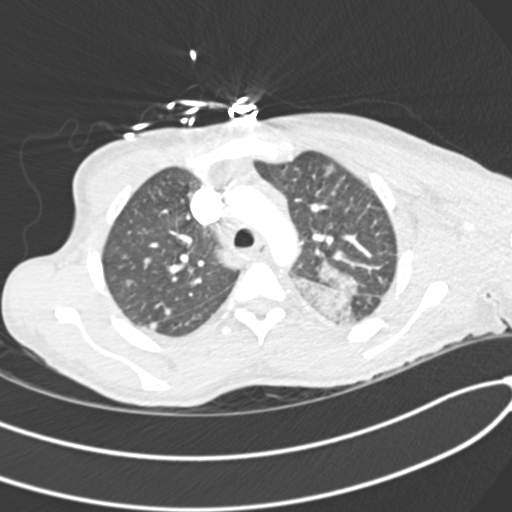
[im 218/286  soft-tissue]
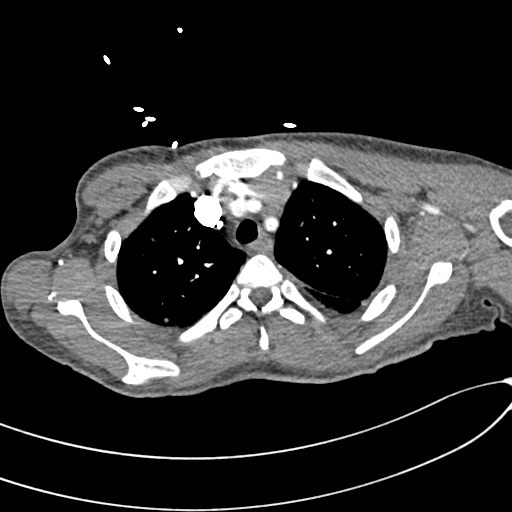
[im 235/286  lung]
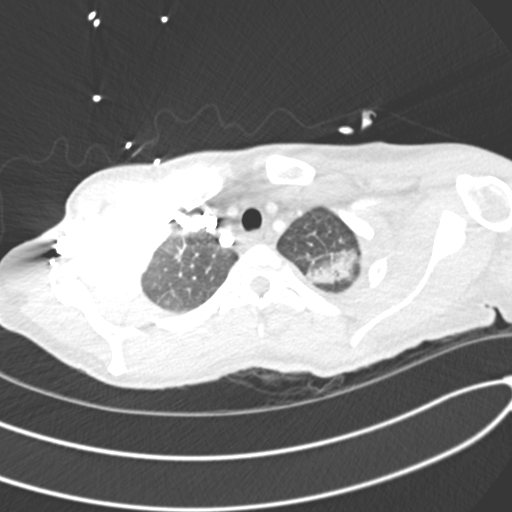
[im 252/286  soft-tissue]
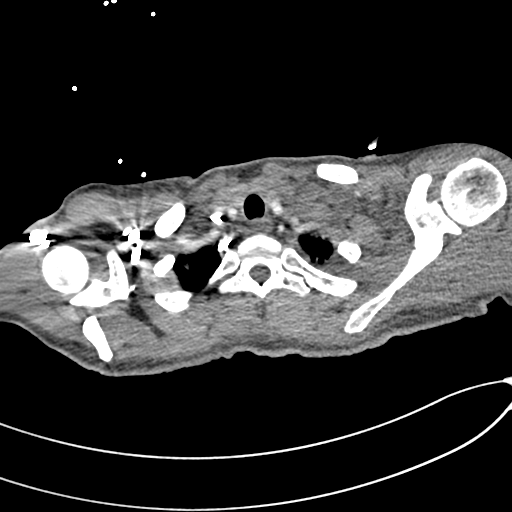
[im 269/286  lung]
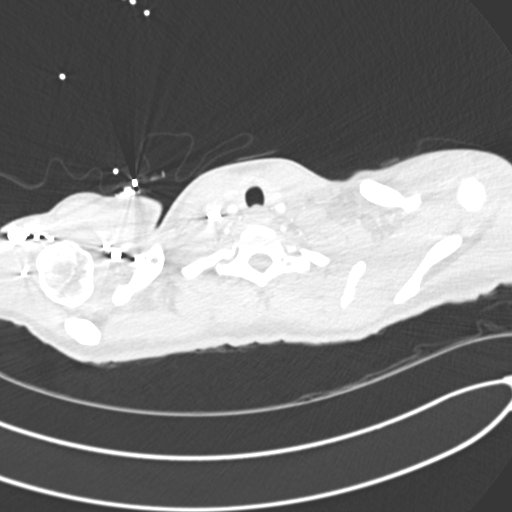

[Series 8: coronal mpr · coronal · 0.63mm/px · 2 of 69 slices shown]
[im 23/69  soft-tissue]
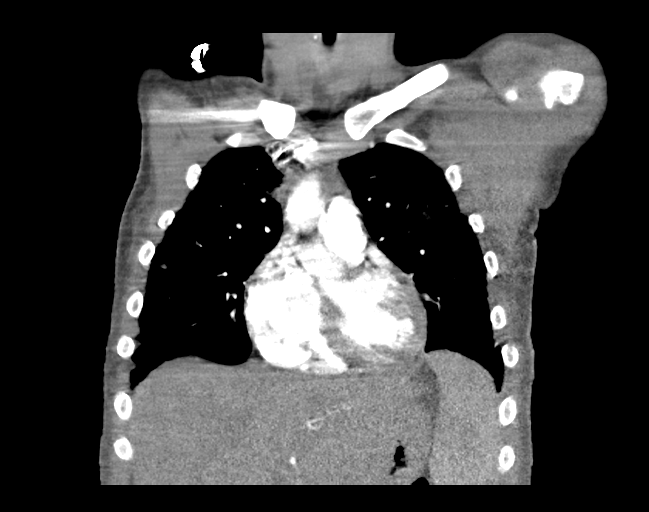
[im 46/69  soft-tissue]
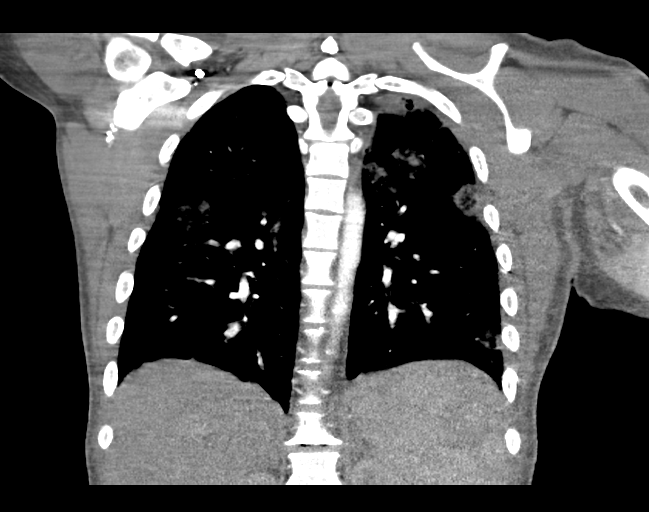

[17 of 46 positions shown; findings below may reference images not displayed]

RADIATION DOSE REDUCTION: This exam was performed according to the
departmental dose-optimization program which includes automated
exposure control, adjustment of the mA and/or kV according to
patient size and/or use of iterative reconstruction technique.

CONTRAST:  80mL OMNIPAQUE IOHEXOL 350 MG/ML SOLN
FINDINGS: Cardiovascular: No filling defect is identified in the pulmonary
arterial tree to suggest pulmonary embolus.

Mediastinum/Nodes: Ill-defined left supraclavicular adenopathy up to
1.1 cm in diameter with hazy indistinctness of tissue planes in the
left supraclavicular region. Left lower neck level V lymph node
cm in short axis on image 2 series 5. Left level IV lymph node
cm in short axis on image 7 series 5.

Indistinctly marginated suspected thoracic adenopathy including a
1.8 cm in short axis right eccentric subcarinal node, 0.9 cm left
infrahilar lymph node,, and a 0.9 cm in short axis AP window lymph
node. Right hilar node 1.1 cm in short axis on image 42 series 5.
Indistinct stranding in the mediastinal tissues.

Lungs/Pleura: Bilateral scattered pulmonary nodules of various
density along with rounded regions of bilateral airspace opacity
measuring up to about 6 cm in diameter. Some of these have internal
gas density such as the 2.9 by 1.9 cm left lower lobe airspace
opacity on image 98 of series 6, potentially from mild early
cavitation.

Secondary pulmonary lobular septal thickening at the lung apices.
Airway thickening is present, suggesting bronchitis or reactive
airways disease. Trace left pleural effusion.

Upper Abdomen: Splenomegaly.

Musculoskeletal: Diffuse subcutaneous edema.

Review of the MIP images confirms the above findings.
IMPRESSION: 1. Scattered bilateral mixed density nodules and rounded airspace
opacities scattered in both lungs, with potential mild cavitation in
one of the left lower lobe airspace opacities, and with indistinct
mediastinal and hilar adenopathy. Given the clinical context, the
appearance favors multifocal pneumonia and potentially septic
emboli. However, no filling defect is identified in the pulmonary
arterial tree to suggest bland pulmonary arterial thrombus.
Surveillance chest imaging recommended to ensure expected
improvement with therapy.
2. Ill-defined left supraclavicular adenopathy and localized fat
stranding slightly disproportionate to the diffuse subcutaneous
edema.
3. Trace left pleural effusion.
4. Airway thickening is present, suggesting bronchitis or reactive
airways disease.
5. Splenomegaly.

## 2022-02-07 IMAGING — US US ABDOMEN LIMITED
1 series · 15 of 25 positions shown · non-contrast
Comparison: None Available.

CLINICAL DATA: 30-year-old female with sepsis and abnormal LFTs.

EXAM:
ULTRASOUND ABDOMEN LIMITED RIGHT UPPER QUADRANT

[Series 1: us abdomen limited ruq mc & [facility] · 15 of 58 slices shown]
[im 1/58]
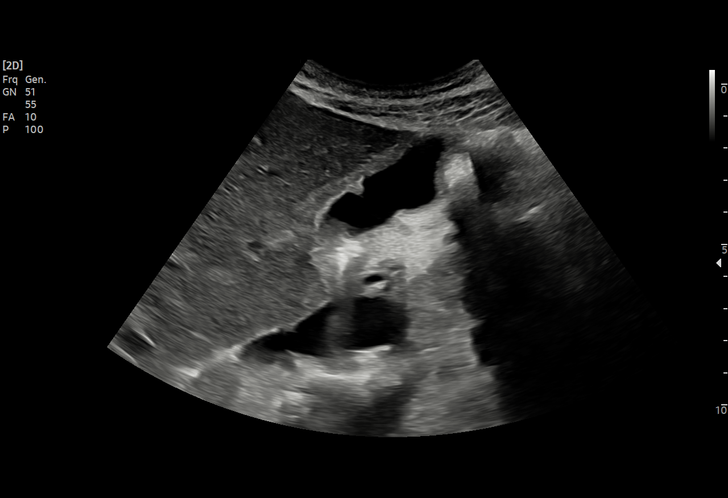
[im 5/58]
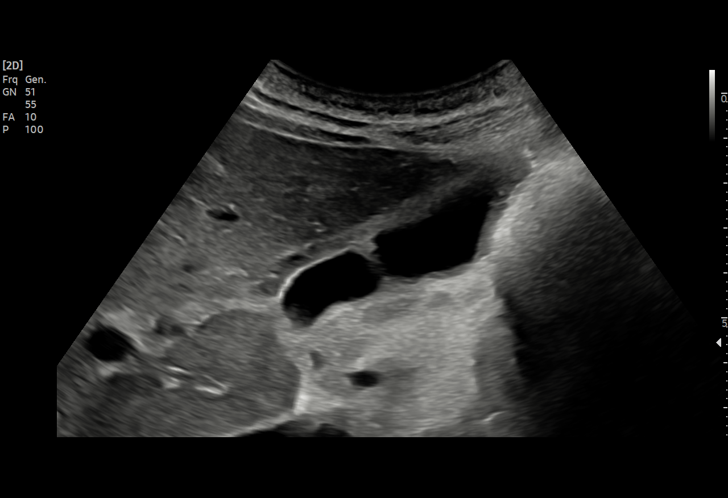
[im 10/58]
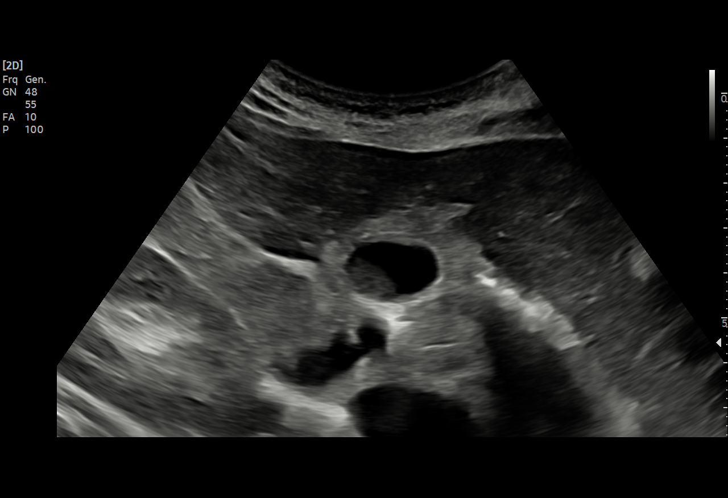
[im 12/58]
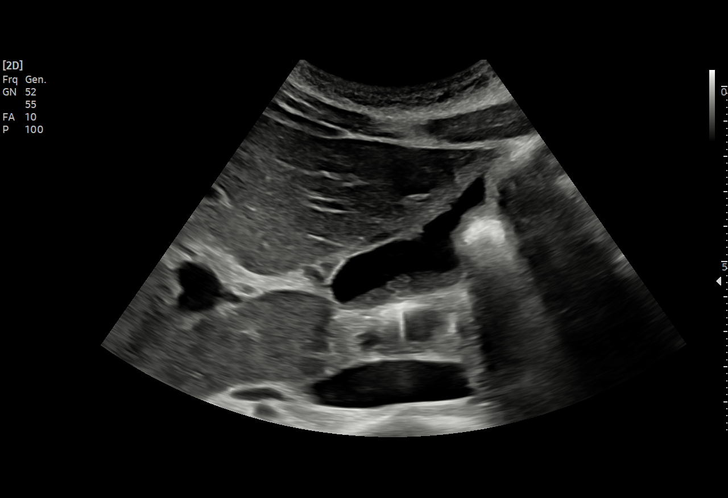
[im 17/58]
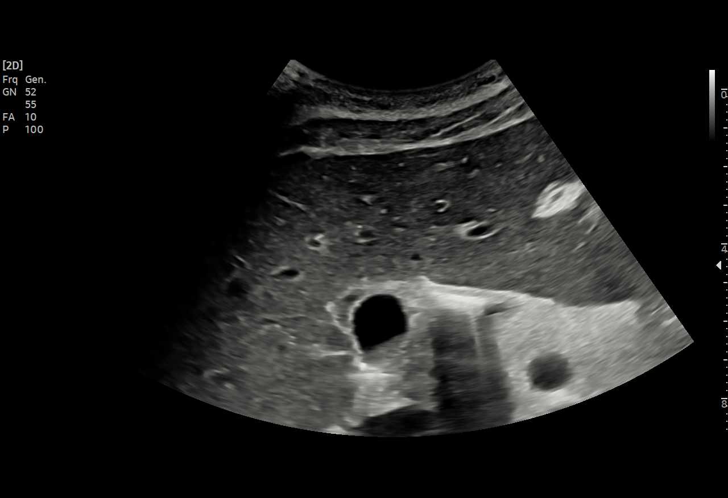
[im 22/58]
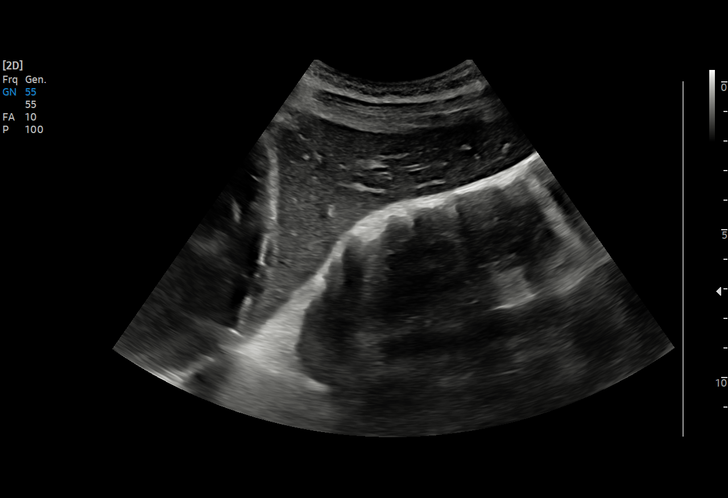
[im 24/58]
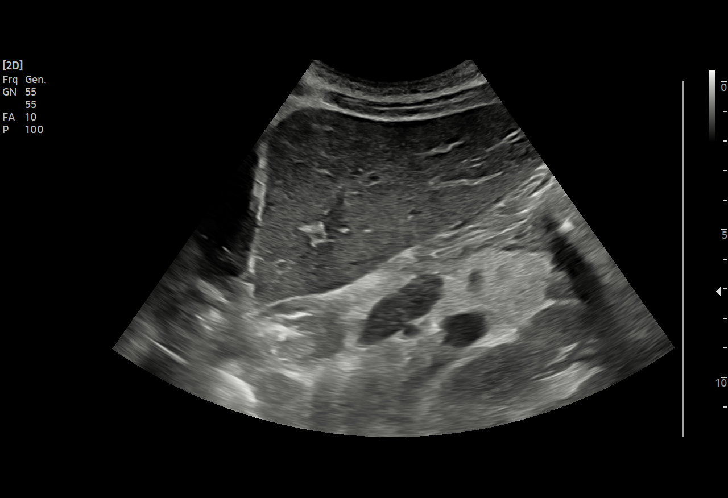
[im 29/58]
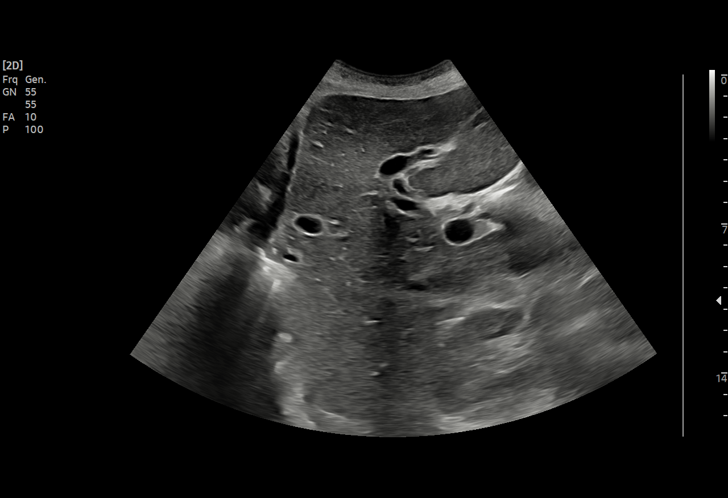
[im 34/58]
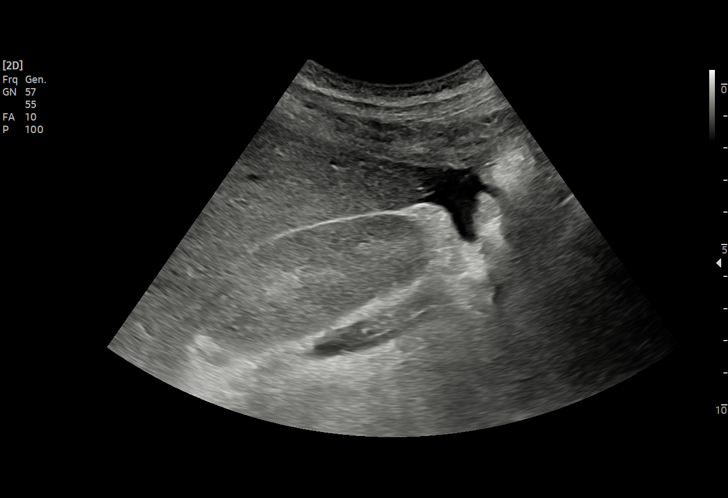
[im 36/58]
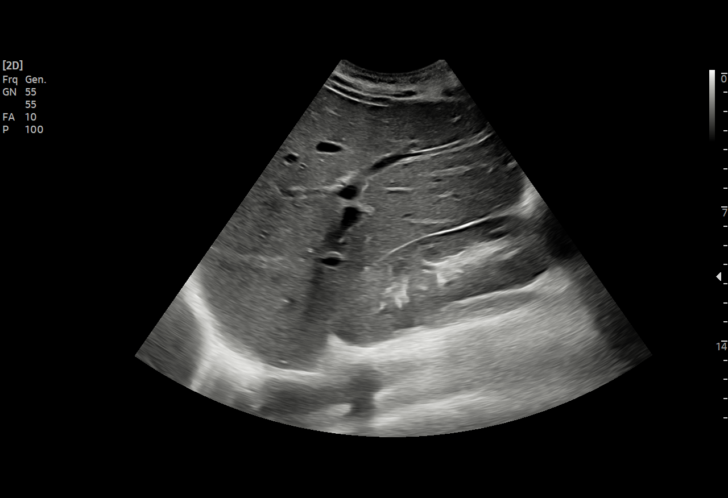
[im 41/58]
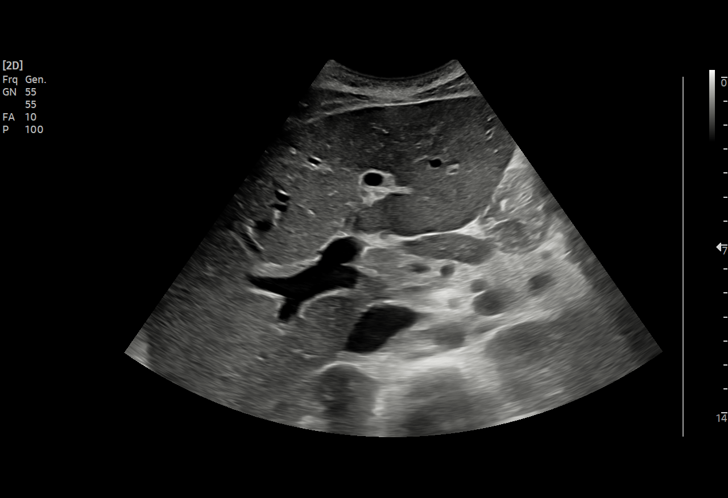
[im 46/58]
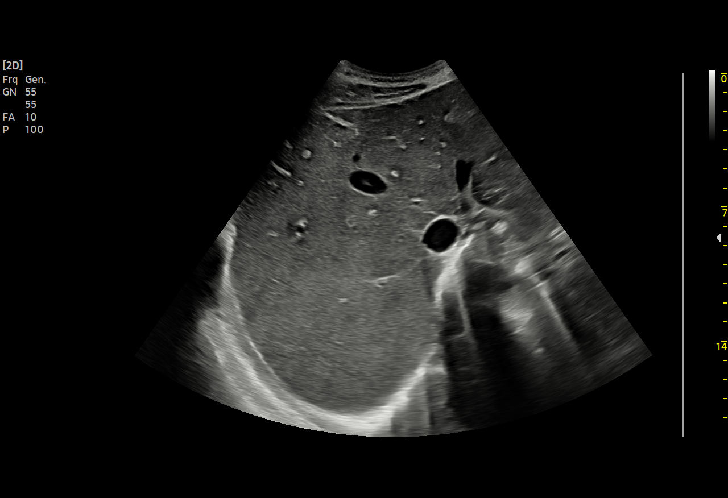
[im 48/58]
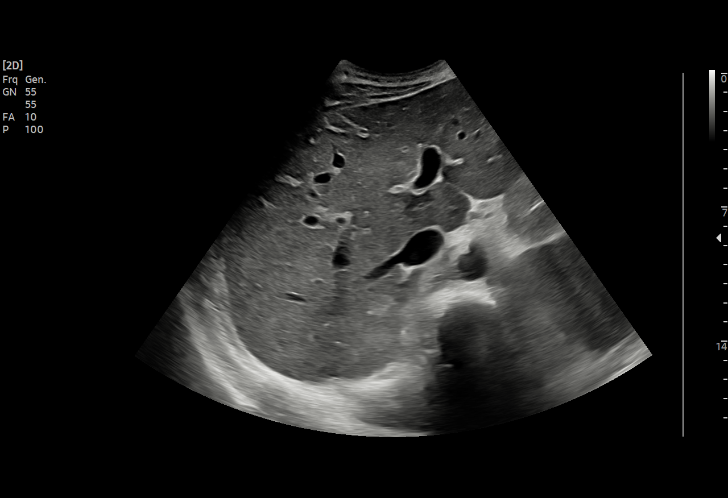
[im 53/58]
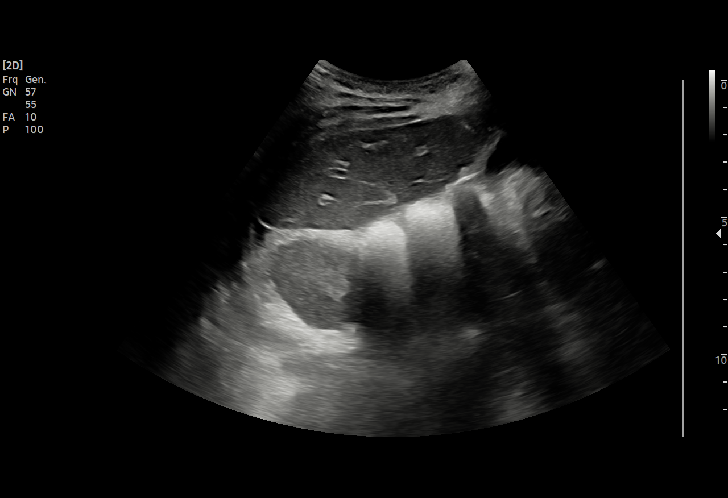
[im 58/58]
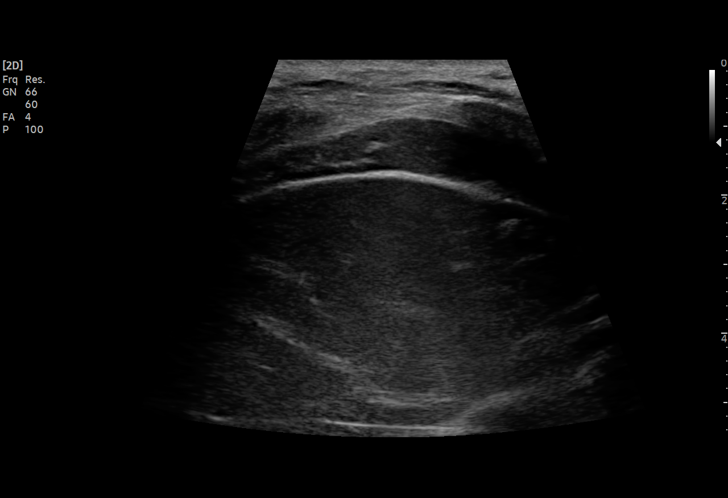

[15 of 25 positions shown; findings below may reference images not displayed]

FINDINGS: Gallbladder:

Partially contracted gallbladder but with thickened and edematous
appearing gallbladder wall (4 mm image 4). Sludge within the neck of
the gallbladder (image 5). No shadowing echogenic stones identified.
No sonographic Murphy sign elicited.

Common bile duct:

Diameter: 4 mm, normal.

Liver:

Hepatic starry sky appearance (image 8) raising the possibility of
hepatic parenchymal edema. No intrahepatic biliary ductal dilatation
suspected. No discrete liver lesion. Portal vein is patent on color
Doppler imaging with normal direction of blood flow towards the
liver.

Other: Negative visible right kidney. There is trace perihepatic
ascites (image 52).
IMPRESSION: 1. Accentuated appearance of the portal triads which can be seen in
the setting of hepatic parenchymal edema. There is trace perihepatic
ascites.
2. Partially contracted gallbladder with edematous wall and sludge.
Gallbladder edema reactive to #1 seems more likely than acalculus
cholecystitis.
3. No evidence of biliary duct obstruction.

## 2022-02-07 IMAGING — MR MR SHOULDER*L* WO/W CM
7 series · 36 of 40 positions shown · IV contrast (gadavist)
Comparison: X-ray [DATE]

CLINICAL DATA: IV drug use.  Concern for left shoulder infection

EXAM:
MRI OF THE LEFT SHOULDER WITHOUT AND WITH CONTRAST
TECHNIQUE: Multiplanar, multisequence MR imaging of the left shoulder was
performed before and after the administration of intravenous
contrast.
CONTRAST:  5mL GADAVIST GADOBUTROL 1 MMOL/ML IV SOLN

[Series 6: T2 fat-sat · axial · left · 4.0mm · 0.62mm/px · z∈[+1,+94]mm · 5 of 23 slices shown (1 of 3)]
[im 1/23]
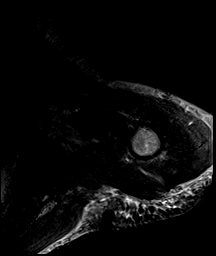
[im 6/23]
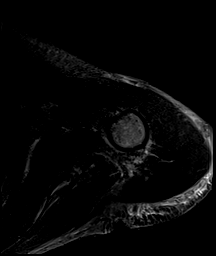
[im 12/23]
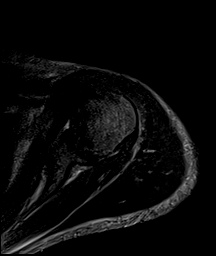
[im 17/23]
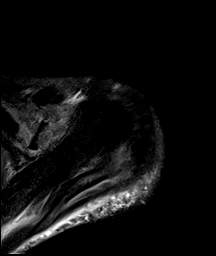
[im 23/23]
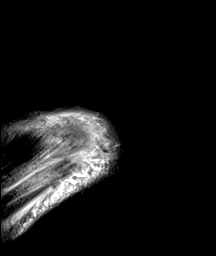

[Series 7: T2 fat-sat · oblique · left · 4.0mm · 0.50mm/px · 5 of 24 slices shown (2 of 3)]
[im 1/24]
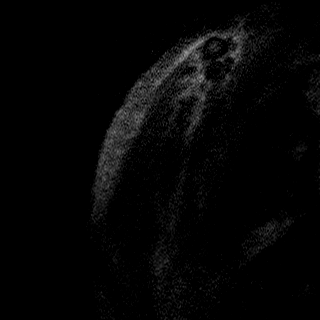
[im 6/24]
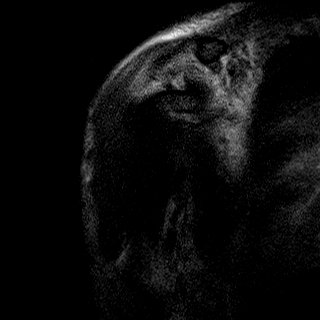
[im 12/24]
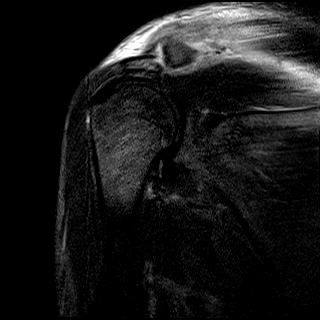
[im 18/24]
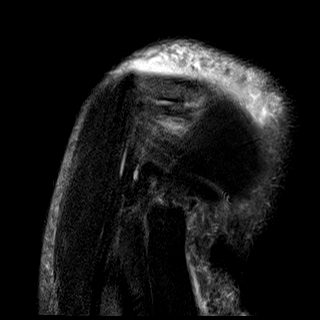
[im 24/24]
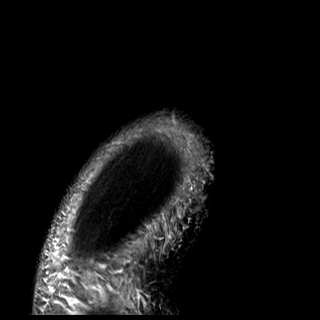

[Series 8: PD · oblique · left · 4.0mm · 0.50mm/px · 6 of 24 slices shown]
[im 1/24]
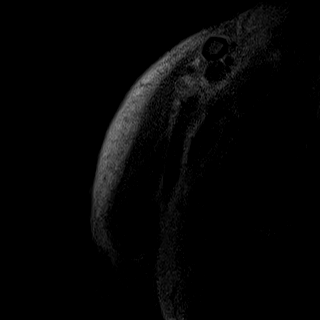
[im 5/24]
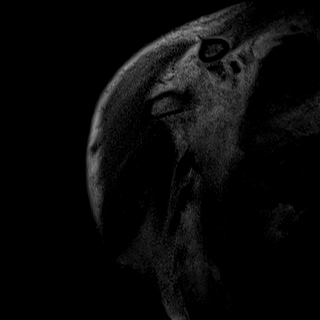
[im 10/24]
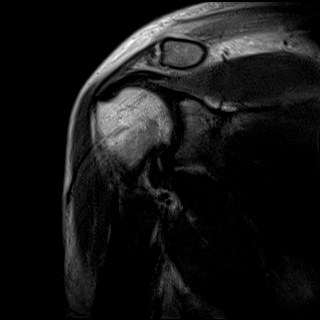
[im 14/24]
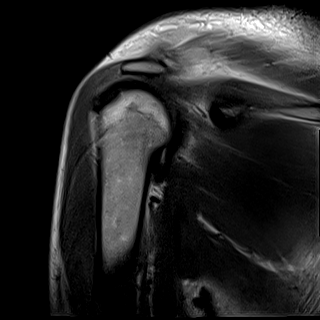
[im 19/24]
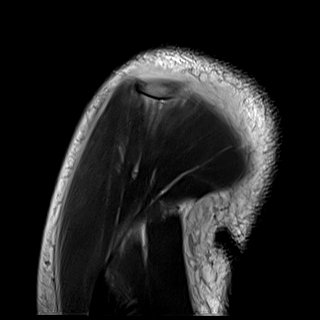
[im 24/24]
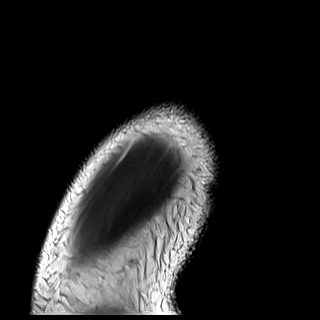

[Series 9: T1 · oblique · left · 3.0mm · 0.59mm/px · 6 of 25 slices shown]
[im 1/25]
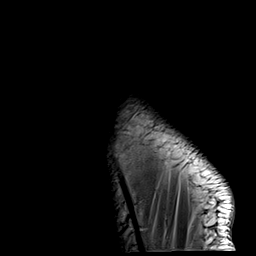
[im 5/25]
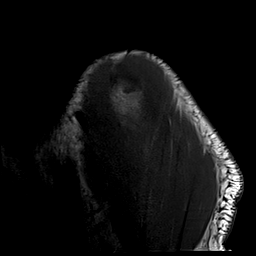
[im 10/25]
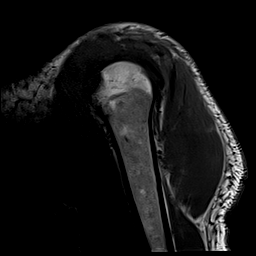
[im 15/25]
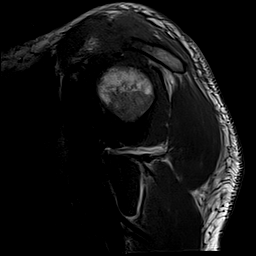
[im 20/25]
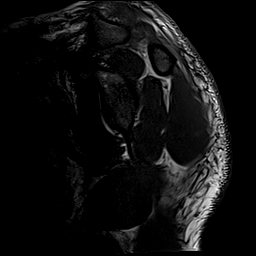
[im 25/25]
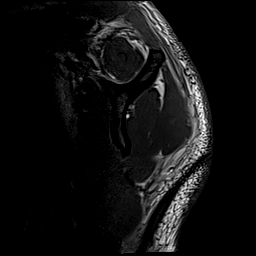

[Series 10: T2 fat-sat · oblique · left · 3.0mm · 0.59mm/px · 6 of 25 slices shown (3 of 3)]
[im 1/25]
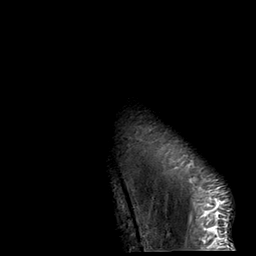
[im 5/25]
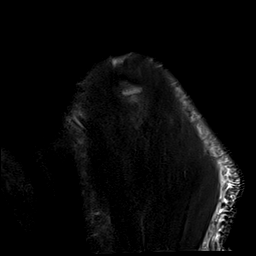
[im 10/25]
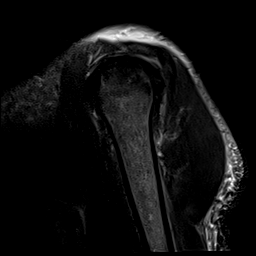
[im 15/25]
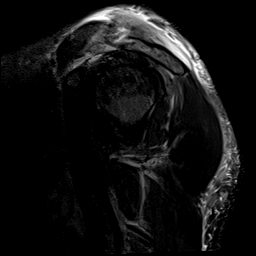
[im 20/25]
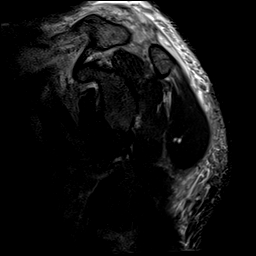
[im 25/25]
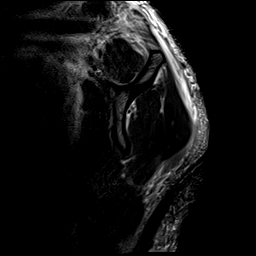

[Series 11: T1 fat-sat · axial · non-contrast · left · 4.0mm · 0.31mm/px · z∈[-28,+52]mm · 6 of 23 slices shown]
[im 1/23]
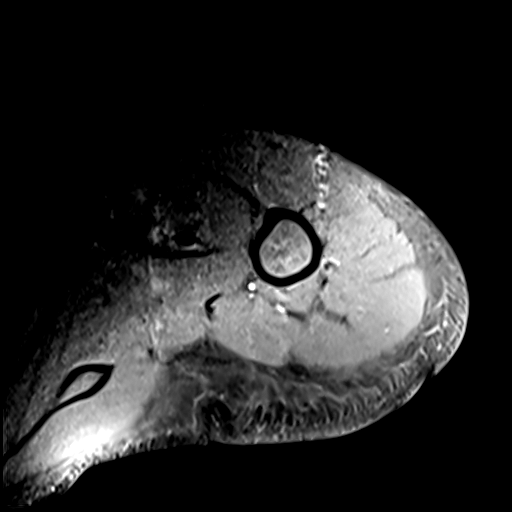
[im 5/23]
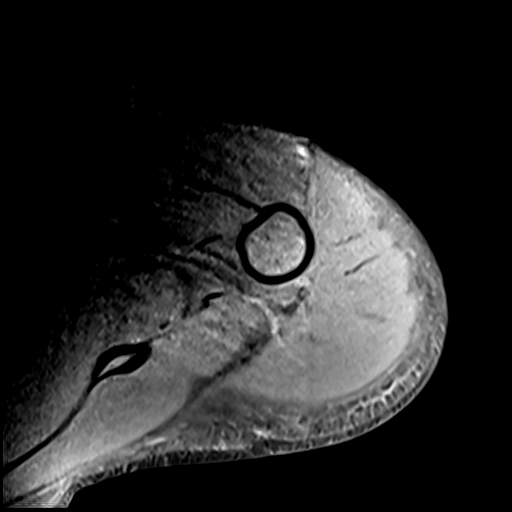
[im 9/23]
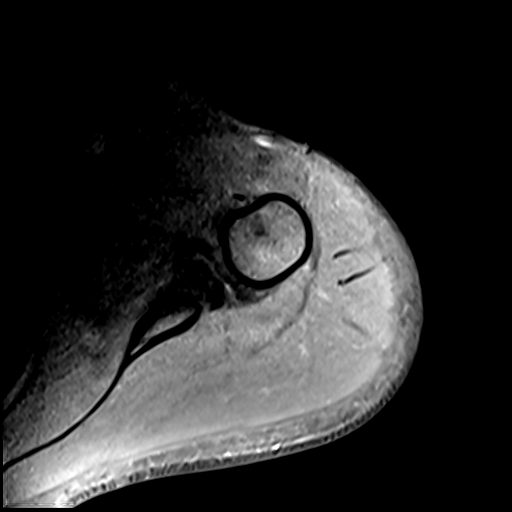
[im 14/23]
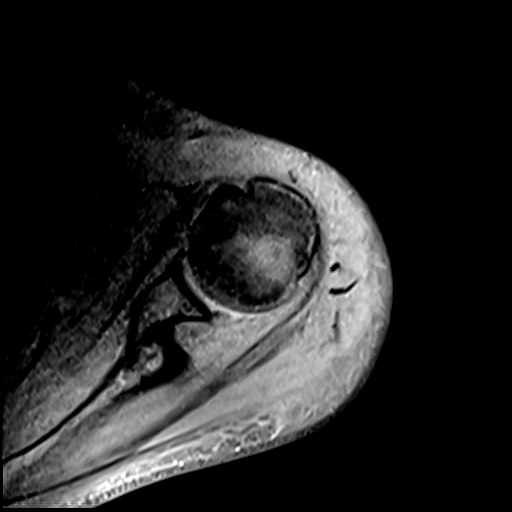
[im 18/23]
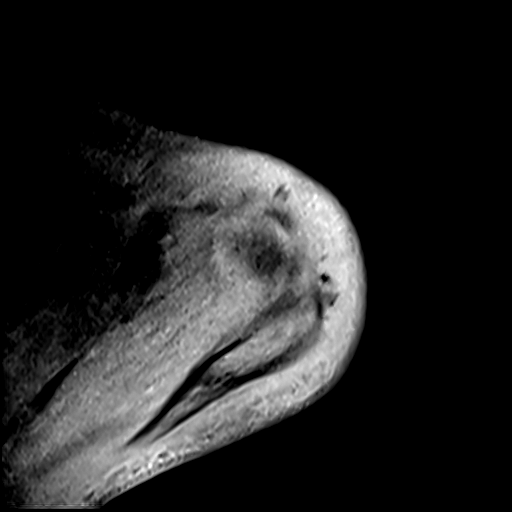
[im 23/23]
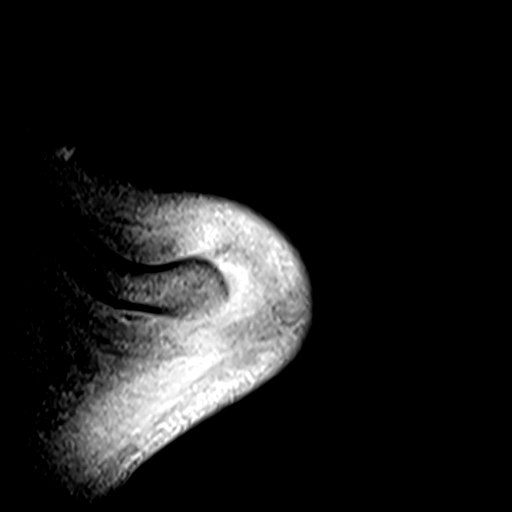

[Series 12: T1 fat-sat post-contrast · axial · left · 4.0mm · 0.31mm/px · z∈[-22,-6]mm · 2 of 23 slices shown]
[im 1/23]
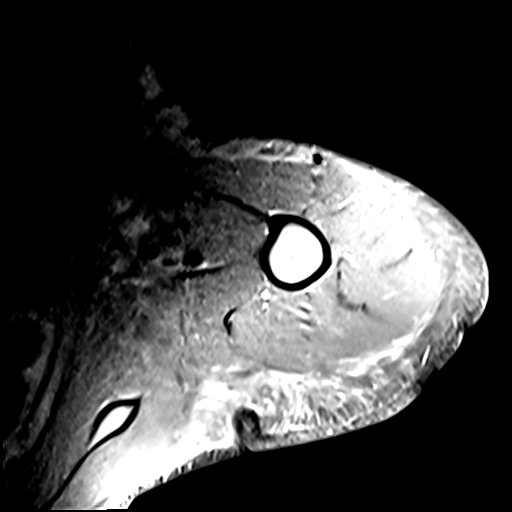
[im 5/23]
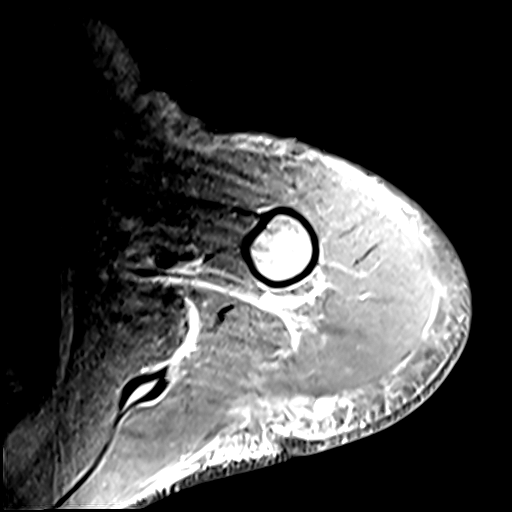

[36 of 40 positions shown; findings below may reference images not displayed]

FINDINGS: Technical Note: Despite efforts by the technologist and patient,
motion artifact is present on today's exam and could not be
eliminated. This reduces exam sensitivity and specificity.

Rotator cuff: The supraspinatus, infraspinatus, subscapularis, and
teres minor tendons are intact.

Muscles: Intramuscular edema within the proximal aspect of the
anterior deltoid muscle adjacent to the acromion process. Preserved
bulk and signal intensity of the rotator cuff musculature without
edema, atrophy, or fatty infiltration.

Biceps long head:  Intact and appropriately positioned.

Acromioclavicular Joint: Small acromioclavicular joint effusion with
peripheral enhancement and surrounding soft tissue edema. No
significant fluid within the subacromial-subdeltoid bursal space.

Glenohumeral Joint: No joint effusion. No cartilage defect.

Labrum: Grossly intact although evaluation is limited in the absence
of intra-articular fluid. No paralabral cyst.

Bones: Mild subchondral bone marrow edema within the distal clavicle
and adjacent acromion without a well-defined erosion. No definite
marrow replacement. Distal clavicle is slightly elevated relative to
the acromion without evidence of acute injury. Acromioclavicular
ligaments appear thickened, but intact. No additional sites of bone
marrow edema. No acute fracture or dislocation.

Other: Diffuse soft tissue swelling about the shoulder with
overlying subcutaneous edema and ill-defined fluid. No well-defined
or rim enhancing fluid collections are seen at this time. Included
portion of the left lung field demonstrates multifocal airspace
consolidations compatible with known pneumonia/septic emboli.
IMPRESSION: 1. Small acromioclavicular joint effusion with peripheral
enhancement and surrounding soft tissue edema. Findings are
suspicious for septic arthritis in the setting of known systemic
infection.
2. Mild subchondral bone marrow edema within the distal clavicle and
adjacent acromion without a well-defined erosion. Findings may
reflect reactive osteitis versus early acute osteomyelitis.
3. Diffuse soft tissue swelling about the shoulder with ill-defined
fluid. No well-defined or rim enhancing fluid collections seen at
this time.
4. No evidence of septic arthritis of the glenohumeral joint.
5. Multifocal right lung consolidations compatible with known
multifocal pneumonia/septic emboli.

## 2022-02-07 MED ORDER — SODIUM CHLORIDE (PF) 0.9 % IJ SOLN
INTRAMUSCULAR | Status: AC
  Filled 2022-02-07: qty 50

## 2022-02-07 MED ORDER — MUPIROCIN 2 % EX OINT
TOPICAL_OINTMENT | Freq: Two times a day (BID) | CUTANEOUS | Status: DC
Start: 2022-02-07 — End: 2022-02-27
  Administered 2022-02-12 – 2022-02-22 (×3): 1 via NASAL
  Filled 2022-02-07 (×9): qty 22

## 2022-02-07 MED ORDER — VANCOMYCIN HCL 1.25 G IV SOLR
1250.0000 mg | INTRAVENOUS | Status: DC
  Filled 2022-02-07: qty 25

## 2022-02-07 MED ORDER — GADOBUTROL 1 MMOL/ML IV SOLN
5.0000 mL | Freq: Once | INTRAVENOUS | Status: AC | PRN
  Administered 2022-02-07: 5 mL via INTRAVENOUS

## 2022-02-07 MED ORDER — HYDROMORPHONE HCL 1 MG/ML IJ SOLN
1.0000 mg | Freq: Once | INTRAMUSCULAR | Status: AC
  Administered 2022-02-07: 1 mg via INTRAVENOUS
  Filled 2022-02-07: qty 1

## 2022-02-07 MED ORDER — METRONIDAZOLE 500 MG/100ML IV SOLN
500.0000 mg | Freq: Two times a day (BID) | INTRAVENOUS | Status: DC
  Administered 2022-02-07: 500 mg via INTRAVENOUS
  Filled 2022-02-07: qty 100

## 2022-02-07 MED ORDER — VANCOMYCIN HCL 1250 MG/250ML IV SOLN
1250.0000 mg | INTRAVENOUS | Status: DC
  Administered 2022-02-07 – 2022-02-08 (×2): 1250 mg via INTRAVENOUS
  Filled 2022-02-07 (×2): qty 250

## 2022-02-07 MED ORDER — HYDROMORPHONE HCL 1 MG/ML IJ SOLN
2.0000 mg | INTRAMUSCULAR | Status: DC | PRN
  Administered 2022-02-07 – 2022-02-08 (×7): 2 mg via INTRAVENOUS
  Filled 2022-02-07 (×7): qty 2

## 2022-02-07 MED ORDER — LACTATED RINGERS IV BOLUS
2000.0000 mL | Freq: Once | INTRAVENOUS | Status: AC
  Administered 2022-02-07: 2000 mL via INTRAVENOUS

## 2022-02-07 MED ORDER — SODIUM CHLORIDE 0.9% FLUSH
3.0000 mL | Freq: Two times a day (BID) | INTRAVENOUS | Status: DC
  Administered 2022-02-07 – 2022-02-26 (×33): 3 mL via INTRAVENOUS

## 2022-02-07 MED ORDER — ORAL CARE MOUTH RINSE
15.0000 mL | Freq: Two times a day (BID) | OROMUCOSAL | Status: DC
  Administered 2022-02-07 – 2022-02-14 (×12): 15 mL via OROMUCOSAL

## 2022-02-07 MED ORDER — POTASSIUM CHLORIDE CRYS ER 20 MEQ PO TBCR
40.0000 meq | EXTENDED_RELEASE_TABLET | Freq: Once | ORAL | Status: AC
  Administered 2022-02-07: 40 meq via ORAL
  Filled 2022-02-07: qty 2

## 2022-02-07 MED ORDER — IBUPROFEN 200 MG PO TABS
600.0000 mg | ORAL_TABLET | Freq: Four times a day (QID) | ORAL | Status: DC | PRN
  Administered 2022-02-07 – 2022-02-08 (×2): 600 mg via ORAL
  Filled 2022-02-07 (×2): qty 3

## 2022-02-07 MED ORDER — CHLORHEXIDINE GLUCONATE CLOTH 2 % EX PADS
6.0000 | MEDICATED_PAD | Freq: Every day | CUTANEOUS | Status: DC
  Administered 2022-02-07 – 2022-02-16 (×8): 6 via TOPICAL

## 2022-02-07 MED ORDER — ENOXAPARIN SODIUM 40 MG/0.4ML IJ SOSY
40.0000 mg | PREFILLED_SYRINGE | INTRAMUSCULAR | Status: DC
  Administered 2022-02-07 – 2022-02-14 (×7): 40 mg via SUBCUTANEOUS
  Filled 2022-02-07 (×8): qty 0.4

## 2022-02-07 MED ORDER — ALPRAZOLAM 0.5 MG PO TABS
0.5000 mg | ORAL_TABLET | Freq: Three times a day (TID) | ORAL | Status: DC | PRN
  Administered 2022-02-07 – 2022-02-09 (×4): 0.5 mg via ORAL
  Filled 2022-02-07 (×5): qty 1

## 2022-02-07 MED ORDER — HYDROMORPHONE HCL 1 MG/ML IJ SOLN
1.0000 mg | INTRAMUSCULAR | Status: DC | PRN
  Administered 2022-02-07 (×4): 1 mg via INTRAVENOUS
  Filled 2022-02-07 (×4): qty 1

## 2022-02-07 MED ORDER — KETOROLAC TROMETHAMINE 30 MG/ML IJ SOLN: 15.0000 mg | Freq: Four times a day (QID) | INTRAMUSCULAR | Status: DC

## 2022-02-07 MED ORDER — ONDANSETRON HCL 4 MG/2ML IJ SOLN
4.0000 mg | Freq: Four times a day (QID) | INTRAMUSCULAR | Status: DC | PRN
Start: 2022-02-07 — End: 2022-02-27
  Administered 2022-02-17 – 2022-02-23 (×5): 4 mg via INTRAVENOUS
  Filled 2022-02-07 (×6): qty 2

## 2022-02-07 MED ORDER — GUAIFENESIN 100 MG/5ML PO LIQD
10.0000 mL | ORAL | Status: DC | PRN
  Administered 2022-02-07 – 2022-02-11 (×2): 10 mL via ORAL
  Filled 2022-02-07 (×2): qty 10

## 2022-02-07 MED ORDER — IOHEXOL 350 MG/ML SOLN
80.0000 mL | Freq: Once | INTRAVENOUS | Status: AC | PRN
  Administered 2022-02-07: 80 mL via INTRAVENOUS

## 2022-02-07 MED ORDER — SODIUM CHLORIDE 0.9 % IV SOLN
2.0000 g | Freq: Three times a day (TID) | INTRAVENOUS | Status: DC
  Administered 2022-02-07: 2 g via INTRAVENOUS
  Filled 2022-02-07: qty 12.5
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
## 2022-02-07 NOTE — H&P (Addendum)
History and Physical    Carol Rivera EEF:[id number] DOB: [birth year] DOA: 02/06/2022  PCP: Pcp, No   Patient coming from: Home   Chief Complaint: Fever, chest pain, cough, left shoulder pain   HPI: Carol Rivera is a 31 y.o. female with medical history significant for childhood asthma and intravenous drug abuse, now presenting to the emergency department with fevers, pleuritic chest pain, productive cough, and severe left shoulder pain.  Patient reports of 4 days of fevers and general aches with pleuritic pain involving the central chest, cough productive of thick greenish sputum, and severe left shoulder pain without trauma or inciting event.  ED Course: Upon arrival to the ED, patient is found to be febrile to 39.3 C, saturating well on room air, tachycardic to 883, and with systolic blood pressure of 100 and greater.  Chest x-ray and plain radiographs of the left shoulder are negative for acute findings.  Chemistry panel notable for sodium 128, alkaline phosphatase 163, AST 257, ALT 454, and total bilirubin 6.9.  CBC features a leukocytosis to 17,900.  Lactic acid is 4.  COVID and influenza PCR negative.  Blood and urine cultures were collected and the patient was treated with 1.75 L of LR, Flagyl, vancomycin, and cefepime.  Review of Systems:  All other systems reviewed and apart from HPI, are negative.  Past Medical History:  Diagnosis Date   Asthma     History reviewed. No pertinent surgical history.  Social History:   reports that she has been smoking. She has been smoking an average of 1 pack per day. She does not have any smokeless tobacco history on file. She reports that she does not drink alcohol and does not use drugs.  Allergies  Allergen Reactions   Doxycycline Other (See Comments)    REACTION: Joint Pain    History reviewed. No pertinent family history.   Prior to Admission medications   Medication Sig Start Date End Date Taking? Authorizing Provider   benzonatate (TESSALON) 100 MG capsule Take 1 capsule (100 mg total) by mouth 3 (three) times daily as needed for cough. 12/01/15   Ward, Delice Bison, DO  DM-Phenylephrine-Acetaminophen (ROBITUSSIN COLD+FLU DAYTIME) 10-5-325 MG CAPS Take 2 capsules by mouth once as needed (for cough and cold symptoms).    [provider]  ondansetron (ZOFRAN ODT) 4 MG disintegrating tablet Take 1 tablet (4 mg total) by mouth every 8 (eight) hours as needed for nausea or vomiting. 12/01/15   Ward, Delice Bison, DO  predniSONE (DELTASONE) 20 MG tablet Take 3 tablets (60 mg total) by mouth daily. 12/01/15   Ward, Delice Bison, DO    Physical Exam: Vitals:   02/06/22 2230 02/06/22 2255 02/07/22 0000 02/07/22 0400  BP: 102/70  111/76 120/75  Pulse: (!) 118  (!) 116 (!) 125  Resp: 16  (!) 25 (!) 30  Temp:  99.2 F (37.3 C)  (!) 101.3 F (38.5 C)  TempSrc:  Oral  Axillary  SpO2: 98%  100% 100%  Weight:      Height:        Constitutional: NAD, calm  Eyes: PERTLA, lids and conjunctivae normal ENMT: Mucous membranes are moist. Posterior pharynx clear of any exudate or lesions.   Neck: supple, no masses  Respiratory: no wheezing, no crackles. No accessory muscle use.  Cardiovascular: S1 & S2 heard, regular rate and rhythm. No extremity edema. No significant JVD. Abdomen: No distension, soft. Bowel sounds active.  Musculoskeletal: no clubbing / cyanosis. Left shoulder ROM  severely limited by pain.   Skin: no significant rashes, lesions, ulcers. Warm, dry, well-perfused. Neurologic: CN 2-12 grossly intact. Moving all extremities. Alert and oriented.  Psychiatric: calm. Cooperative.    Labs and Imaging on Admission: I have personally reviewed following labs and imaging studies  CBC: Recent Labs  Lab 02/06/22 2020  WBC 17.9*  NEUTROABS 14.9*  HGB 12.2  HCT 37.0  MCV 76.1*  PLT 166   Basic Metabolic Panel: Recent Labs  Lab 02/06/22 2020  NA 128*  K 4.8  CL 92*  CO2 24  GLUCOSE 118*  BUN 12   CREATININE 0.55  CALCIUM 8.3*   GFR: Estimated Creatinine Clearance: 73.2 mL/min (by C-G formula based on SCr of 0.55 mg/dL). Liver Function Tests: Recent Labs  Lab 02/06/22 2020  AST 257*  ALT 454*  ALKPHOS 163*  BILITOT 6.9*  PROT 7.8  ALBUMIN 3.0*   No results for input(s): LIPASE, AMYLASE in the last 168 hours. No results for input(s): AMMONIA in the last 168 hours. Coagulation Profile: Recent Labs  Lab 02/06/22 2020  INR 1.3*   Cardiac Enzymes: No results for input(s): CKTOTAL, CKMB, CKMBINDEX, TROPONINI in the last 168 hours. BNP (last 3 results) No results for input(s): PROBNP in the last 8760 hours. HbA1C: No results for input(s): HGBA1C in the last 72 hours. CBG: No results for input(s): GLUCAP in the last 168 hours. Lipid Profile: No results for input(s): CHOL, HDL, LDLCALC, TRIG, CHOLHDL, LDLDIRECT in the last 72 hours. Thyroid Function Tests: No results for input(s): TSH, T4TOTAL, FREET4, T3FREE, THYROIDAB in the last 72 hours. Anemia Panel: No results for input(s): VITAMINB12, FOLATE, FERRITIN, TIBC, IRON, RETICCTPCT in the last 72 hours. Urine analysis:    Component Value Date/Time   COLORURINE YELLOW 02/07/2022 0023   APPEARANCEUR CLEAR 02/07/2022 0023   LABSPEC 1.002 (L) 02/07/2022 0023   PHURINE 6.0 02/07/2022 0023   GLUCOSEU NEGATIVE 02/07/2022 0023   HGBUR MODERATE (A) 02/07/2022 0023   BILIRUBINUR NEGATIVE 02/07/2022 0023   KETONESUR NEGATIVE 02/07/2022 0023   PROTEINUR NEGATIVE 02/07/2022 0023   UROBILINOGEN 0.2 02/24/2012 2301   NITRITE NEGATIVE 02/07/2022 0023   LEUKOCYTESUR NEGATIVE 02/07/2022 0023   Sepsis Labs: $[id number]'[email or web address]'[id number]$ (procalcitonin:4,lacticidven:4) ) Recent Results (from the past 240 hour(s))  Resp Panel by RT-PCR (Flu A&B, Covid) Peripheral     Status: None   Collection Time: 02/06/22  8:38 PM   Specimen: Peripheral; Nasopharyngeal(NP) swabs in vial transport medium  Result Value Ref Range Status   SARS Coronavirus 2 by RT  PCR NEGATIVE NEGATIVE Final    Comment: (NOTE) SARS-CoV-2 target nucleic acids are NOT DETECTED.  The SARS-CoV-2 RNA is generally detectable in upper respiratory specimens during the acute phase of infection. The lowest concentration of SARS-CoV-2 viral copies this assay can detect is 138 copies/mL. A negative result does not preclude SARS-Cov-2 infection and should not be used as the sole basis for treatment or other patient management decisions. A negative result may occur with  improper specimen collection/handling, submission of specimen other than nasopharyngeal swab, presence of viral mutation(s) within the areas targeted by this assay, and inadequate number of viral copies(<138 copies/mL). A negative result must be combined with clinical observations, patient history, and epidemiological information. The expected result is Negative.  Fact Sheet for Patients:  EntrepreneurPulse.com.au  Fact Sheet for Healthcare Providers:  IncredibleEmployment.be  This test is no t yet approved or cleared by the [place] FDA and  has been authorized for detection and/or diagnosis of SARS-CoV-2 by  FDA under an Emergency Use Authorization (EUA). This EUA will remain  in effect (meaning this test can be used) for the duration of the COVID-19 declaration under Section 564(b)(1) of the Act, 21 U.S.C.section 360bbb-3(b)(1), unless the authorization is terminated  or revoked sooner.       Influenza A by PCR NEGATIVE NEGATIVE Final   Influenza B by PCR NEGATIVE NEGATIVE Final    Comment: (NOTE) The Xpert Xpress SARS-CoV-2/FLU/RSV plus assay is intended as an aid in the diagnosis of influenza from Nasopharyngeal swab specimens and should not be used as a sole basis for treatment. Nasal washings and aspirates are unacceptable for Xpert Xpress SARS-CoV-2/FLU/RSV testing.  Fact Sheet for Patients: EntrepreneurPulse.com.au  Fact Sheet for  Healthcare Providers: IncredibleEmployment.be  This test is not yet approved or cleared by the [place] FDA and has been authorized for detection and/or diagnosis of SARS-CoV-2 by FDA under an Emergency Use Authorization (EUA). This EUA will remain in effect (meaning this test can be used) for the duration of the COVID-19 declaration under Section 564(b)(1) of the Act, 21 U.S.C. section 360bbb-3(b)(1), unless the authorization is terminated or revoked.  Performed at [facility], [place] [address], [place], [place] [address]   MRSA Next Gen by PCR, Nasal     Status: Abnormal   Collection Time: 02/07/22 12:43 AM   Specimen: Nasal Mucosa; Nasal Swab  Result Value Ref Range Status   MRSA by PCR Next Gen DETECTED (A) NOT DETECTED Final    Comment: CRITICAL RESULT CALLED TO, READ BACK BY AND VERIFIED WITH: CALLED TO ZAHOUREK,A AT 9622 ON 02/07/22 BY VAZQUEZ,J (NOTE) The GeneXpert MRSA Assay (FDA approved for NASAL specimens only), is one component of a comprehensive MRSA colonization surveillance program. It is not intended to diagnose MRSA infection nor to guide or monitor treatment for MRSA infections. Test performance is not FDA approved in patients less than 45 years old. Performed at [facility], [place] [address] [place], [place] [address]      Radiological Exams on Admission: DG Chest Port 1 View  Result Date: 02/06/2022 CLINICAL DATA:  Questionable sepsis. EXAM: PORTABLE CHEST 1 VIEW COMPARISON:  Chest x-ray 12/01/2015. FINDINGS: The heart size and mediastinal contours are within normal limits. Both lungs are clear. The visualized skeletal structures are unremarkable. IMPRESSION: No active disease. Electronically Signed   By: Ronney Asters M.D.   On: 02/06/2022 20:47   DG Shoulder Left Portable  Result Date: 02/06/2022 CLINICAL DATA:  Pain, fever, heroin abuse EXAM: LEFT SHOULDER COMPARISON:  None Available. FINDINGS:  Frontal and lateral views of the left shoulder are obtained. There are no acute or destructive bony lesions. Joint spaces are well preserved. Soft tissues are unremarkable without subcutaneous gas or radiopaque foreign body. Visualized portions of the left chest are clear. IMPRESSION: 1. Unremarkable left shoulder. Electronically Signed   By: Randa Ngo M.D.   On: 02/06/2022 20:43    EKG: Independently reviewed. Sinus tachycardia, rate 140.   Assessment/Plan   1. Sepsis  - IV drug user presenting with fever, pleuritic chest pain, productive cough, and severe atraumatic left shoulder pain and found to have WBC 17,900 with lactate >4, fever, tachycardia, and stable BP  - IE a concern though no rash or conspicuous murmur noted  - Check CT chest and MRI left shoulder, obtain 3rd set of blood culture, continue broad-spectrum antibiotics, follow cultures, trend lactate and procalcitonin, check echo if blood cultures positive or the pending imaging studies reveal  septic emboli or septic arthritis   2. Pleuritic chest pain  - Pt complains of pleuritic chest pain and productive cough  - No acute findings on CXR; EKG with sinus tachycardia, rate 140  - Check CTA chest    3. Left shoulder pain  - Severe atraumatic left shoulder pain in IV drug user with fever  - Check MRI    4. Elevated LFTs  - Alk phos 163, AST 257, ALT 454, and t bili 6.9 on admission with INR 1.3  - Check RUQ [place] and viral hepatitis panel, repeat CMP in am    5. Hyponatremia  - Serum sodium 128 on admission in setting of hypovolemia  - Continue IVF hydration and monitor    DVT prophylaxis: Lovenox  Code Status: Full  Level of Care: Level of care: Progressive Family Communication: none present  Disposition Plan:  Patient is from: home  Anticipated d/c is to: TBD  Anticipated d/c date is: 02/10/22  Patient currently: pending MRI shoulder, CT chest, RUQ [place], cultures, improvement in sepsis parameters  Consults called:  none  Admission status: Inpatient     Vianne Bulls, MD [facility]  02/07/2022, 4:29 AM

## 2022-02-07 NOTE — Consult Note (Signed)
Carol Rivera for Infectious Disease    Date of Admission:  02/06/2022     Reason for Consult: MRSA bacteremia     Referring Physician: Garwin Brothers consult  Current antibiotics: Vancomycin   ASSESSMENT:    31 y.o. female admitted with:  MRSA bacteremia: Admission blood cultures 02/06/2022 positive for MRSA in 3 out of 3 bottles. 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  TTE pending. Left shoulder pain: Concerning for disseminated infection to the site.  MRI pending. Back pain: Also concerning for disseminated infection in the setting of MRSA bacteremia. Elevated LFTs and T. bili: Right upper quadrant ultrasound showing accentuated appearance of the portal triads suggestive of hepatic parenchymal edema.  Also notable for edematous gallbladder which was felt to be reactive more so than due to acalculus cholecystitis. ? Right sided HF due to endocarditis. HCV Ab positive. Injection drug use: Reports she has been injecting heroin for the past 2 months.  No history of infections related to IVDU per her report. Severe sepsis: Due to #1.  RECOMMENDATIONS:    Continue vancomycin TTE pending.  Anticipate need for TEE Repeat blood cx ordered Agree with shoulder MRI Will obtain spinal MRI as well Consider HIDA scan to rule out acalculous cholecystitis if LFTs and T bili do not improve  Add on HCV RNA Check RPR, GC/CT Pain control now per TRH.  Discussed with patient ideally would get started on Suboxone during this admission Dr Gale Journey here this weekend   Principal Problem:   Sepsis ([place]) Active Problems:   Hyponatremia   Elevated LFTs   IVDU (intravenous drug user)   Left shoulder pain   Chest pain, pleuritic   MEDICATIONS:    Scheduled Meds:  Chlorhexidine Gluconate Cloth  6 each Topical Daily   enoxaparin (LOVENOX) injection  40 mg Subcutaneous Q24H   mouth rinse  15 mL Mouth  Rinse BID   mupirocin ointment   Nasal BID   sodium chloride (PF)       sodium chloride flush  3 mL Intravenous Q12H   Continuous Infusions:  lactated ringers     lactated ringers 150 mL/hr at 02/07/22 1107   vancomycin     PRN Meds:.guaiFENesin, HYDROmorphone (DILAUDID) injection, ibuprofen, ondansetron (ZOFRAN) IV  HPI:    Carol Rivera is a 31 y.o. female with past medical history of injection drug use presented to [facility] 02/06/2022 with fever, left shoulder pain, and general malaise for approximately 3 to 4 days prior to admission.  She also reports worsening back pain for approximately 2 weeks prior to admission as well.  In the emergency department she was found to be febrile with leukocytosis.  Heart rate was elevated.  She was started on broad-spectrum antibiotics and blood cultures were obtained which have returned positive for MRSA.  Antibiotics have been de-escalated to vancomycin alone.  She had a chest x-ray which was unremarkable and a left shoulder x-ray which was also unremarkable.  Other lab work obtained was significant for AST 257, ALT 454, alk phos 163, T. bili 6.9.  Right upper quadrant ultrasound was obtained notable for hepatic parenchymal edema with a partially contracted gallbladder with edematous wall and sludge.  This was felt to be more likely reactive as opposed to a calculus cholecystitis.  Repeat CMP this morning shows improvement in LFTs with continued T. bili of 6.7.  A hepatitis panel was remarkable for reactive hepatitis C antibody.  HIV test was negative.  Patient also underwent CT angiography of the chest notable for bilateral nodules and rounded airspace opacities consistent with multifocal pneumonia and septic emboli.  An MRI of her left shoulder is pending to further evaluate her shoulder pain.   Past Medical History:  Diagnosis Date   Asthma     Social History   Tobacco Use   Smoking status: Every Day    Packs/day: 1.00    Types:  Cigarettes  Substance Use Topics   Alcohol use: No   Drug use: No    History reviewed. No pertinent family history.  Allergies  Allergen Reactions   Doxycycline Other (See Comments)    REACTION: Joint Pain    Review of Systems  Constitutional:  Positive for fever and malaise/fatigue.  Cardiovascular:  Positive for chest pain.  Gastrointestinal: Negative.   Genitourinary: Negative.   Musculoskeletal:  Positive for back pain and joint pain.  Skin: Negative.   Psychiatric/Behavioral:  Positive for substance abuse.   All other systems reviewed and are negative.  OBJECTIVE:   Blood pressure 103/67, pulse 92, temperature (!) 97.5 F (36.4 C), temperature source Oral, resp. rate (!) 23, height 5' (1.524 m), weight 52 kg, SpO2 98 %. Body mass index is 22.39 kg/m.  Physical Exam Constitutional:      General: She is not in acute distress.    Appearance: Normal appearance.  HENT:     Head: Normocephalic and atraumatic.     Mouth/Throat:     Comments: Dentition is poor Eyes:     Extraocular Movements: Extraocular movements intact.     Conjunctiva/sclera: Conjunctivae normal.  Cardiovascular:     Rate and Rhythm: Regular rhythm. Tachycardia present.  Pulmonary:     Effort: Pulmonary effort is normal. No respiratory distress.  Abdominal:     General: There is no distension.     Palpations: Abdomen is soft.  Musculoskeletal:     Cervical back: Normal range of motion and neck supple.     Comments: Left shoulder with decreased range of motion and increased swelling compared to the right. Midthoracic tenderness to palpation of her spine.  Skin:    General: Skin is warm and dry.     Findings: No rash.     Comments: Bilateral evidence of injection drug use in her upper extremities  Neurological:     General: No focal deficit present.     Mental Status: She is alert and oriented to person, place, and time.  Psychiatric:        Mood and Affect: Mood normal.        Behavior:  Behavior normal.     Lab Results: Lab Results  Component Value Date   WBC 13.1 (H) 02/07/2022   HGB 10.7 (L) 02/07/2022   HCT 32.9 (L) 02/07/2022   MCV 76.3 (L) 02/07/2022   PLT 126 (L) 02/07/2022    Lab Results  Component Value Date   NA 130 (L) 02/07/2022   K 3.4 (L) 02/07/2022   CO2 23 02/07/2022   GLUCOSE 103 (H) 02/07/2022   BUN 12 02/07/2022   CREATININE 0.63 02/07/2022   CALCIUM 8.2 (L) 02/07/2022   GFRNONAA >60 02/07/2022   GFRAA >60 11/30/2015    Lab Results  Component Value Date   ALT 293 (H) 02/07/2022   AST 144 (H) 02/07/2022   ALKPHOS 111 02/07/2022   BILITOT 6.7 (H) 02/07/2022    No results found for: CRP  No results found for: ESRSEDRATE  I  have reviewed the micro and lab results in [place].  Imaging: CT Angio Chest Pulmonary Embolism (PE) W or WO Contrast  Result Date: 02/07/2022 CLINICAL DATA:  Sepsis and IV drug abuse. Fever and pleuritic chest pain. Left shoulder pain. EXAM: CT ANGIOGRAPHY CHEST WITH CONTRAST TECHNIQUE: Multidetector CT imaging of the chest was performed using the standard protocol during bolus administration of intravenous contrast. Multiplanar CT image reconstructions and MIPs were obtained to evaluate the vascular anatomy. RADIATION DOSE REDUCTION: This exam was performed according to the departmental dose-optimization program which includes automated exposure control, adjustment of the mA and/or kV according to patient size and/or use of iterative reconstruction technique. CONTRAST:  23m OMNIPAQUE IOHEXOL 350 MG/ML SOLN COMPARISON:  Chest radiograph 02/06/2022 FINDINGS: Cardiovascular: No filling defect is identified in the pulmonary arterial tree to suggest pulmonary embolus. Mediastinum/Nodes: Ill-defined left supraclavicular adenopathy up to 1.1 cm in diameter with hazy indistinctness of tissue planes in the left supraclavicular region. Left lower neck level V lymph node 0.6 cm in short axis on image 2 series 5. Left level IV lymph  node 0.8 cm in short axis on image 7 series 5. Indistinctly marginated suspected thoracic adenopathy including a 1.8 cm in short axis right eccentric subcarinal node, 0.9 cm left infrahilar lymph node,, and a 0.9 cm in short axis AP window lymph node. Right hilar node 1.1 cm in short axis on image 42 series 5. Indistinct stranding in the mediastinal tissues. Lungs/Pleura: Bilateral scattered pulmonary nodules of various density along with rounded regions of bilateral airspace opacity measuring up to about 6 cm in diameter. Some of these have internal gas density such as the 2.9 by 1.9 cm left lower lobe airspace opacity on image 98 of series 6, potentially from mild early cavitation. Secondary pulmonary lobular septal thickening at the lung apices. Airway thickening is present, suggesting bronchitis or reactive airways disease. Trace left pleural effusion. Upper Abdomen: Splenomegaly. Musculoskeletal: Diffuse subcutaneous edema. Review of the MIP images confirms the above findings. IMPRESSION: 1. Scattered bilateral mixed density nodules and rounded airspace opacities scattered in both lungs, with potential mild cavitation in one of the left lower lobe airspace opacities, and with indistinct mediastinal and hilar adenopathy. Given the clinical context, the appearance favors multifocal pneumonia and potentially septic emboli. However, no filling defect is identified in the pulmonary arterial tree to suggest bland pulmonary arterial thrombus. Surveillance chest imaging recommended to ensure expected improvement with therapy. 2. Ill-defined left supraclavicular adenopathy and localized fat stranding slightly disproportionate to the diffuse subcutaneous edema. 3. Trace left pleural effusion. 4. Airway thickening is present, suggesting bronchitis or reactive airways disease. 5. Splenomegaly. Electronically Signed   By: WVan ClinesM.D.   On: 02/07/2022 11:49   DG Chest Port 1 View  Result Date:  02/06/2022 CLINICAL DATA:  Questionable sepsis. EXAM: PORTABLE CHEST 1 VIEW COMPARISON:  Chest x-ray 12/01/2015. FINDINGS: The heart size and mediastinal contours are within normal limits. Both lungs are clear. The visualized skeletal structures are unremarkable. IMPRESSION: No active disease. Electronically Signed   By: ARonney AstersM.D.   On: 02/06/2022 20:47   DG Shoulder Left Portable  Result Date: 02/06/2022 CLINICAL DATA:  Pain, fever, heroin abuse EXAM: LEFT SHOULDER COMPARISON:  None Available. FINDINGS: Frontal and lateral views of the left shoulder are obtained. There are no acute or destructive bony lesions. Joint spaces are well preserved. Soft tissues are unremarkable without subcutaneous gas or radiopaque foreign body. Visualized portions of the left chest are clear. IMPRESSION: 1.  Unremarkable left shoulder. Electronically Signed   By: Randa Ngo M.D.   On: 02/06/2022 20:43   US Abdomen Limited RUQ (LIVER/GB)  Result Date: 02/07/2022 CLINICAL DATA:  31 year old female with sepsis and abnormal LFTs. EXAM: ULTRASOUND ABDOMEN LIMITED RIGHT UPPER QUADRANT COMPARISON:  None Available. FINDINGS: Gallbladder: Partially contracted gallbladder but with thickened and edematous appearing gallbladder wall (4 mm image 4). Sludge within the neck of the gallbladder (image 5). No shadowing echogenic stones identified. No sonographic Murphy sign elicited. Common bile duct: Diameter: 4 mm, normal. Liver: Hepatic starry sky appearance (image 8) raising the possibility of hepatic parenchymal edema. No intrahepatic biliary ductal dilatation suspected. No discrete liver lesion. Portal vein is patent on color Doppler imaging with normal direction of blood flow towards the liver. Other: Negative visible right kidney. There is trace perihepatic ascites (image 52). IMPRESSION: 1. Accentuated appearance of the portal triads which can be seen in the setting of hepatic parenchymal edema. There is trace perihepatic  ascites. 2. Partially contracted gallbladder with edematous wall and sludge. Gallbladder edema reactive to #1 seems more likely than acalculus cholecystitis. 3. No evidence of biliary duct obstruction. Electronically Signed   By: Genevie Ann M.D.   On: 02/07/2022 06:50     Imaging independently reviewed in Epic.  Raynelle Highland for Infectious Disease [facility] [phone] pager 02/07/2022, 1:24 PM

## 2022-02-07 NOTE — TOC Initial Note (Signed)
Transition of Care [facility]) - Initial/Assessment Note    Patient Details  Name: Carol Rivera MRN: [id number] Date of Birth: 10/01/1990  Transition of Care [facility]) CM/SW Contact:    Golda Acre, RN Phone Number: 02/07/2022, 8:27 AM  Clinical Narrative:                  Transition of Care [facility]) Screening Note   Patient Details  Name: Carol Rivera Date of Birth: 09-04-1991   Transition of Care [facility]) CM/SW Contact:    Golda Acre, RN Phone Number: 02/07/2022, 8:27 AM    Transition of Care Department [facility]) has reviewed patient and no TOC needs have been identified at this time. We will continue to monitor patient advancement through interdisciplinary progression rounds. If new patient transition needs arise, please place a TOC consult.    Expected Discharge Plan: Home/Self Care Barriers to Discharge: No Barriers Identified   Patient Goals and CMS Choice Patient states their goals for this hospitalization and ongoing recovery are:: to go back home CMS Medicare.gov Compare Post Acute Care list provided to:: Patient Choice offered to / list presented to : Patient  Expected Discharge Plan and Services Expected Discharge Plan: Home/Self Care   Discharge Planning Services: CM Consult   Living arrangements for the past 2 months: Single Family Home                                      Prior Living Arrangements/Services Living arrangements for the past 2 months: Single Family Home Lives with:: Self Patient language and need for interpreter reviewed:: Yes Do you feel safe going back to the place where you live?: Yes            Criminal Activity/Legal Involvement Pertinent to Current Situation/Hospitalization: No - Comment as needed  Activities of Daily Living Home Assistive Devices/Equipment: None ADL Screening (condition at time of admission) Patient's cognitive ability adequate to safely complete daily activities?: No Is the patient deaf or  have difficulty hearing?: No Does the patient have difficulty seeing, even when wearing glasses/contacts?: No Does the patient have difficulty concentrating, remembering, or making decisions?: No Patient able to express need for assistance with ADLs?: No Does the patient have difficulty dressing or bathing?: No Independently performs ADLs?: Yes (appropriate for developmental age) Does the patient have difficulty walking or climbing stairs?: No Weakness of Legs: None Weakness of Arms/Hands: None  Permission Sought/Granted                  Emotional Assessment Appearance:: Appears stated age Attitude/Demeanor/Rapport: Engaged Affect (typically observed): Calm Orientation: : Oriented to Place, Oriented to  Time, Oriented to Self, Oriented to Situation Alcohol / Substance Use: Not Applicable Psych Involvement: No (comment)  Admission diagnosis:  Sepsis (HCC) [A41.9] Sepsis with acute organ dysfunction and septic shock, due to unspecified organism, unspecified type (HCC) [A41.9, R65.21] Patient Active Problem List   Diagnosis Date Noted   Hyponatremia 02/07/2022   Elevated LFTs 02/07/2022   IVDU (intravenous drug user) 02/07/2022   Left shoulder pain 02/07/2022   Chest pain, pleuritic 02/07/2022   Sepsis (HCC) 02/06/2022   ASTHMA, PERSISTENT, MILD 07/04/2010   CHRONIC PHARYNGITIS 03/06/2010   SMOKER 02/23/2010   ALLERGIC RHINITIS 01/26/2010   UPPER RESPIRATORY INFECTION, ACUTE, WITH BRONCHITIS 01/23/2010   ACUTE BRONCHOSPASM 01/23/2010   PCP:  Pcp, No Pharmacy:  No Pharmacies Listed  Social Determinants of Health (SDOH) Interventions    Readmission Risk Interventions     View : No data to display.

## 2022-02-07 NOTE — Progress Notes (Signed)
Notified provider of need to order repeat lactic acid. ° °

## 2022-02-07 NOTE — Progress Notes (Signed)
Pharmacy Antibiotic Note  Carol Rivera is a 31 y.o. female admitted on 02/06/2022 with  evaluation of fever, cough with green sputum, and left shoulder pain.  Onset of symptoms several days ago.  Exposed to a friend with a similar illness.  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  Pharmacy has been consulted for vancomycin and cefepime.  1st doses given in the ED  Plan: Vancomycin 1250mg  IV q24h (AUC 510.5, used Scr 0.8) Cefepime 2gm IV q8h Flagyl 500mg  IV q12h per MD Follow renal function, cultures and clinical course  Height: 5' (152.4 cm) Weight: 52.2 kg (115 lb) IBW/kg (Calculated) : 45.5  Temp (24hrs), Avg:101.1 F (38.4 C), Min:99.2 F (37.3 C), Max:102.7 F (39.3 C)  Recent Labs  Lab 02/06/22 1951 02/06/22 2020 02/07/22 0023 02/07/22 0340  WBC  --  17.9*  --   --   CREATININE  --  0.55  --   --   LATICACIDVEN 4.0*  --  4.4* >9.0*    Estimated Creatinine Clearance: 73.2 mL/min (by C-G formula based on SCr of 0.55 mg/dL).    Allergies  Allergen Reactions   Doxycycline Other (See Comments)    REACTION: Joint Pain    Antimicrobials this admission: Vanc 5/18 >> Cefepime 5/18 >> Flagyl 5/18 >>  Dose adjustments this admission:   Microbiology results: 5/18 BCx:  5/18 UCx:  5/18 MRSA PCR: positive  Thank you for allowing pharmacy to be a part of this patient's care.  6/18 RPh 02/07/2022, 4:33 AM
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
## 2022-02-07 NOTE — Progress Notes (Signed)
PROGRESS NOTE    Rodney BoozeJennifer L Cobey  YNW:[id number] DOB: 1991/03/05 DOA: 02/06/2022 PCP: Pcp, No    Brief Narrative:  31 year old with history of childhood asthma, IV drug use presented to the ER with fever, pleuritic chest pain, left shoulder pain for 4 days.  Last injectable heroin use before coming to the hospital.  At the emergency room temperature 39.3, on room air, tachycardic with heart rate 140.  Initial blood pressure stable.  Chest x-ray and left shoulder x-ray negative for any acute findings.  Elevated LFTs.  Lactic acid 4-9.  COVID and influenza negative.  Treated as sepsis and admitted.   Assessment & Plan:   Septic shock with endorgan damage, acute liver failure secondary to sepsis: Suspect disseminated MRSA infection in a patient with IV drug use. Received 30 mL/kg fluid bolus overnight, lactic acid was 9, 2 more liter of Ringer lactate now.  Recheck lactic acid after Ringer lactate.  Peripheral perfusion and urine output is adequate we will continue close monitoring in the stepdown unit.  Blood pressures are adequate at this time. Blood cultures were drawn and currently on vancomycin, cefepime and Flagyl.  Continue today. MRSA nasal swab was positive.  Eradication protocol. CTA of the chest and MRI of the left shoulder once patient is more stable and pain controlled to rule out disseminated abscess/septic arthritis.  Acute liver failure: Consistent with shock liver.  Continue to monitor.  IV drug use: IV heroin use.  Patient will need IV opiates today for pain relief and stabilization.  Not ready for counseling today.  Continue monitoring, avoid withdrawals. Once more stable, will discuss about drug cessation/alternative treatments.  Electrolytes: Replace potassium.  Check magnesium and phosphorus.   DVT prophylaxis: enoxaparin (LOVENOX) injection 40 mg Start: 02/07/22 1000   Code Status: Full code Family Communication: None.  Patient told to discuss with boyfriend but  not with mom yet.  She will talk to her mom herself. Disposition Plan: Status is: Inpatient Remains inpatient appropriate because: Septic shock.     Consultants:  None  Procedures:  None  Antimicrobials:  Vancomycin, cefepime and Flagyl 5/18---   Subjective: Patient seen and examined.  She was alert awake and slightly distressed with left shoulder pain but able to keep up conversation. I discussed about calling her mom and she told me not to.  She will talk to herself. Her partner also uses drugs, we need to be careful when he visits. Blood pressure very still low, last lactic acid was 9, improved with 2 L of Ringer lactate.  Objective: Vitals:   02/07/22 0830 02/07/22 0845 02/07/22 0900 02/07/22 0915  BP: (!) 85/49 (!) 87/45 (!) 95/55 100/77  Pulse: 90 89 96 (!) 103  Resp: 19 19 19 17   Temp:      TempSrc:      SpO2: 99% 99% 100% (!) 86%  Weight:      Height:        Intake/Output Summary (Last 24 hours) at 02/07/2022 1051 Last data filed at 02/07/2022 1007 Gross per 24 hour  Intake 4115.14 ml  Output --  Net 4115.14 ml   Filed Weights   02/06/22 1933 02/07/22 0649  Weight: 52.2 kg 52 kg    Examination:  General exam: Appears anxious.  In moderate distress on examination of left shoulder. Respiratory system: Clear to auscultation. Respiratory effort normal.  No added sounds.  Poor inspiratory effort. Cardiovascular system: S1 & S2 heard, tachycardic.  No pedal edema. Gastrointestinal system: Abdomen is nondistended,  soft and nontender. No organomegaly or masses felt. Normal bowel sounds heard.  No tenderness. Central nervous system: Alert and oriented. No focal neurological deficits.  Moves all extremities. Extremities: Symmetric 5 x 5 power. Skin: No rashes, lesions or ulcers Global tenderness on the left shoulder, no redness or erythema, no visible swelling.    Data Reviewed: I have personally reviewed following labs and imaging studies  CBC: Recent Labs   Lab 02/06/22 2020 02/07/22 0658  WBC 17.9* 13.1*  NEUTROABS 14.9*  --   HGB 12.2 10.7*  HCT 37.0 32.9*  MCV 76.1* 76.3*  PLT 187 126*   Basic Metabolic Panel: Recent Labs  Lab 02/06/22 2020 02/07/22 0658  NA 128* 130*  K 4.8 3.4*  CL 92* 98  CO2 24 23  GLUCOSE 118* 103*  BUN 12 12  CREATININE 0.55 0.63  CALCIUM 8.3* 8.2*   GFR: Estimated Creatinine Clearance: 73.2 mL/min (by C-G formula based on SCr of 0.63 mg/dL). Liver Function Tests: Recent Labs  Lab 02/06/22 2020 02/07/22 0658  AST 257* 144*  ALT 454* 293*  ALKPHOS 163* 111  BILITOT 6.9* 6.7*  PROT 7.8 6.0*  ALBUMIN 3.0* 2.3*   No results for input(s): LIPASE, AMYLASE in the last 168 hours. No results for input(s): AMMONIA in the last 168 hours. Coagulation Profile: Recent Labs  Lab 02/06/22 2020  INR 1.3*   Cardiac Enzymes: No results for input(s): CKTOTAL, CKMB, CKMBINDEX, TROPONINI in the last 168 hours. BNP (last 3 results) No results for input(s): PROBNP in the last 8760 hours. HbA1C: No results for input(s): HGBA1C in the last 72 hours. CBG: No results for input(s): GLUCAP in the last 168 hours. Lipid Profile: No results for input(s): CHOL, HDL, LDLCALC, TRIG, CHOLHDL, LDLDIRECT in the last 72 hours. Thyroid Function Tests: No results for input(s): TSH, T4TOTAL, FREET4, T3FREE, THYROIDAB in the last 72 hours. Anemia Panel: No results for input(s): VITAMINB12, FOLATE, FERRITIN, TIBC, IRON, RETICCTPCT in the last 72 hours. Sepsis Labs: Recent Labs  Lab 02/06/22 1951 02/07/22 0023 02/07/22 0340 02/07/22 0658  PROCALCITON  --   --   --  16.71  LATICACIDVEN 4.0* 4.4* >9.0* 2.8*    Recent Results (from the past 240 hour(s))  Blood culture (routine x 2)     Status: None (Preliminary result)   Collection Time: 02/06/22  8:15 PM   Specimen: BLOOD  Result Value Ref Range Status   Specimen Description   Final    BLOOD RIGHT ANTECUBITAL Performed at [facility], [address], [place], [place] [address]    Special Requests   Final    BOTTLES DRAWN AEROBIC ONLY Blood Culture results may not be optimal due to an inadequate volume of blood received in culture bottles Performed at [facility], [address], [place], [place] [address]    Culture   Final    NO GROWTH < 12 HOURS Performed at [facility], [address] [place], [place] [address]    Report Status PENDING  Incomplete  Blood culture (routine x 2)     Status: None (Preliminary result)   Collection Time: 02/06/22  8:20 PM   Specimen: BLOOD  Result Value Ref Range Status   Specimen Description   Final    BLOOD BLOOD RIGHT HAND Performed at [facility], [address], [place], [place] [address]    Special Requests   Final    BOTTLES DRAWN AEROBIC AND ANAEROBIC Blood Culture  results may not be optimal due to an excessive volume of blood received in culture bottles Performed at [facility], [address] [place], [place] [address]    Culture  Setup Time   Final    GRAM POSITIVE COCCI IN CLUSTERS IN BOTH AEROBIC AND ANAEROBIC BOTTLES Organism ID to follow Performed at [facility], [address], [place], [place] [address]    Culture GRAM POSITIVE COCCI  Final   Report Status PENDING  Incomplete  Resp Panel by RT-PCR (Flu A&B, Covid) Peripheral     Status: None   Collection Time: 02/06/22  8:38 PM   Specimen: Peripheral; Nasopharyngeal(NP) swabs in vial transport medium  Result Value Ref Range Status   SARS Coronavirus 2 by RT PCR NEGATIVE NEGATIVE Final    Comment: (NOTE) SARS-CoV-2 target nucleic acids are NOT DETECTED.  The SARS-CoV-2 RNA is generally detectable in upper respiratory specimens during the acute phase of infection. The lowest concentration of SARS-CoV-2 viral copies this assay can detect is 138 copies/mL. A negative result does not preclude SARS-Cov-2 infection and should not be used as the  sole basis for treatment or other patient management decisions. A negative result may occur with  improper specimen collection/handling, submission of specimen other than nasopharyngeal swab, presence of viral mutation(s) within the areas targeted by this assay, and inadequate number of viral copies(<138 copies/mL). A negative result must be combined with clinical observations, patient history, and epidemiological information. The expected result is Negative.  Fact Sheet for Patients:  BloggerCourse.com  Fact Sheet for Healthcare Providers:  SeriousBroker.it  This test is no t yet approved or cleared by the [place] FDA and  has been authorized for detection and/or diagnosis of SARS-CoV-2 by FDA under an Emergency Use Authorization (EUA). This EUA will remain  in effect (meaning this test can be used) for the duration of the COVID-19 declaration under Section 564(b)(1) of the Act, 21 U.S.C.section 360bbb-3(b)(1), unless the authorization is terminated  or revoked sooner.       Influenza A by PCR NEGATIVE NEGATIVE Final   Influenza B by PCR NEGATIVE NEGATIVE Final    Comment: (NOTE) The Xpert Xpress SARS-CoV-2/FLU/RSV plus assay is intended as an aid in the diagnosis of influenza from Nasopharyngeal swab specimens and should not be used as a sole basis for treatment. Nasal washings and aspirates are unacceptable for Xpert Xpress SARS-CoV-2/FLU/RSV testing.  Fact Sheet for Patients: BloggerCourse.com  Fact Sheet for Healthcare Providers: SeriousBroker.it  This test is not yet approved or cleared by the [place] FDA and has been authorized for detection and/or diagnosis of SARS-CoV-2 by FDA under an Emergency Use Authorization (EUA). This EUA will remain in effect (meaning this test can be used) for the duration of the COVID-19 declaration under Section 564(b)(1) of the  Act, 21 U.S.C. section 360bbb-3(b)(1), unless the authorization is terminated or revoked.  Performed at [facility], [address], [place], [place] [address]   MRSA Next Gen by PCR, Nasal     Status: Abnormal   Collection Time: 02/07/22 12:43 AM   Specimen: Nasal Mucosa; Nasal Swab  Result Value Ref Range Status   MRSA by PCR Next Gen DETECTED (A) NOT DETECTED Final    Comment: CRITICAL RESULT CALLED TO, READ BACK BY AND VERIFIED WITH: CALLED TO ZAHOUREK,A AT 0240 ON 02/07/22 BY VAZQUEZ,J (NOTE) The GeneXpert MRSA Assay (FDA approved for NASAL specimens only), is one component of a comprehensive MRSA colonization surveillance program.  It is not intended to diagnose MRSA infection nor to guide or monitor treatment for MRSA infections. Test performance is not FDA approved in patients less than 61 years old. Performed at [facility], [address], [place], [place] [address]   Culture, blood (single) w Reflex to ID Panel     Status: None (Preliminary result)   Collection Time: 02/07/22  6:58 AM   Specimen: BLOOD  Result Value Ref Range Status   Specimen Description   Final    BLOOD BLOOD LEFT WRIST Performed at [facility], [address] [place], [place] [address]    Special Requests   Final    IN PEDIATRIC BOTTLE Blood Culture adequate volume Performed at [facility], [address], [place], [place] [address]    Culture   Final    NO GROWTH < 12 HOURS Performed at [facility], [address], [place], [place] [address]    Report Status PENDING  Incomplete         Radiology Studies: DG Chest Port 1 View  Result Date: 02/06/2022 CLINICAL DATA:  Questionable sepsis. EXAM: PORTABLE CHEST 1 VIEW COMPARISON:  Chest x-ray 12/01/2015. FINDINGS: The heart size and mediastinal contours are within normal limits. Both lungs are clear. The visualized skeletal structures are unremarkable.  IMPRESSION: No active disease. Electronically Signed   By: Darliss Cheney M.D.   On: 02/06/2022 20:47   DG Shoulder Left Portable  Result Date: 02/06/2022 CLINICAL DATA:  Pain, fever, heroin abuse EXAM: LEFT SHOULDER COMPARISON:  None Available. FINDINGS: Frontal and lateral views of the left shoulder are obtained. There are no acute or destructive bony lesions. Joint spaces are well preserved. Soft tissues are unremarkable without subcutaneous gas or radiopaque foreign body. Visualized portions of the left chest are clear. IMPRESSION: 1. Unremarkable left shoulder. Electronically Signed   By: Sharlet Salina M.D.   On: 02/06/2022 20:43   US Abdomen Limited RUQ (LIVER/GB)  Result Date: 02/07/2022 CLINICAL DATA:  31 year old female with sepsis and abnormal LFTs. EXAM: ULTRASOUND ABDOMEN LIMITED RIGHT UPPER QUADRANT COMPARISON:  None Available. FINDINGS: Gallbladder: Partially contracted gallbladder but with thickened and edematous appearing gallbladder wall (4 mm image 4). Sludge within the neck of the gallbladder (image 5). No shadowing echogenic stones identified. No sonographic Murphy sign elicited. Common bile duct: Diameter: 4 mm, normal. Liver: Hepatic starry sky appearance (image 8) raising the possibility of hepatic parenchymal edema. No intrahepatic biliary ductal dilatation suspected. No discrete liver lesion. Portal vein is patent on color Doppler imaging with normal direction of blood flow towards the liver. Other: Negative visible right kidney. There is trace perihepatic ascites (image 52). IMPRESSION: 1. Accentuated appearance of the portal triads which can be seen in the setting of hepatic parenchymal edema. There is trace perihepatic ascites. 2. Partially contracted gallbladder with edematous wall and sludge. Gallbladder edema reactive to #1 seems more likely than acalculus cholecystitis. 3. No evidence of biliary duct obstruction. Electronically Signed   By: Odessa Fleming M.D.   On: 02/07/2022 06:50         Scheduled Meds:  Chlorhexidine Gluconate Cloth  6 each Topical Daily   enoxaparin (LOVENOX) injection  40 mg Subcutaneous Q24H   mouth rinse  15 mL Mouth Rinse BID   mupirocin ointment   Nasal BID   sodium chloride flush  3 mL Intravenous Q12H   Continuous Infusions:  ceFEPime (MAXIPIME) IV Stopped (02/07/22 0541)   lactated ringers  lactated ringers 150 mL/hr at 02/07/22 1000   metronidazole 500 mg (02/07/22 1007)   vancomycin       LOS: 1 day    Time spent: 50 minutes    Dorcas Carrow, MD [facility] Pager [phone]

## 2022-02-07 NOTE — Progress Notes (Signed)
  Echocardiogram 2D Echocardiogram has been performed.  Carol Rivera 02/07/2022, 3:32 PM

## 2022-02-07 NOTE — Progress Notes (Signed)
PHARMACY - PHYSICIAN COMMUNICATION CRITICAL VALUE ALERT - BLOOD CULTURE IDENTIFICATION (BCID)  Carol Rivera is an 31 y.o. female who presented to [facility] on 02/06/2022 with a chief complaint of fever, cough, shoulder pain.   Assessment:  4/4 bottles (excessive blood volume in one set, inadequate blood volume in one set): GPC.  BCID shows MRSA.  Pediatric BCx bottle ngtd.  Name of physician (or Provider) Contacted: Dr Jerral Ralph  Current antibiotics: Vancomycin, Cefepime, Flagyl   Changes to prescribed antibiotics recommended:  Recommendations accepted by provider Narrow to vancomycin alone.  Results for orders placed or performed during the hospital encounter of 02/06/22  Blood Culture ID Panel (Reflexed) (Collected: 02/06/2022  8:20 PM)  Result Value Ref Range   Enterococcus faecalis NOT DETECTED NOT DETECTED   Enterococcus Faecium NOT DETECTED NOT DETECTED   Listeria monocytogenes NOT DETECTED NOT DETECTED   Staphylococcus species DETECTED (A) NOT DETECTED   Staphylococcus aureus (BCID) DETECTED (A) NOT DETECTED   Staphylococcus epidermidis NOT DETECTED NOT DETECTED   Staphylococcus lugdunensis NOT DETECTED NOT DETECTED   Streptococcus species NOT DETECTED NOT DETECTED   Streptococcus agalactiae NOT DETECTED NOT DETECTED   Streptococcus pneumoniae NOT DETECTED NOT DETECTED   Streptococcus pyogenes NOT DETECTED NOT DETECTED   A.calcoaceticus-baumannii NOT DETECTED NOT DETECTED   Bacteroides fragilis NOT DETECTED NOT DETECTED   Enterobacterales NOT DETECTED NOT DETECTED   Enterobacter cloacae complex NOT DETECTED NOT DETECTED   Escherichia coli NOT DETECTED NOT DETECTED   Klebsiella aerogenes NOT DETECTED NOT DETECTED   Klebsiella oxytoca NOT DETECTED NOT DETECTED   Klebsiella pneumoniae NOT DETECTED NOT DETECTED   Proteus species NOT DETECTED NOT DETECTED   Salmonella species NOT DETECTED NOT DETECTED   Serratia marcescens NOT DETECTED NOT DETECTED   Haemophilus  influenzae NOT DETECTED NOT DETECTED   Neisseria meningitidis NOT DETECTED NOT DETECTED   Pseudomonas aeruginosa NOT DETECTED NOT DETECTED   Stenotrophomonas maltophilia NOT DETECTED NOT DETECTED   Candida albicans NOT DETECTED NOT DETECTED   Candida auris NOT DETECTED NOT DETECTED   Candida glabrata NOT DETECTED NOT DETECTED   Candida krusei NOT DETECTED NOT DETECTED   Candida parapsilosis NOT DETECTED NOT DETECTED   Candida tropicalis NOT DETECTED NOT DETECTED   Cryptococcus neoformans/gattii NOT DETECTED NOT DETECTED   Meth resistant mecA/C and MREJ DETECTED (A) NOT DETECTED    Lynann Beaver PharmD, BCPS Clinical Pharmacist [facility] main pharmacy [phone] 02/07/2022 12:22 PM

## 2022-02-07 NOTE — Progress Notes (Signed)
Patient transported to MRI on monitor with radiology without complication. Pain medication given prior to transport. About 90 minutes into MRI patient became increasingly agitated saying she "cant breathe" and complaining of pain. Patient unable to lay still for imaging and MRI stopped at that point. Patient transported back to unit with no issues. Central tele and telesitter notified of return. RN will continue to monitor.

## 2022-02-08 ENCOUNTER — Inpatient Hospital Stay (HOSPITAL_COMMUNITY): Payer: Medicaid Other

## 2022-02-08 DIAGNOSIS — I2690 Septic pulmonary embolism without acute cor pulmonale: Secondary | ICD-10-CM

## 2022-02-08 DIAGNOSIS — M25512 Pain in left shoulder: Secondary | ICD-10-CM | POA: Diagnosis not present

## 2022-02-08 DIAGNOSIS — R7881 Bacteremia: Secondary | ICD-10-CM

## 2022-02-08 DIAGNOSIS — I079 Rheumatic tricuspid valve disease, unspecified: Secondary | ICD-10-CM | POA: Diagnosis not present

## 2022-02-08 DIAGNOSIS — R7989 Other specified abnormal findings of blood chemistry: Secondary | ICD-10-CM | POA: Diagnosis not present

## 2022-02-08 DIAGNOSIS — B9562 Methicillin resistant Staphylococcus aureus infection as the cause of diseases classified elsewhere: Secondary | ICD-10-CM

## 2022-02-08 DIAGNOSIS — E871 Hypo-osmolality and hyponatremia: Secondary | ICD-10-CM | POA: Diagnosis not present

## 2022-02-08 DIAGNOSIS — A419 Sepsis, unspecified organism: Secondary | ICD-10-CM | POA: Diagnosis not present

## 2022-02-08 LAB — CBC
HCT: 36.5 % (ref 36.0–46.0)
Hemoglobin: 12.4 g/dL (ref 12.0–15.0)
MCH: 25.4 pg — ABNORMAL LOW (ref 26.0–34.0)
MCHC: 34 g/dL (ref 30.0–36.0)
MCV: 74.8 fL — ABNORMAL LOW (ref 80.0–100.0)
Platelets: 126 10*3/uL — ABNORMAL LOW (ref 150–400)
RBC: 4.88 MIL/uL (ref 3.87–5.11)
RDW: 22.4 % — ABNORMAL HIGH (ref 11.5–15.5)
WBC: 17 10*3/uL — ABNORMAL HIGH (ref 4.0–10.5)
nRBC: 0 % (ref 0.0–0.2)

## 2022-02-08 LAB — RPR: RPR Ser Ql: NONREACTIVE

## 2022-02-08 LAB — BLOOD GAS, ARTERIAL
Acid-Base Excess: 6.4 mmol/L — ABNORMAL HIGH (ref 0.0–2.0)
Allens test (pass/fail): POSITIVE — AB
Bicarbonate: 29.2 mmol/L — ABNORMAL HIGH (ref 20.0–28.0)
O2 Saturation: 99.4 %
Patient temperature: 38.9
pCO2 arterial: 38 mmHg (ref 32–48)
pH, Arterial: 7.5 — ABNORMAL HIGH (ref 7.35–7.45)
pO2, Arterial: 104 mmHg (ref 83–108)

## 2022-02-08 LAB — COMPREHENSIVE METABOLIC PANEL
ALT: 213 U/L — ABNORMAL HIGH (ref 0–44)
AST: 112 U/L — ABNORMAL HIGH (ref 15–41)
Albumin: 2 g/dL — ABNORMAL LOW (ref 3.5–5.0)
Alkaline Phosphatase: 109 U/L (ref 38–126)
Anion gap: 7 (ref 5–15)
BUN: 10 mg/dL (ref 6–20)
CO2: 26 mmol/L (ref 22–32)
Calcium: 8 mg/dL — ABNORMAL LOW (ref 8.9–10.3)
Chloride: 101 mmol/L (ref 98–111)
Creatinine, Ser: 0.53 mg/dL (ref 0.44–1.00)
GFR, Estimated: >60 mL/min (ref >60)
Glucose, Bld: 94 mg/dL (ref 70–99)
Potassium: 4.1 mmol/L (ref 3.5–5.1)
Sodium: 134 mmol/L — ABNORMAL LOW (ref 135–145)
Total Bilirubin: 5.4 mg/dL — ABNORMAL HIGH (ref 0.3–1.2)
Total Protein: 5.7 g/dL — ABNORMAL LOW (ref 6.5–8.1)

## 2022-02-08 LAB — PROCALCITONIN: Procalcitonin: 14.88 ng/mL

## 2022-02-08 LAB — PHOSPHORUS: Phosphorus: 2.4 mg/dL — ABNORMAL LOW (ref 2.5–4.6)

## 2022-02-08 LAB — MAGNESIUM: Magnesium: 1.4 mg/dL — ABNORMAL LOW (ref 1.7–2.4)

## 2022-02-08 IMAGING — DX DG CHEST 1V PORT
1 series · 1 of 1 positions shown · non-contrast
Comparison: [DATE] CT and prior studies

CLINICAL DATA: Shortness of breath and fever.

EXAM:
PORTABLE CHEST 1 VIEW

[chest ap]
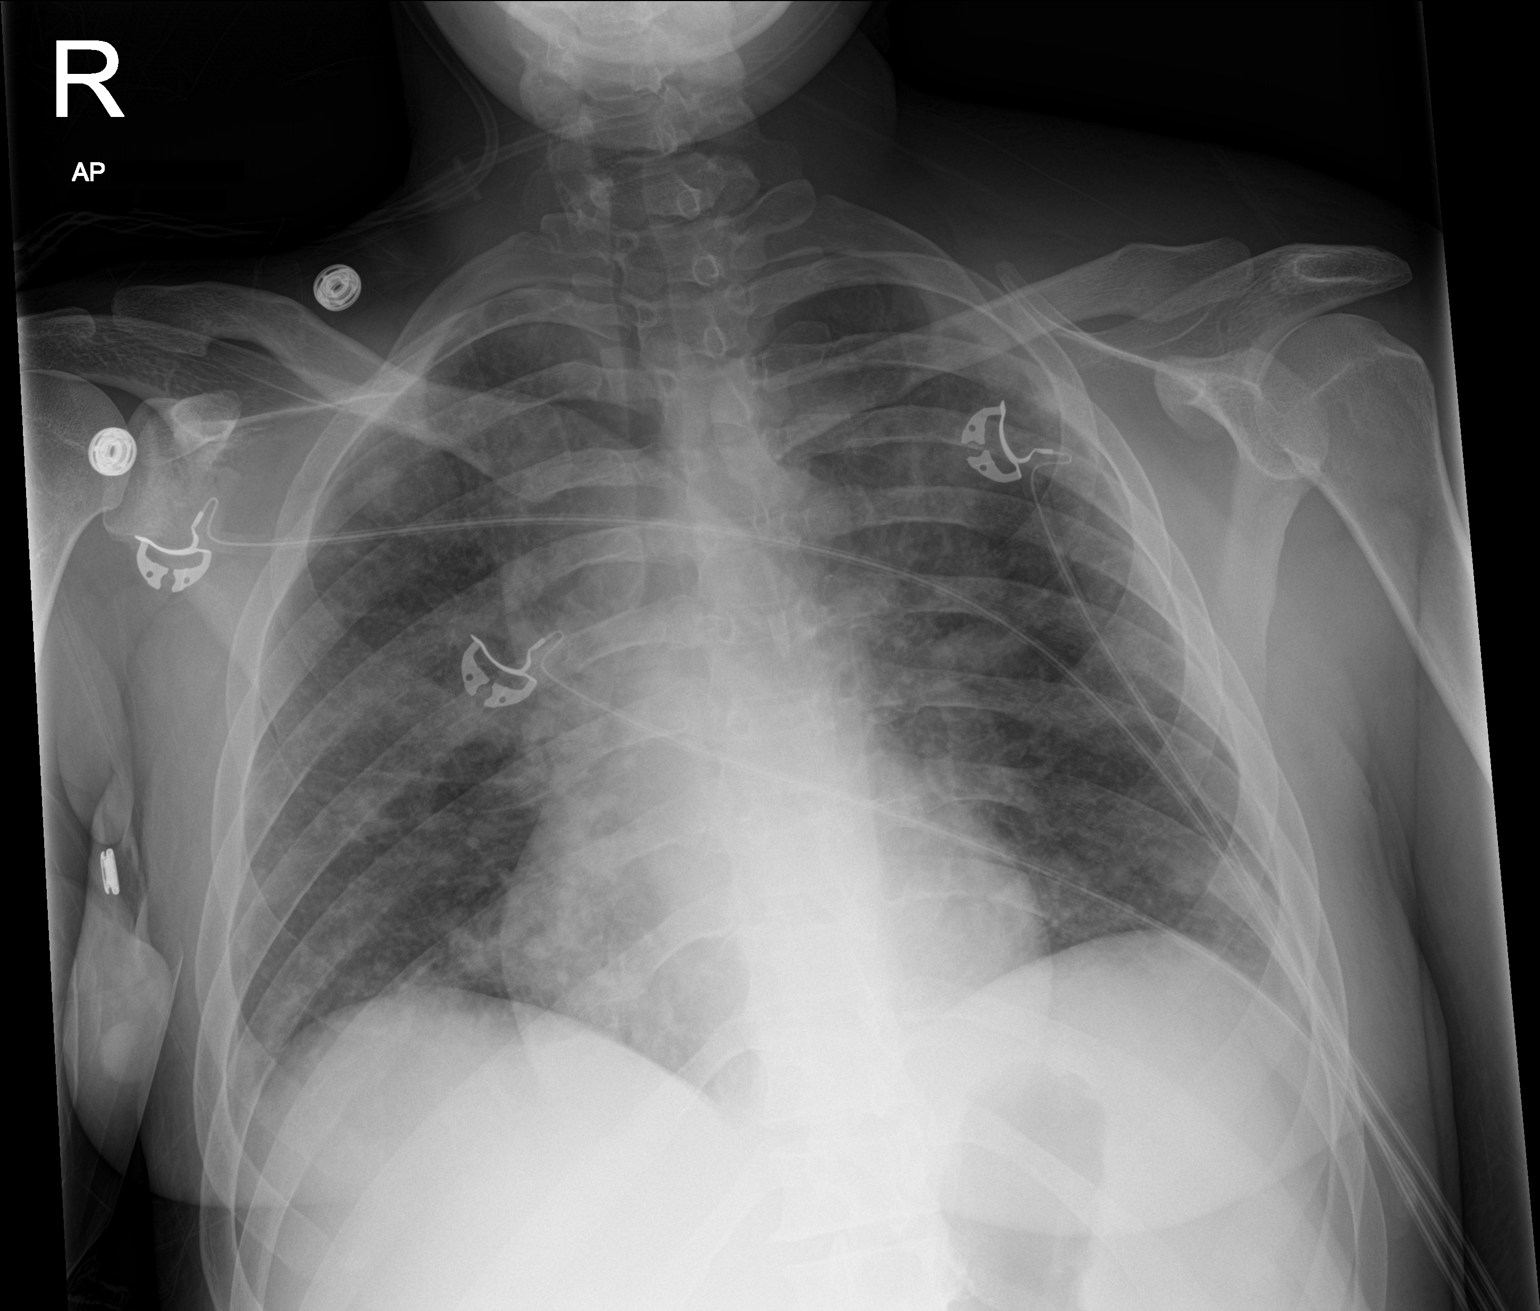

[1 of 1 positions shown; findings below may reference images not displayed]

FINDINGS: Patchy ground-glass/airspace opacities are again noted likely
representing infection/septic emboli as previously described.

No definite new pulmonary opacities are present.

No pleural effusion or pneumothorax.

Cardiomediastinal silhouette is unchanged.

No acute bony abnormalities are noted.
IMPRESSION: Patchy ground-glass/airspace opacities again noted likely
representing infection/septic emboli.

## 2022-02-08 MED ORDER — HYDROMORPHONE HCL 1 MG/ML IJ SOLN
1.0000 mg | INTRAMUSCULAR | Status: DC | PRN
  Administered 2022-02-08 – 2022-02-10 (×8): 1 mg via INTRAVENOUS
  Filled 2022-02-08 (×9): qty 1

## 2022-02-08 MED ORDER — MAGNESIUM SULFATE 2 GM/50ML IV SOLN
2.0000 g | Freq: Once | INTRAVENOUS | Status: AC
  Administered 2022-02-08: 2 g via INTRAVENOUS
  Filled 2022-02-08: qty 50

## 2022-02-08 MED ORDER — OXYCODONE HCL 5 MG PO TABS
10.0000 mg | ORAL_TABLET | ORAL | Status: DC | PRN
Start: 2022-02-08 — End: 2022-02-09
  Administered 2022-02-08 – 2022-02-09 (×3): 10 mg via ORAL
  Filled 2022-02-08 (×5): qty 2

## 2022-02-08 MED ORDER — HYDROMORPHONE HCL 1 MG/ML IJ SOLN
1.0000 mg | Freq: Once | INTRAMUSCULAR | Status: AC
  Administered 2022-02-08: 1 mg via INTRAVENOUS
  Filled 2022-02-08: qty 1

## 2022-02-08 MED ORDER — SODIUM CHLORIDE 0.9 % IV SOLN: INTRAVENOUS | Status: DC

## 2022-02-08 NOTE — Consult Note (Signed)
NAME:  Carol Rivera, MRN:  [id number], DOB:  1991-03-12, LOS: 2 ADMISSION DATE:  02/06/2022, CONSULTATION DATE:  02/08/22 REFERRING MD:  Aline August, MD CHIEF COMPLAINT:  Endocarditis   History of Present Illness:  Carol Rivera is a 31 year old woman with history of IV drug use who was admitted 02/06/22 with fever, left shoulder pain and general malaise for a few days prior to admission. 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 She has septic pulmonary emboli noted on CT Chest scan 02/07/22. She has MRI of left shoulder pending and MRI Spine without obvious abscess/phlegmon.   PCCM consulted today for increasing respiratory rate and altered mentation.   Patient sitting up in bed taking oral medications at time of evaluation. She complains of diffuse chest and back pain along with left shoulder pain.   Pertinent  Medical History  Asthma IV drug use   Significant Hospital Events: Including procedures, antibiotic start and stop dates in addition to other pertinent events   5/18 admitted to hospital for MRSA endocarditis/bacteremia 5/20 PCCM consulted for evaluation  Interim History / Subjective:  As above  Objective   Blood pressure (!) 132/96, pulse (!) 131, temperature 98.8 F (37.1 C), temperature source Oral, resp. rate (!) 40, height 5' (1.524 m), weight 52 kg, SpO2 91 %.        Intake/Output Summary (Last 24 hours) at 02/08/2022 1408 Last data filed at 02/08/2022 1121 Gross per 24 hour  Intake 298.58 ml  Output 950 ml  Net -651.42 ml   Filed Weights   02/06/22 1933 02/07/22 0649  Weight: 52.2 kg 52 kg   Examination: General: young woman, moderate distress, acutely ill appearing HENT: [place]/AT, moist mucous membranes Lungs: course breath sounds, shallow breathing Cardiovascular: tachycardic Abdomen: soft, non-tender, non-distended, BS+ Extremities: warm, no edema Neuro: alert and oriented,  intermittent drowsiness GU: n/a  Resolved Hospital Problem list     Assessment & Plan:  Sepsis due to MRSA Endocarditis/Bacteremia 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 Lactic Acidosis - ID following, continue vancomycin  - f/u repeat blood cultures from today - f/u MRI of left shoulder - Planning for TEE per cardiology  Tachypnea Septic Pulmonary Emboli Hemoptysis - Rapid shallow breaths due to pain/discomfort from septic pulmonary emboli - Add oxycodone 10mg  PRN for pain along with dilaudid IV PRN - Hemoptysis in setting of septic pulmonary emboli - Will continue to watch her closely as she is high risk for decompensating and requiring intubation  Mild Hyponatremia - improving, continue to monitor  Elevated Liver Enzymes Hyperbilirubinemia - in setting of sepsis/bacteremia - continue to monitor - RUQ [place] not concerning for cholecystitis  Hypomagnesemia Hypophosphatemia - replete as needed - monitor  Best Practice (right click and "Reselect all SmartList Selections" daily)   Diet/type: Regular consistency (see orders) DVT prophylaxis: not indicated GI prophylaxis: N/A Lines: N/A Foley:  N/A Code Status:  full code Last date of multidisciplinary goals of care discussion [discussed with patient at bedside 5/20]  Labs   CBC: Recent Labs  Lab 02/06/22 2020 02/07/22 0658 02/08/22 0304  WBC 17.9* 13.1* 17.0*  NEUTROABS 14.9*  --   --   HGB 12.2 10.7* 12.4  HCT 37.0 32.9* 36.5  MCV 76.1* 76.3* 74.8*  PLT 187 126* 126*    Basic Metabolic Panel: Recent Labs  Lab 02/06/22 2020 02/07/22 0658 02/08/22 0304  NA 128* 130* 134*  K 4.8 3.4* 4.1  CL 92* 98 101  CO2 24 23 26   GLUCOSE 118* 103* 94  BUN 12 12 10   CREATININE 0.55 0.63 0.53  CALCIUM 8.3* 8.2* 8.0*  MG  --   --  1.4*  PHOS  --   --  2.4*   GFR: Estimated Creatinine Clearance: 73.2 mL/min (by C-G formula based on SCr of 0.53 mg/dL). Recent Labs  Lab 02/06/22 2020 02/07/22 0023  02/07/22 0340 02/07/22 0658 02/07/22 1429 02/07/22 1722 02/08/22 0304 02/08/22 0848  PROCALCITON  --   --   --  16.71  --   --   --  14.88  WBC 17.9*  --   --  13.1*  --   --  17.0*  --   LATICACIDVEN  --    < > >9.0* 2.8* 2.9* 2.9*  --   --    < > = values in this interval not displayed.    Liver Function Tests: Recent Labs  Lab 02/06/22 2020 02/07/22 0658 02/08/22 0304  AST 257* 144* 112*  ALT 454* 293* 213*  ALKPHOS 163* 111 109  BILITOT 6.9* 6.7* 5.4*  PROT 7.8 6.0* 5.7*  ALBUMIN 3.0* 2.3* 2.0*   No results for input(s): LIPASE, AMYLASE in the last 168 hours. No results for input(s): AMMONIA in the last 168 hours.  ABG No results found for: PHART, PCO2ART, PO2ART, HCO3, TCO2, ACIDBASEDEF, O2SAT   Coagulation Profile: Recent Labs  Lab 02/06/22 2020  INR 1.3*    Cardiac Enzymes: No results for input(s): CKTOTAL, CKMB, CKMBINDEX, TROPONINI in the last 168 hours.  HbA1C: No results found for: HGBA1C  CBG: No results for input(s): GLUCAP in the last 168 hours.  Review of Systems:   Review of Systems  Constitutional:  Positive for fever and malaise/fatigue. Negative for chills and weight loss.  HENT:  Negative for congestion, sinus pain and sore throat.   Eyes: Negative.   Respiratory:  Positive for cough, hemoptysis, sputum production and shortness of breath. Negative for wheezing.   Cardiovascular:  Positive for chest pain. Negative for palpitations, orthopnea, claudication and leg swelling.  Gastrointestinal:  Negative for abdominal pain, heartburn, nausea and vomiting.  Genitourinary: Negative.   Musculoskeletal:  Negative for joint pain and myalgias.  Skin:  Negative for rash.  Neurological:  Negative for weakness.  Endo/Heme/Allergies: Negative.   Psychiatric/Behavioral: Negative.      Past Medical History:  She,  has a past medical history of Asthma.   Surgical History:  History reviewed. No pertinent surgical history.   Social History:    reports that she has been smoking. She has been smoking an average of 1 pack per day. She does not have any smokeless tobacco history on file. She reports that she does not drink alcohol and does not use drugs.   Family History:  Her family history is not on file.   Allergies Allergies  Allergen Reactions   Doxycycline Other (See Comments)    REACTION: Joint Pain     Home Medications  Prior to Admission medications   Medication Sig Start Date End Date Taking? Authorizing Provider  Multiple Vitamin (MULTIVITAMIN WITH MINERALS) TABS tablet Take 1 tablet by mouth daily.   Yes [provider]  naproxen sodium (ALEVE) 220 MG tablet Take 220 mg by mouth 2 (two) times daily as needed (pain/headache).   Yes [provider]     Critical care time: n/a    Freda Jackson, MD [place] Office: [phone]  See Amion for personal pager PCCM on call pager [phone] until 7pm. Please call Elink 7p-7a. [phone]

## 2022-02-08 NOTE — Progress Notes (Addendum)
PROGRESS NOTE    DONETTE MAINWARING  ZOX:[id number] DOB: 11/09/1990 DOA: 02/06/2022 PCP: Pcp, No   Brief Narrative:  31 y.o. female with medical history significant for childhood asthma and intravenous drug abuse presented with fever, chest pain, cough and left shoulder pain.  On presentation, she was febrile, tachycardic.  Chest x-ray and left shoulder x-ray was negative for acute findings.  Sodium 128, alkaline phosphatase 163, AST 257, ALT 454, total bilirubin of 6.9 with WBCs of 17,900 and lactic acid of 4.  COVID-19 and influenza testing negative.  She was started on broad-spectrum antibiotics.  She was subsequently found to have MRSA bacteremia.  ID was consulted.  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 -ID following.  MRI of spine without obvious abscess/phlegmon or osseous involvement.  Pending MRI left shoulder.  ID recommends TEE and patient might need CT surgery evaluation as well.  I have spoken to Dr. Schumann/cardiology on phone who recommended that patient be transferred to [facility] for possible TEE on Monday and depending on the findings, CT surgery involvement.  Transfer patient to [facility]. -Continue vancomycin IV.  Blood cultures from 02/06/2022 and 02/07/2022 are positive for MRSA.  Repeat blood cultures from 02/08/2022 are pending.  Acute metabolic encephalopathy -Possibly from above.  If mentation does not improve, might need MRI of brain  Elevated LFTs/acute liver failure -Possibly from above.  LFTs improving.  Monitor for  Thrombocytopenia Plan no signs of bleeding.   Monitor  Hyponatremia -Improving.  Monitor  Hypokalemia -resolved  Asthma -Currently stable  IV drug abuse -Will need counseling once more awake.   DVT prophylaxis: Lovenox Code Status: Full Family Communication: None at bedside Disposition Plan: Status is: Inpatient Remains inpatient appropriate because: Of severity of illness    Consultants: ID.  Spoke to Dr. Schumann/cardiology on phone  Procedures: 2D echo  Antimicrobials:  Anti-infectives (From admission, onward)    Start     Dose/Rate Route Frequency Ordered Stop   02/07/22 2200  Vancomycin (VANCOCIN) 1,250 mg in sodium chloride 0.9 % 250 mL IVPB  Status:  Discontinued        1,250 mg 166.7 mL/hr over 90 Minutes Intravenous Every 24 hours 02/07/22 0434 02/07/22 1645   02/07/22 2200  vancomycin (VANCOREADY) IVPB 1250 mg/250 mL        1,250 mg 166.7 mL/hr over 90 Minutes Intravenous Every 24 hours 02/07/22 1645     02/07/22 1000  metroNIDAZOLE (FLAGYL) IVPB 500 mg  Status:  Discontinued        500 mg 100 mL/hr over 60 Minutes Intravenous Every 12 hours 02/07/22 0427 02/07/22 1231   02/07/22 0600  ceFEPIme (MAXIPIME) 2 g in sodium chloride 0.9 % 100 mL IVPB  Status:  Discontinued        2 g 200 mL/hr over 30 Minutes Intravenous Every 8 hours 02/07/22 0434 02/07/22 1231   02/06/22 2130  metroNIDAZOLE (FLAGYL) IVPB 500 mg        500 mg 100 mL/hr over 60 Minutes Intravenous  Once 02/06/22 2121 02/06/22 2254   02/06/22 2130  ceFEPIme (MAXIPIME) 2 g in sodium chloride 0.9 % 100 mL IVPB        2 g 200 mL/hr over 30  Minutes Intravenous  Once 02/06/22 2126 02/06/22 2223   02/06/22 2130  vancomycin (VANCOCIN) IVPB 1000 mg/200 mL premix        1,000 mg 200 mL/hr over 60 Minutes Intravenous  Once 02/06/22 2126 02/06/22 2254        Subjective: Patient seen and examined at bedside.  Sleepy, wakes up slightly, does not answer much questions.  No overnight fever, vomiting, seizures reported.  Objective: Vitals:    02/08/22 0800 02/08/22 0900 02/08/22 1000 02/08/22 1100  BP: (!) 86/59 119/87 131/82 110/61  Pulse: (!) 133 (!) 131 (!) 118 (!) 131  Resp: 20 (!) 40 (!) 37 (!) 40  Temp: 98.5 F (36.9 C)     TempSrc: Oral     SpO2: 99% 99% 100% 91%  Weight:      Height:        Intake/Output Summary (Last 24 hours) at 02/08/2022 1152 Last data filed at 02/08/2022 1121 Gross per 24 hour  Intake 361.08 ml  Output 950 ml  Net -588.92 ml   Filed Weights   02/06/22 1933 02/07/22 0649  Weight: 52.2 kg 52 kg    Examination:  General exam: Appears calm and comfortable.  Looks chronically ill and deconditioned.  Currently on room air. Respiratory system: Bilateral decreased breath sounds at bases with some scattered crackles.  Tachypneic Cardiovascular system: S1 & S2 heard, tachycardic  gastrointestinal system: Abdomen is nondistended, soft and nontender. Normal bowel sounds heard. Extremities: No cyanosis, clubbing; trace lower extremity edema present Central nervous system: Wakes up slightly, extremely slow to respond.  No focal neurological deficits. Moving extremities Skin: No rashes, lesions or ulcers Psychiatry: Hardly participates in any conversation.  Affect is flat.    Data Reviewed: I have personally reviewed following labs and imaging studies  CBC: Recent Labs  Lab 02/06/22 2020 02/07/22 0658 02/08/22 0304  WBC 17.9* 13.1* 17.0*  NEUTROABS 14.9*  --   --   HGB 12.2 10.7* 12.4  HCT 37.0 32.9* 36.5  MCV 76.1* 76.3* 74.8*  PLT 187 126* 126*   Basic Metabolic Panel: Recent Labs  Lab 02/06/22 2020 02/07/22 0658 02/08/22 0304  NA 128* 130* 134*  K 4.8 3.4* 4.1  CL 92* 98 101  CO2 24 23 26   GLUCOSE 118* 103* 94  BUN 12 12 10   CREATININE 0.55 0.63 0.53  CALCIUM 8.3* 8.2* 8.0*  MG  --   --  1.4*  PHOS  --   --  2.4*   GFR: Estimated Creatinine Clearance: 73.2 mL/min (by C-G formula based on SCr of 0.53 mg/dL). Liver Function Tests: Recent Labs  Lab 02/06/22 2020  02/07/22 0658 02/08/22 0304  AST 257* 144* 112*  ALT 454* 293* 213*  ALKPHOS 163* 111 109  BILITOT 6.9* 6.7* 5.4*  PROT 7.8 6.0* 5.7*  ALBUMIN 3.0* 2.3* 2.0*   No results for input(s): LIPASE, AMYLASE in the last 168 hours. No results for input(s): AMMONIA in the last 168 hours. Coagulation Profile: Recent Labs  Lab 02/06/22 2020  INR 1.3*   Cardiac Enzymes: No results for input(s): CKTOTAL, CKMB, CKMBINDEX, TROPONINI in the last 168 hours. BNP (last 3 results) No results for input(s): PROBNP in the last 8760 hours. HbA1C: No results for input(s): HGBA1C in the last 72 hours. CBG: No results for input(s): GLUCAP in the last 168 hours. Lipid Profile: No results for input(s): CHOL, HDL, LDLCALC, TRIG, CHOLHDL, LDLDIRECT in the last 72 hours. Thyroid Function Tests: No results for input(s): TSH, T4TOTAL, FREET4,  T3FREE, THYROIDAB in the last 72 hours. Anemia Panel: No results for input(s): VITAMINB12, FOLATE, FERRITIN, TIBC, IRON, RETICCTPCT in the last 72 hours. Sepsis Labs: Recent Labs  Lab 02/07/22 0340 02/07/22 0658 02/07/22 1429 02/07/22 1722 02/08/22 0848  PROCALCITON  --  16.71  --   --  14.88  LATICACIDVEN >9.0* 2.8* 2.9* 2.9*  --     Recent Results (from the past 240 hour(s))  Blood culture (routine x 2)     Status: Abnormal (Preliminary result)   Collection Time: 02/06/22  8:15 PM   Specimen: BLOOD  Result Value Ref Range Status   Specimen Description   Final    BLOOD RIGHT ANTECUBITAL Performed at [facility], [address] [place], [place] [address]    Special Requests   Final    BOTTLES DRAWN AEROBIC ONLY Blood Culture results may not be optimal due to an inadequate volume of blood received in culture bottles Performed at [facility], [address], [place], [place] [address]    Culture  Setup Time   Final    GRAM POSITIVE COCCI IN CLUSTERS AEROBIC BOTTLE ONLY Performed at [facility], [address], [place], [place] [address]    Culture STAPHYLOCOCCUS AUREUS (A)  Final   Report Status PENDING  Incomplete  Blood culture (routine x 2)     Status: Abnormal (Preliminary result)   Collection Time: 02/06/22  8:20 PM   Specimen: BLOOD  Result Value Ref Range Status   Specimen Description   Final    BLOOD BLOOD RIGHT HAND Performed at [facility], [address], [place], [place] [address]    Special Requests   Final    BOTTLES DRAWN AEROBIC AND ANAEROBIC Blood Culture results may not be optimal due to an excessive volume of blood received in culture bottles Performed at [facility], [address] [place], [place] [address]    Culture  Setup Time   Final    GRAM POSITIVE COCCI IN CLUSTERS IN BOTH AEROBIC AND ANAEROBIC BOTTLES CRITICAL RESULT CALLED TO, READ BACK BY AND VERIFIED WITH: PHARMD M BELL [id number] AT 1205 BY CM    Culture (A)  Final    STAPHYLOCOCCUS AUREUS SUSCEPTIBILITIES TO FOLLOW Performed at [facility], [address] [place], [place] [address]    Report Status PENDING  Incomplete  Blood Culture ID Panel (Reflexed)     Status: Abnormal   Collection Time: 02/06/22  8:20 PM  Result Value Ref Range Status   Enterococcus faecalis NOT DETECTED NOT DETECTED Final   Enterococcus Faecium NOT DETECTED NOT DETECTED Final   Listeria monocytogenes NOT DETECTED NOT DETECTED Final   Staphylococcus species DETECTED (A) NOT DETECTED Final    Comment: CRITICAL RESULT CALLED TO, READ BACK BY AND VERIFIED WITH: PHARMD M BELL [id number] AT 1205 BY CM    Staphylococcus aureus (BCID) DETECTED (A) NOT DETECTED Final    Comment: Methicillin (oxacillin)-resistant Staphylococcus aureus (MRSA). MRSA is predictably resistant to beta-lactam antibiotics (except ceftaroline). Preferred therapy is vancomycin unless clinically contraindicated. Patient requires contact precautions if  hospitalized. CRITICAL RESULT CALLED TO, READ BACK BY AND VERIFIED  WITH: PHARMD M BELL [id number] AT 1205 BY CM    Staphylococcus epidermidis NOT DETECTED NOT DETECTED Final   Staphylococcus lugdunensis NOT DETECTED NOT DETECTED Final   Streptococcus species NOT DETECTED NOT DETECTED Final   Streptococcus agalactiae NOT DETECTED NOT DETECTED Final   Streptococcus pneumoniae NOT DETECTED  NOT DETECTED Final   Streptococcus pyogenes NOT DETECTED NOT DETECTED Final   A.calcoaceticus-baumannii NOT DETECTED NOT DETECTED Final   Bacteroides fragilis NOT DETECTED NOT DETECTED Final   Enterobacterales NOT DETECTED NOT DETECTED Final   Enterobacter cloacae complex NOT DETECTED NOT DETECTED Final   Escherichia coli NOT DETECTED NOT DETECTED Final   Klebsiella aerogenes NOT DETECTED NOT DETECTED Final   Klebsiella oxytoca NOT DETECTED NOT DETECTED Final   Klebsiella pneumoniae NOT DETECTED NOT DETECTED Final   Proteus species NOT DETECTED NOT DETECTED Final   Salmonella species NOT DETECTED NOT DETECTED Final   Serratia marcescens NOT DETECTED NOT DETECTED Final   Haemophilus influenzae NOT DETECTED NOT DETECTED Final   Neisseria meningitidis NOT DETECTED NOT DETECTED Final   Pseudomonas aeruginosa NOT DETECTED NOT DETECTED Final   Stenotrophomonas maltophilia NOT DETECTED NOT DETECTED Final   Candida albicans NOT DETECTED NOT DETECTED Final   Candida auris NOT DETECTED NOT DETECTED Final   Candida glabrata NOT DETECTED NOT DETECTED Final   Candida krusei NOT DETECTED NOT DETECTED Final   Candida parapsilosis NOT DETECTED NOT DETECTED Final   Candida tropicalis NOT DETECTED NOT DETECTED Final   Cryptococcus neoformans/gattii NOT DETECTED NOT DETECTED Final   Meth resistant mecA/C and MREJ DETECTED (A) NOT DETECTED Final    Comment: CRITICAL RESULT CALLED TO, READ BACK BY AND VERIFIED WITH: Oneta Rack [id number] AT 1205 BY CM Performed at [facility], [address], [place], [place] [address]   Resp Panel by RT-PCR (Flu A&B, Covid) Peripheral     Status:  None   Collection Time: 02/06/22  8:38 PM   Specimen: Peripheral; Nasopharyngeal(NP) swabs in vial transport medium  Result Value Ref Range Status   SARS Coronavirus 2 by RT PCR NEGATIVE NEGATIVE Final    Comment: (NOTE) SARS-CoV-2 target nucleic acids are NOT DETECTED.  The SARS-CoV-2 RNA is generally detectable in upper respiratory specimens during the acute phase of infection. The lowest concentration of SARS-CoV-2 viral copies this assay can detect is 138 copies/mL. A negative result does not preclude SARS-Cov-2 infection and should not be used as the sole basis for treatment or other patient management decisions. A negative result may occur with  improper specimen collection/handling, submission of specimen other than nasopharyngeal swab, presence of viral mutation(s) within the areas targeted by this assay, and inadequate number of viral copies(<138 copies/mL). A negative result must be combined with clinical observations, patient history, and epidemiological information. The expected result is Negative.  Fact Sheet for Patients:  BloggerCourse.com  Fact Sheet for Healthcare Providers:  SeriousBroker.it  This test is no t yet approved or cleared by the [place] FDA and  has been authorized for detection and/or diagnosis of SARS-CoV-2 by FDA under an Emergency Use Authorization (EUA). This EUA will remain  in effect (meaning this test can be used) for the duration of the COVID-19 declaration under Section 564(b)(1) of the Act, 21 U.S.C.section 360bbb-3(b)(1), unless the authorization is terminated  or revoked sooner.       Influenza A by PCR NEGATIVE NEGATIVE Final   Influenza B by PCR NEGATIVE NEGATIVE Final    Comment: (NOTE) The Xpert Xpress SARS-CoV-2/FLU/RSV plus assay is intended as an aid in the diagnosis of influenza from Nasopharyngeal swab specimens and should not be used as a sole basis for treatment.  Nasal washings and aspirates are unacceptable for Xpert Xpress SARS-CoV-2/FLU/RSV testing.  Fact Sheet for Patients: BloggerCourse.com  Fact Sheet for Healthcare Providers: SeriousBroker.it  This test is not yet approved or cleared by the [place] FDA and has been authorized for detection and/or diagnosis of SARS-CoV-2 by FDA under an Emergency Use Authorization (EUA). This EUA will remain in effect (meaning this test can be used) for the duration of the COVID-19 declaration under Section 564(b)(1) of the Act, 21 U.S.C. section 360bbb-3(b)(1), unless the authorization is terminated or revoked.  Performed at [facility], [address] [place], [place] [address]   Urine Culture     Status: Abnormal (Preliminary result)   Collection Time: 02/07/22 12:23 AM   Specimen: In/Out Cath Urine  Result Value Ref Range Status   Specimen Description   Final    IN/OUT CATH URINE Performed at [facility], [address] [place], [place] [address]    Special Requests   Final    NONE Performed at [facility], [address] [place], [place] [address]    Culture (A)  Final    >=100,000 COLONIES/mL STAPHYLOCOCCUS AUREUS SUSCEPTIBILITIES TO FOLLOW Performed at [facility], [address] [place], [place] [address]    Report Status PENDING  Incomplete  MRSA Next Gen by PCR, Nasal     Status: Abnormal   Collection Time: 02/07/22 12:43 AM   Specimen: Nasal Mucosa; Nasal Swab  Result Value Ref Range Status   MRSA by PCR Next Gen DETECTED (A) NOT DETECTED Final    Comment: CRITICAL RESULT CALLED TO, READ BACK BY AND VERIFIED WITH: CALLED TO ZAHOUREK,A AT [id number] ON 02/07/22 BY VAZQUEZ,J (NOTE) The GeneXpert MRSA Assay (FDA approved for NASAL specimens only), is one component of a comprehensive MRSA colonization surveillance program. It is not intended to diagnose MRSA  infection nor to guide or monitor treatment for MRSA infections. Test performance is not FDA approved in patients less than 32 years old. Performed at [facility], [address] [place], [place] [address]   Culture, blood (single) w Reflex to ID Panel     Status: Abnormal (Preliminary result)   Collection Time: 02/07/22  6:58 AM   Specimen: BLOOD  Result Value Ref Range Status   Specimen Description   Final    BLOOD BLOOD LEFT WRIST Performed at [facility], [address] [place], [place] [address]    Special Requests   Final    IN PEDIATRIC BOTTLE Blood Culture adequate volume Performed at [facility], [address] [place], [place] [address]    Culture  Setup Time   Final    GRAM POSITIVE COCCI IN CLUSTERS IN PEDIATRIC BOTTLE CRITICAL VALUE NOTED.  VALUE IS CONSISTENT WITH PREVIOUSLY REPORTED AND CALLED VALUE. Performed at [facility], [address] [place], [place] [address]    Culture STAPHYLOCOCCUS AUREUS (A)  Final   Report Status PENDING  Incomplete         Radiology Studies: CT Angio Chest Pulmonary Embolism (PE) W or WO Contrast  Result Date: 02/07/2022 CLINICAL DATA:  Sepsis and IV drug abuse. Fever and pleuritic chest pain. Left shoulder pain. EXAM: CT ANGIOGRAPHY CHEST WITH CONTRAST TECHNIQUE: Multidetector CT imaging of the chest was performed using the standard protocol during bolus administration of intravenous contrast. Multiplanar CT image reconstructions and MIPs were obtained to evaluate the vascular anatomy. RADIATION DOSE REDUCTION: This exam was performed according to the departmental dose-optimization program which includes automated exposure control, adjustment of the mA and/or kV according to patient size and/or use of  iterative reconstruction technique. CONTRAST:  16mL OMNIPAQUE IOHEXOL 350 MG/ML SOLN COMPARISON:  Chest radiograph 02/06/2022 FINDINGS: Cardiovascular: No filling  defect is identified in the pulmonary arterial tree to suggest pulmonary embolus. Mediastinum/Nodes: Ill-defined left supraclavicular adenopathy up to 1.1 cm in diameter with hazy indistinctness of tissue planes in the left supraclavicular region. Left lower neck level V lymph node 0.6 cm in short axis on image 2 series 5. Left level IV lymph node 0.8 cm in short axis on image 7 series 5. Indistinctly marginated suspected thoracic adenopathy including a 1.8 cm in short axis right eccentric subcarinal node, 0.9 cm left infrahilar lymph node,, and a 0.9 cm in short axis AP window lymph node. Right hilar node 1.1 cm in short axis on image 42 series 5. Indistinct stranding in the mediastinal tissues. Lungs/Pleura: Bilateral scattered pulmonary nodules of various density along with rounded regions of bilateral airspace opacity measuring up to about 6 cm in diameter. Some of these have internal gas density such as the 2.9 by 1.9 cm left lower lobe airspace opacity on image 98 of series 6, potentially from mild early cavitation. Secondary pulmonary lobular septal thickening at the lung apices. Airway thickening is present, suggesting bronchitis or reactive airways disease. Trace left pleural effusion. Upper Abdomen: Splenomegaly. Musculoskeletal: Diffuse subcutaneous edema. Review of the MIP images confirms the above findings. IMPRESSION: 1. Scattered bilateral mixed density nodules and rounded airspace opacities scattered in both lungs, with potential mild cavitation in one of the left lower lobe airspace opacities, and with indistinct mediastinal and hilar adenopathy. Given the clinical context, the appearance favors multifocal pneumonia and potentially septic emboli. However, no filling defect is identified in the pulmonary arterial tree to suggest bland pulmonary arterial thrombus. Surveillance chest imaging recommended to ensure expected improvement with therapy. 2. Ill-defined left supraclavicular adenopathy and  localized fat stranding slightly disproportionate to the diffuse subcutaneous edema. 3. Trace left pleural effusion. 4. Airway thickening is present, suggesting bronchitis or reactive airways disease. 5. Splenomegaly. Electronically Signed   By: Gaylyn Rong M.D.   On: 02/07/2022 11:49   MR CERVICAL SPINE WO CONTRAST  Result Date: 02/07/2022 CLINICAL DATA:  Neck pain with possible infection.  IV drug abuse. EXAM: MRI CERVICAL, THORACIC AND LUMBAR SPINE WITHOUT CONTRAST TECHNIQUE: Multiplanar and multiecho pulse sequences of the cervical spine, to include the craniocervical junction and cervicothoracic junction, and thoracic and lumbar spine, were obtained without intravenous contrast. COMPARISON:  None Available. FINDINGS: MRI CERVICAL SPINE FINDINGS Alignment: Physiologic. Vertebrae: No fracture, evidence of discitis, or bone lesion. Cord: Normal signal and morphology. Posterior Fossa, vertebral arteries, paraspinal tissues: Negative. Disc levels: No spinal canal or neural foraminal stenosis. MRI THORACIC SPINE FINDINGS Alignment:  Physiologic. Vertebrae: No fracture, evidence of discitis, or bone lesion. Cord:  Normal signal and morphology. Paraspinal and other soft tissues: There are multiple areas of consolidation within both lungs concerning for septic emboli. Disc levels: There is no spinal canal or neural foraminal stenosis. MRI LUMBAR SPINE FINDINGS Segmentation:  Standard Alignment:  Physiologic. Vertebrae:  No fracture, evidence of discitis, or bone lesion. Conus medullaris and cauda equina: Conus extends to the L1 level. Conus and cauda equina appear normal. Paraspinal and other soft tissues: Negative Disc levels: No spinal canal stenosis IMPRESSION: 1. Motion degraded examination. Additionally, postcontrast imaging could not be obtained due to patient inability to tolerate the full length examination. 2. No discitis-osteomyelitis or epidural abscess. 3. Multiple areas of consolidation within  both lungs concerning for septic emboli. Electronically Signed  By: Deatra Robinson M.D.   On: 02/07/2022 23:26   MR THORACIC SPINE WO CONTRAST  Result Date: 02/07/2022 CLINICAL DATA:  Neck pain with possible infection.  IV drug abuse. EXAM: MRI CERVICAL, THORACIC AND LUMBAR SPINE WITHOUT CONTRAST TECHNIQUE: Multiplanar and multiecho pulse sequences of the cervical spine, to include the craniocervical junction and cervicothoracic junction, and thoracic and lumbar spine, were obtained without intravenous contrast. COMPARISON:  None Available. FINDINGS: MRI CERVICAL SPINE FINDINGS Alignment: Physiologic. Vertebrae: No fracture, evidence of discitis, or bone lesion. Cord: Normal signal and morphology. Posterior Fossa, vertebral arteries, paraspinal tissues: Negative. Disc levels: No spinal canal or neural foraminal stenosis. MRI THORACIC SPINE FINDINGS Alignment:  Physiologic. Vertebrae: No fracture, evidence of discitis, or bone lesion. Cord:  Normal signal and morphology. Paraspinal and other soft tissues: There are multiple areas of consolidation within both lungs concerning for septic emboli. Disc levels: There is no spinal canal or neural foraminal stenosis. MRI LUMBAR SPINE FINDINGS Segmentation:  Standard Alignment:  Physiologic. Vertebrae:  No fracture, evidence of discitis, or bone lesion. Conus medullaris and cauda equina: Conus extends to the L1 level. Conus and cauda equina appear normal. Paraspinal and other soft tissues: Negative Disc levels: No spinal canal stenosis IMPRESSION: 1. Motion degraded examination. Additionally, postcontrast imaging could not be obtained due to patient inability to tolerate the full length examination. 2. No discitis-osteomyelitis or epidural abscess. 3. Multiple areas of consolidation within both lungs concerning for septic emboli. Electronically Signed   By: Deatra Robinson M.D.   On: 02/07/2022 23:26   MR LUMBAR SPINE WO CONTRAST  Result Date: 02/07/2022 CLINICAL DATA:   Neck pain with possible infection.  IV drug abuse. EXAM: MRI CERVICAL, THORACIC AND LUMBAR SPINE WITHOUT CONTRAST TECHNIQUE: Multiplanar and multiecho pulse sequences of the cervical spine, to include the craniocervical junction and cervicothoracic junction, and thoracic and lumbar spine, were obtained without intravenous contrast. COMPARISON:  None Available. FINDINGS: MRI CERVICAL SPINE FINDINGS Alignment: Physiologic. Vertebrae: No fracture, evidence of discitis, or bone lesion. Cord: Normal signal and morphology. Posterior Fossa, vertebral arteries, paraspinal tissues: Negative. Disc levels: No spinal canal or neural foraminal stenosis. MRI THORACIC SPINE FINDINGS Alignment:  Physiologic. Vertebrae: No fracture, evidence of discitis, or bone lesion. Cord:  Normal signal and morphology. Paraspinal and other soft tissues: There are multiple areas of consolidation within both lungs concerning for septic emboli. Disc levels: There is no spinal canal or neural foraminal stenosis. MRI LUMBAR SPINE FINDINGS Segmentation:  Standard Alignment:  Physiologic. Vertebrae:  No fracture, evidence of discitis, or bone lesion. Conus medullaris and cauda equina: Conus extends to the L1 level. Conus and cauda equina appear normal. Paraspinal and other soft tissues: Negative Disc levels: No spinal canal stenosis IMPRESSION: 1. Motion degraded examination. Additionally, postcontrast imaging could not be obtained due to patient inability to tolerate the full length examination. 2. No discitis-osteomyelitis or epidural abscess. 3. Multiple areas of consolidation within both lungs concerning for septic emboli. Electronically Signed   By: Deatra Robinson M.D.   On: 02/07/2022 23:26   DG Chest Port 1 View  Result Date: 02/06/2022 CLINICAL DATA:  Questionable sepsis. EXAM: PORTABLE CHEST 1 VIEW COMPARISON:  Chest x-ray 12/01/2015. FINDINGS: The heart size and mediastinal contours are within normal limits. Both lungs are clear. The  visualized skeletal structures are unremarkable. IMPRESSION: No active disease. Electronically Signed   By: Darliss Cheney M.D.   On: 02/06/2022 20:47   DG Shoulder Left Portable  Result Date: 02/06/2022 CLINICAL DATA:  Pain, fever, heroin abuse EXAM: LEFT SHOULDER COMPARISON:  None Available. FINDINGS: Frontal and lateral views of the left shoulder are obtained. There are no acute or destructive bony lesions. Joint spaces are well preserved. Soft tissues are unremarkable without subcutaneous gas or radiopaque foreign body. Visualized portions of the left chest are clear. IMPRESSION: 1. Unremarkable left shoulder. Electronically Signed   By: Sharlet Salina M.D.   On: 02/06/2022 20:43   ECHOCARDIOGRAM COMPLETE  Result Date: 02/07/2022    ECHOCARDIOGRAM REPORT   Patient Name:   SOLENE HEREFORD Date of Exam: 02/07/2022 Medical Rec #:  [id number]         Height:       60.0 in Accession #:    [id number]        Weight:       114.6 lb Date of Birth:  [birth year]         BSA:          1.473 m Patient Age:    30 years          BP:           103/67 mmHg Patient Gender: F                 HR:           108 bpm. Exam Location:  Inpatient Procedure: 2D Echo, 3D Echo, Cardiac Doppler and Color Doppler REPORT CONTAINS CRITICAL RESULT Indications:    Bacteremia  History:        Patient has no prior history of Echocardiogram examinations.                 Signs/Symptoms:Chest Pain; Risk Factors:Current Smoker. IVDU.  Sonographer:    Sheralyn Boatman RDCS Referring Phys: [id number] [facility]  Sonographer Comments: Technically difficult study due to poor echo windows. Patient supine with extreme pain in left shoulder. Attempted to move patient to the center of bed. Extremely difficult study due to reaching across bed. IMPRESSIONS  1. Left ventricular ejection fraction, by estimation, is 60 to 65%. The left ventricle has normal function. The left ventricle has no regional wall motion abnormalities. Left ventricular diastolic parameters  were normal.  2. Right ventricular systolic function is normal. The right ventricular size is normal.  3. The mitral valve is normal in structure. No evidence of mitral valve regurgitation. No evidence of mitral stenosis.  4. Likely vegetation on the TV However appears on the ventricular side of the valve Suggest TEE to further evaluate Large mobile mass appers attached to the TV chords.  5. The aortic valve is normal in structure. Aortic valve regurgitation is not visualized. No aortic stenosis is present.  6. The inferior vena cava is normal in size with greater than 50% respiratory variability, suggesting right atrial pressure of 3 mmHg. FINDINGS  Left Ventricle: Left ventricular ejection fraction, by estimation, is 60 to 65%. The left ventricle has normal function. The left ventricle has no regional wall motion abnormalities. The left ventricular internal cavity size was normal in size. There is  no left ventricular hypertrophy. Left ventricular diastolic parameters were normal. Right Ventricle: The right ventricular size is normal. No increase in right ventricular wall thickness. Right ventricular systolic function is normal. Left Atrium: Left atrial size was normal in size. Right Atrium: Right atrial size was normal in size. Pericardium: There is no evidence of pericardial effusion. Mitral Valve: The mitral valve is normal in structure. No evidence of mitral valve regurgitation. No evidence of mitral  valve stenosis. MV peak gradient, 10.2 mmHg. The mean mitral valve gradient is 4.0 mmHg. Tricuspid Valve: Likely vegetation on the TV However appears on the ventricular side of the valve Suggest TEE to further evaluate Large mobile mass appers attached to the TV chords. The tricuspid valve is normal in structure. Tricuspid valve regurgitation is mild . No evidence of tricuspid stenosis. Aortic Valve: The aortic valve is normal in structure. Aortic valve regurgitation is not visualized. No aortic stenosis is  present. Pulmonic Valve: The pulmonic valve was normal in structure. Pulmonic valve regurgitation is not visualized. No evidence of pulmonic stenosis. Aorta: The aortic root is normal in size and structure. Venous: The inferior vena cava is normal in size with greater than 50% respiratory variability, suggesting right atrial pressure of 3 mmHg. IAS/Shunts: No atrial level shunt detected by color flow Doppler.  LEFT VENTRICLE PLAX 2D LVIDd:         4.30 cm     Diastology LVIDs:         2.80 cm     LV e' medial:    6.66 cm/s LV PW:         0.90 cm     LV E/e' medial:  17.9 LV IVS:        0.90 cm     LV e' lateral:   10.30 cm/s LVOT diam:     1.70 cm     LV E/e' lateral: 11.6 LV SV:         42 LV SV Index:   29 LVOT Area:     2.27 cm                             3D Volume EF: LV Volumes (MOD)           3D EF:        61 % LV vol d, MOD A2C: 61.3 ml LV EDV:       73 ml LV vol d, MOD A4C: 49.9 ml LV ESV:       28 ml LV vol s, MOD A2C: 19.7 ml LV SV:        44 ml LV vol s, MOD A4C: 17.9 ml LV SV MOD A2C:     41.6 ml LV SV MOD A4C:     49.9 ml LV SV MOD BP:      39.8 ml RIGHT VENTRICLE             IVC RV S prime:     13.00 cm/s  IVC diam: 2.00 cm TAPSE (M-mode): 1.9 cm LEFT ATRIUM             Index        RIGHT ATRIUM           Index LA diam:        2.70 cm 1.83 cm/m   RA Area:     10.00 cm LA Vol (A2C):   16.3 ml 11.06 ml/m  RA Volume:   22.00 ml  14.93 ml/m LA Vol (A4C):   16.6 ml 11.27 ml/m LA Biplane Vol: 17.6 ml 11.94 ml/m  AORTIC VALVE LVOT Vmax:   138.00 cm/s LVOT Vmean:  90.000 cm/s LVOT VTI:    0.186 m  AORTA Ao Root diam: 2.80 cm Ao Asc diam:  2.90 cm MITRAL VALVE MV Area (PHT): 5.46 cm     SHUNTS MV Area VTI:   1.37 cm  Systemic VTI:  0.19 m MV Peak grad:  10.2 mmHg    Systemic Diam: 1.70 cm MV Mean grad:  4.0 mmHg MV Vmax:       1.60 m/s MV Vmean:      98.9 cm/s MV Decel Time: 139 msec MV E velocity: 119.00 cm/s MV A velocity: 109.50 cm/s MV E/A ratio:  1.09 Charlton Haws MD Electronically signed by  Charlton Haws MD Signature Date/Time: 02/07/2022/6:15:21 PM    Final    US Abdomen Limited RUQ (LIVER/GB)  Result Date: 02/07/2022 CLINICAL DATA:  31 year old female with sepsis and abnormal LFTs. EXAM: ULTRASOUND ABDOMEN LIMITED RIGHT UPPER QUADRANT COMPARISON:  None Available. FINDINGS: Gallbladder: Partially contracted gallbladder but with thickened and edematous appearing gallbladder wall (4 mm image 4). Sludge within the neck of the gallbladder (image 5). No shadowing echogenic stones identified. No sonographic Murphy sign elicited. Common bile duct: Diameter: 4 mm, normal. Liver: Hepatic starry sky appearance (image 8) raising the possibility of hepatic parenchymal edema. No intrahepatic biliary ductal dilatation suspected. No discrete liver lesion. Portal vein is patent on color Doppler imaging with normal direction of blood flow towards the liver. Other: Negative visible right kidney. There is trace perihepatic ascites (image 52). IMPRESSION: 1. Accentuated appearance of the portal triads which can be seen in the setting of hepatic parenchymal edema. There is trace perihepatic ascites. 2. Partially contracted gallbladder with edematous wall and sludge. Gallbladder edema reactive to #1 seems more likely than acalculus cholecystitis. 3. No evidence of biliary duct obstruction. Electronically Signed   By: Odessa Fleming M.D.   On: 02/07/2022 06:50        Scheduled Meds:  Chlorhexidine Gluconate Cloth  6 each Topical Daily   enoxaparin (LOVENOX) injection  40 mg Subcutaneous Q24H   mouth rinse  15 mL Mouth Rinse BID   mupirocin ointment   Nasal BID   sodium chloride flush  3 mL Intravenous Q12H   Continuous Infusions:  lactated ringers     vancomycin Stopped (02/08/22 0028)          Glade Lloyd, MD [facility] 02/08/2022, 11:52 AM

## 2022-02-08 NOTE — Progress Notes (Addendum)
[facility]  Date of Admission:  02/06/2022      Abx: 5/18-c vanc  ASSESSMENT: Mrsa septicemia with left shoulder pain and back pain and septic pulm emboli   Chest cta with changes c/w septic pulm emboli Mri spine 5/19 without obvious abscess/phlegmon or osseous involvement Pending mri left shoulder 5/19 tte suggest tv vegetation with "large mobile mass" on tv chord  5/18 urine cx also staph aureus spill over from bsi. No cva tenderness or hematuria Given persistent bacteremia (5/18 and 5/19), would benefit from further tee to help evaluate nature of the mobile mass and see if angiovac would be an option   Wbc rising but fever curve improving She appears still clinically with diffuse myalgia and not mentating well  PLAN: Continue vanc. Anticipate 8 weeks at least total abx treatment Would get tee If mentation continues to not improve within 24 hours would consider getting brain mri Discussed with primary team  I spent more than 50 minute reviewing data/chart, and coordinating care and >50% direct face to face time providing counseling/discussing diagnostics/treatment plan with patient   Principal Problem:   Sepsis (HCC) Active Problems:   Hyponatremia   Elevated LFTs   IVDU (intravenous drug user)   Left shoulder pain   Chest pain, pleuritic   Allergies  Allergen Reactions   Doxycycline Other (See Comments)    REACTION: Joint Pain    Scheduled Meds:  Chlorhexidine Gluconate Cloth  6 each Topical Daily   enoxaparin (LOVENOX) injection  40 mg Subcutaneous Q24H   mouth rinse  15 mL Mouth Rinse BID   mupirocin ointment   Nasal BID   sodium chloride flush  3 mL Intravenous Q12H   Continuous Infusions:  lactated ringers     vancomycin Stopped (02/08/22 0028)   PRN Meds:.ALPRAZolam, guaiFENesin, HYDROmorphone (DILAUDID) injection, ibuprofen, ondansetron (ZOFRAN) IV   SUBJECTIVE: Patient somnolent this morning Limited  ros  Pending left shoulder mri Tte mentioned large tv mobile mass Fever curve improving  Review of Systems: ROS All other ROS was negative, except mentioned above     OBJECTIVE: Vitals:   02/08/22 0500 02/08/22 0600 02/08/22 0800 02/08/22 0900  BP: (!) 102/56 (!) 110/48 (!) 86/59 119/87  Pulse: (!) 130 (!) 127 (!) 133 (!) 131  Resp: (!) 38 (!) 37 20 (!) 40  Temp:   98.5 F (36.9 C)   TempSrc:   Oral   SpO2: 98% 96% 99% 99%  Weight:      Height:       Body mass index is 22.39 kg/m.  Physical Exam  General/constitutional: will appearing; somnelent/sedated. Not able to open eyes but appear to understand (when I asked to palpate her left shoulder, she goes into preemptive guarding while eyes closed) HEENT: Normocephalic, PER, Conj Clear CV: rrr no mrg Lungs: clear to auscultation, normal respiratory effort Abd: Soft, Nontender Ext: no edema Skin: No Rash Neuro: sedated/somnolent MSK: appear to be tender everywhere I palpate on trunk/abd/extremities.    Lab Results Lab Results  Component Value Date   WBC 17.0 (H) 02/08/2022   HGB 12.4 02/08/2022   HCT 36.5 02/08/2022   MCV 74.8 (L) 02/08/2022   PLT 126 (L) 02/08/2022    Lab Results  Component Value Date   CREATININE 0.53 02/08/2022   BUN 10 02/08/2022   NA 134 (L) 02/08/2022   K 4.1 02/08/2022   CL 101 02/08/2022   CO2 26 02/08/2022  Lab Results  Component Value Date   ALT 213 (H) 02/08/2022   AST 112 (H) 02/08/2022   ALKPHOS 109 02/08/2022   BILITOT 5.4 (H) 02/08/2022      Microbiology: Recent Results (from the past 240 hour(s))  Blood culture (routine x 2)     Status: Abnormal (Preliminary result)   Collection Time: 02/06/22  8:15 PM   Specimen: BLOOD  Result Value Ref Range Status   Specimen Description   Final    BLOOD RIGHT ANTECUBITAL Performed at [facility], [address] [place], [place] [address]    Special Requests   Final    BOTTLES DRAWN AEROBIC ONLY Blood  Culture results may not be optimal due to an inadequate volume of blood received in culture bottles Performed at [facility], [address] [place], [place] [address]    Culture  Setup Time   Final    GRAM POSITIVE COCCI IN CLUSTERS AEROBIC BOTTLE ONLY Performed at [facility], [address] [place], [place] [address]    Culture STAPHYLOCOCCUS AUREUS (A)  Final   Report Status PENDING  Incomplete  Blood culture (routine x 2)     Status: Abnormal (Preliminary result)   Collection Time: 02/06/22  8:20 PM   Specimen: BLOOD  Result Value Ref Range Status   Specimen Description   Final    BLOOD BLOOD RIGHT HAND Performed at [facility], [address] [place], [place] [address]    Special Requests   Final    BOTTLES DRAWN AEROBIC AND ANAEROBIC Blood Culture results may not be optimal due to an excessive volume of blood received in culture bottles Performed at [facility], [address] [place], [place] [address]    Culture  Setup Time   Final    GRAM POSITIVE COCCI IN CLUSTERS IN BOTH AEROBIC AND ANAEROBIC BOTTLES CRITICAL RESULT CALLED TO, READ BACK BY AND VERIFIED WITH: PHARMD M BELL [id number] AT 1205 BY CM    Culture (A)  Final    STAPHYLOCOCCUS AUREUS SUSCEPTIBILITIES TO FOLLOW Performed at [facility], [address] [place], [place] [address]    Report Status PENDING  Incomplete  Blood Culture ID Panel (Reflexed)     Status: Abnormal   Collection Time: 02/06/22  8:20 PM  Result Value Ref Range Status   Enterococcus faecalis NOT DETECTED NOT DETECTED Final   Enterococcus Faecium NOT DETECTED NOT DETECTED Final   Listeria monocytogenes NOT DETECTED NOT DETECTED Final   Staphylococcus species DETECTED (A) NOT DETECTED Final    Comment: CRITICAL RESULT CALLED TO, READ BACK BY AND VERIFIED WITH: PHARMD M BELL [phone] AT 1205 BY CM    Staphylococcus aureus (BCID) DETECTED (A) NOT DETECTED Final     Comment: Methicillin (oxacillin)-resistant Staphylococcus aureus (MRSA). MRSA is predictably resistant to beta-lactam antibiotics (except ceftaroline). Preferred therapy is vancomycin unless clinically contraindicated. Patient requires contact precautions if  hospitalized. CRITICAL RESULT CALLED TO, READ BACK BY AND VERIFIED WITH: PHARMD M BELL [phone] AT 1205 BY CM    Staphylococcus epidermidis NOT DETECTED NOT DETECTED Final   Staphylococcus lugdunensis NOT DETECTED NOT DETECTED Final   Streptococcus species NOT DETECTED NOT DETECTED Final   Streptococcus agalactiae NOT DETECTED NOT DETECTED Final   Streptococcus pneumoniae NOT DETECTED NOT DETECTED Final   Streptococcus pyogenes NOT DETECTED NOT DETECTED Final   A.calcoaceticus-baumannii NOT DETECTED NOT DETECTED Final   Bacteroides fragilis NOT DETECTED NOT DETECTED Final  Enterobacterales NOT DETECTED NOT DETECTED Final   Enterobacter cloacae complex NOT DETECTED NOT DETECTED Final   Escherichia coli NOT DETECTED NOT DETECTED Final   Klebsiella aerogenes NOT DETECTED NOT DETECTED Final   Klebsiella oxytoca NOT DETECTED NOT DETECTED Final   Klebsiella pneumoniae NOT DETECTED NOT DETECTED Final   Proteus species NOT DETECTED NOT DETECTED Final   Salmonella species NOT DETECTED NOT DETECTED Final   Serratia marcescens NOT DETECTED NOT DETECTED Final   Haemophilus influenzae NOT DETECTED NOT DETECTED Final   Neisseria meningitidis NOT DETECTED NOT DETECTED Final   Pseudomonas aeruginosa NOT DETECTED NOT DETECTED Final   Stenotrophomonas maltophilia NOT DETECTED NOT DETECTED Final   Candida albicans NOT DETECTED NOT DETECTED Final   Candida auris NOT DETECTED NOT DETECTED Final   Candida glabrata NOT DETECTED NOT DETECTED Final   Candida krusei NOT DETECTED NOT DETECTED Final   Candida parapsilosis NOT DETECTED NOT DETECTED Final   Candida tropicalis NOT DETECTED NOT DETECTED Final   Cryptococcus neoformans/gattii NOT DETECTED NOT  DETECTED Final   Meth resistant mecA/C and MREJ DETECTED (A) NOT DETECTED Final    Comment: CRITICAL RESULT CALLED TO, READ BACK BY AND VERIFIED WITH: Oneta Rack [id number] AT 1205 BY CM Performed at [facility], [address] [place], [place] [address]   Resp Panel by RT-PCR (Flu A&B, Covid) Peripheral     Status: None   Collection Time: 02/06/22  8:38 PM   Specimen: Peripheral; Nasopharyngeal(NP) swabs in vial transport medium  Result Value Ref Range Status   SARS Coronavirus 2 by RT PCR NEGATIVE NEGATIVE Final    Comment: (NOTE) SARS-CoV-2 target nucleic acids are NOT DETECTED.  The SARS-CoV-2 RNA is generally detectable in upper respiratory specimens during the acute phase of infection. The lowest concentration of SARS-CoV-2 viral copies this assay can detect is 138 copies/mL. A negative result does not preclude SARS-Cov-2 infection and should not be used as the sole basis for treatment or other patient management decisions. A negative result may occur with  improper specimen collection/handling, submission of specimen other than nasopharyngeal swab, presence of viral mutation(s) within the areas targeted by this assay, and inadequate number of viral copies(<138 copies/mL). A negative result must be combined with clinical observations, patient history, and epidemiological information. The expected result is Negative.  Fact Sheet for Patients:  BloggerCourse.com  Fact Sheet for Healthcare Providers:  SeriousBroker.it  This test is no t yet approved or cleared by the [place] FDA and  has been authorized for detection and/or diagnosis of SARS-CoV-2 by FDA under an Emergency Use Authorization (EUA). This EUA will remain  in effect (meaning this test can be used) for the duration of the COVID-19 declaration under Section 564(b)(1) of the Act, 21 U.S.C.section 360bbb-3(b)(1), unless the authorization is terminated  or  revoked sooner.       Influenza A by PCR NEGATIVE NEGATIVE Final   Influenza B by PCR NEGATIVE NEGATIVE Final    Comment: (NOTE) The Xpert Xpress SARS-CoV-2/FLU/RSV plus assay is intended as an aid in the diagnosis of influenza from Nasopharyngeal swab specimens and should not be used as a sole basis for treatment. Nasal washings and aspirates are unacceptable for Xpert Xpress SARS-CoV-2/FLU/RSV testing.  Fact Sheet for Patients: BloggerCourse.com  Fact Sheet for Healthcare Providers: SeriousBroker.it  This test is not yet approved or cleared by the [place] FDA and has been authorized for detection and/or diagnosis of SARS-CoV-2 by FDA under an Emergency Use Authorization (EUA).  This EUA will remain in effect (meaning this test can be used) for the duration of the COVID-19 declaration under Section 564(b)(1) of the Act, 21 U.S.C. section 360bbb-3(b)(1), unless the authorization is terminated or revoked.  Performed at [facility], [address] [place], [place] [address]   Urine Culture     Status: Abnormal (Preliminary result)   Collection Time: 02/07/22 12:23 AM   Specimen: In/Out Cath Urine  Result Value Ref Range Status   Specimen Description   Final    IN/OUT CATH URINE Performed at [facility], [address] [place], [place] [address]    Special Requests   Final    NONE Performed at [facility], [address], [place], [place] [address]    Culture (A)  Final    >=100,000 COLONIES/mL STAPHYLOCOCCUS AUREUS SUSCEPTIBILITIES TO FOLLOW Performed at [facility], [address], [place], [place] [address]    Report Status PENDING  Incomplete  MRSA Next Gen by PCR, Nasal     Status: Abnormal   Collection Time: 02/07/22 12:43 AM   Specimen: Nasal Mucosa; Nasal Swab  Result Value Ref Range Status   MRSA by PCR Next Gen DETECTED (A) NOT DETECTED  Final    Comment: CRITICAL RESULT CALLED TO, READ BACK BY AND VERIFIED WITH: CALLED TO ZAHOUREK,A AT 9476 ON 02/07/22 BY VAZQUEZ,J (NOTE) The GeneXpert MRSA Assay (FDA approved for NASAL specimens only), is one component of a comprehensive MRSA colonization surveillance program. It is not intended to diagnose MRSA infection nor to guide or monitor treatment for MRSA infections. Test performance is not FDA approved in patients less than 5 years old. Performed at [facility], [address], [place], [place] [address]   Culture, blood (single) w Reflex to ID Panel     Status: Abnormal (Preliminary result)   Collection Time: 02/07/22  6:58 AM   Specimen: BLOOD  Result Value Ref Range Status   Specimen Description   Final    BLOOD BLOOD LEFT WRIST Performed at [facility], [address] [place], [place] [address]    Special Requests   Final    IN PEDIATRIC BOTTLE Blood Culture adequate volume Performed at [facility], [address] [place], [place] [address]    Culture  Setup Time   Final    GRAM POSITIVE COCCI IN CLUSTERS IN PEDIATRIC BOTTLE CRITICAL VALUE NOTED.  VALUE IS CONSISTENT WITH PREVIOUSLY REPORTED AND CALLED VALUE. Performed at [facility], [address] [place], [place] [address]    Culture STAPHYLOCOCCUS AUREUS (A)  Final   Report Status PENDING  Incomplete     Serology:   Imaging: If present, new imagings (plain films, ct scans, and mri) have been personally visualized and interpreted; radiology reports have been reviewed. Decision making incorporated into the Impression / Recommendations.  See hpi for mri and tte finding   Raymondo Band, MD [facility] [phone] pager    02/08/2022, 10:20 AM

## 2022-02-08 NOTE — Progress Notes (Signed)
ABG obtained and sent to lab. Lab notified at 1417.

## 2022-02-09 ENCOUNTER — Inpatient Hospital Stay (HOSPITAL_COMMUNITY): Payer: Medicaid Other

## 2022-02-09 DIAGNOSIS — R52 Pain, unspecified: Secondary | ICD-10-CM | POA: Diagnosis not present

## 2022-02-09 DIAGNOSIS — A419 Sepsis, unspecified organism: Secondary | ICD-10-CM | POA: Diagnosis not present

## 2022-02-09 DIAGNOSIS — R7989 Other specified abnormal findings of blood chemistry: Secondary | ICD-10-CM | POA: Diagnosis not present

## 2022-02-09 DIAGNOSIS — M00012 Staphylococcal arthritis, left shoulder: Secondary | ICD-10-CM | POA: Diagnosis not present

## 2022-02-09 DIAGNOSIS — A4902 Methicillin resistant Staphylococcus aureus infection, unspecified site: Secondary | ICD-10-CM | POA: Diagnosis not present

## 2022-02-09 DIAGNOSIS — I079 Rheumatic tricuspid valve disease, unspecified: Secondary | ICD-10-CM | POA: Diagnosis not present

## 2022-02-09 DIAGNOSIS — M869 Osteomyelitis, unspecified: Secondary | ICD-10-CM

## 2022-02-09 DIAGNOSIS — R6521 Severe sepsis with septic shock: Secondary | ICD-10-CM | POA: Diagnosis not present

## 2022-02-09 DIAGNOSIS — K7200 Acute and subacute hepatic failure without coma: Secondary | ICD-10-CM | POA: Diagnosis not present

## 2022-02-09 LAB — CBC
HCT: 32.7 % — ABNORMAL LOW (ref 36.0–46.0)
Hemoglobin: 10.6 g/dL — ABNORMAL LOW (ref 12.0–15.0)
MCH: 24.2 pg — ABNORMAL LOW (ref 26.0–34.0)
MCHC: 32.4 g/dL (ref 30.0–36.0)
MCV: 74.7 fL — ABNORMAL LOW (ref 80.0–100.0)
Platelets: 149 10*3/uL — ABNORMAL LOW (ref 150–400)
RBC: 4.38 MIL/uL (ref 3.87–5.11)
RDW: 22.2 % — ABNORMAL HIGH (ref 11.5–15.5)
WBC: 20.5 10*3/uL — ABNORMAL HIGH (ref 4.0–10.5)
nRBC: 0 % (ref 0.0–0.2)

## 2022-02-09 LAB — LACTIC ACID, PLASMA: Lactic Acid, Venous: 1.6 mmol/L (ref 0.5–1.9)

## 2022-02-09 LAB — CK: Total CK: 14 U/L — ABNORMAL LOW (ref 38–234)

## 2022-02-09 LAB — CULTURE, BLOOD (ROUTINE X 2)

## 2022-02-09 LAB — COMPREHENSIVE METABOLIC PANEL
ALT: 125 U/L — ABNORMAL HIGH (ref 0–44)
AST: 62 U/L — ABNORMAL HIGH (ref 15–41)
Albumin: 1.6 g/dL — ABNORMAL LOW (ref 3.5–5.0)
Alkaline Phosphatase: 125 U/L (ref 38–126)
Anion gap: 8 (ref 5–15)
BUN: 12 mg/dL (ref 6–20)
CO2: 24 mmol/L (ref 22–32)
Calcium: 7.7 mg/dL — ABNORMAL LOW (ref 8.9–10.3)
Chloride: 101 mmol/L (ref 98–111)
Creatinine, Ser: 0.67 mg/dL (ref 0.44–1.00)
GFR, Estimated: >60 mL/min (ref >60)
Glucose, Bld: 108 mg/dL — ABNORMAL HIGH (ref 70–99)
Potassium: 3.7 mmol/L (ref 3.5–5.1)
Sodium: 133 mmol/L — ABNORMAL LOW (ref 135–145)
Total Bilirubin: 5.7 mg/dL — ABNORMAL HIGH (ref 0.3–1.2)
Total Protein: 5.7 g/dL — ABNORMAL LOW (ref 6.5–8.1)

## 2022-02-09 LAB — PROCALCITONIN: Procalcitonin: 11.87 ng/mL

## 2022-02-09 LAB — CULTURE, BLOOD (SINGLE): Special Requests: ADEQUATE

## 2022-02-09 LAB — URINE CULTURE: Culture: >=100000 — AB

## 2022-02-09 LAB — GLUCOSE, CAPILLARY
Glucose-Capillary: 84 mg/dL (ref 70–99)
Glucose-Capillary: 76 mg/dL (ref 70–99)

## 2022-02-09 IMAGING — DX DG CHEST 1V PORT
1 series · 1 of 1 positions shown · non-contrast
Comparison: [DATE]

CLINICAL DATA: Pleural effusion

EXAM:
PORTABLE CHEST 1 VIEW

[chest ap]
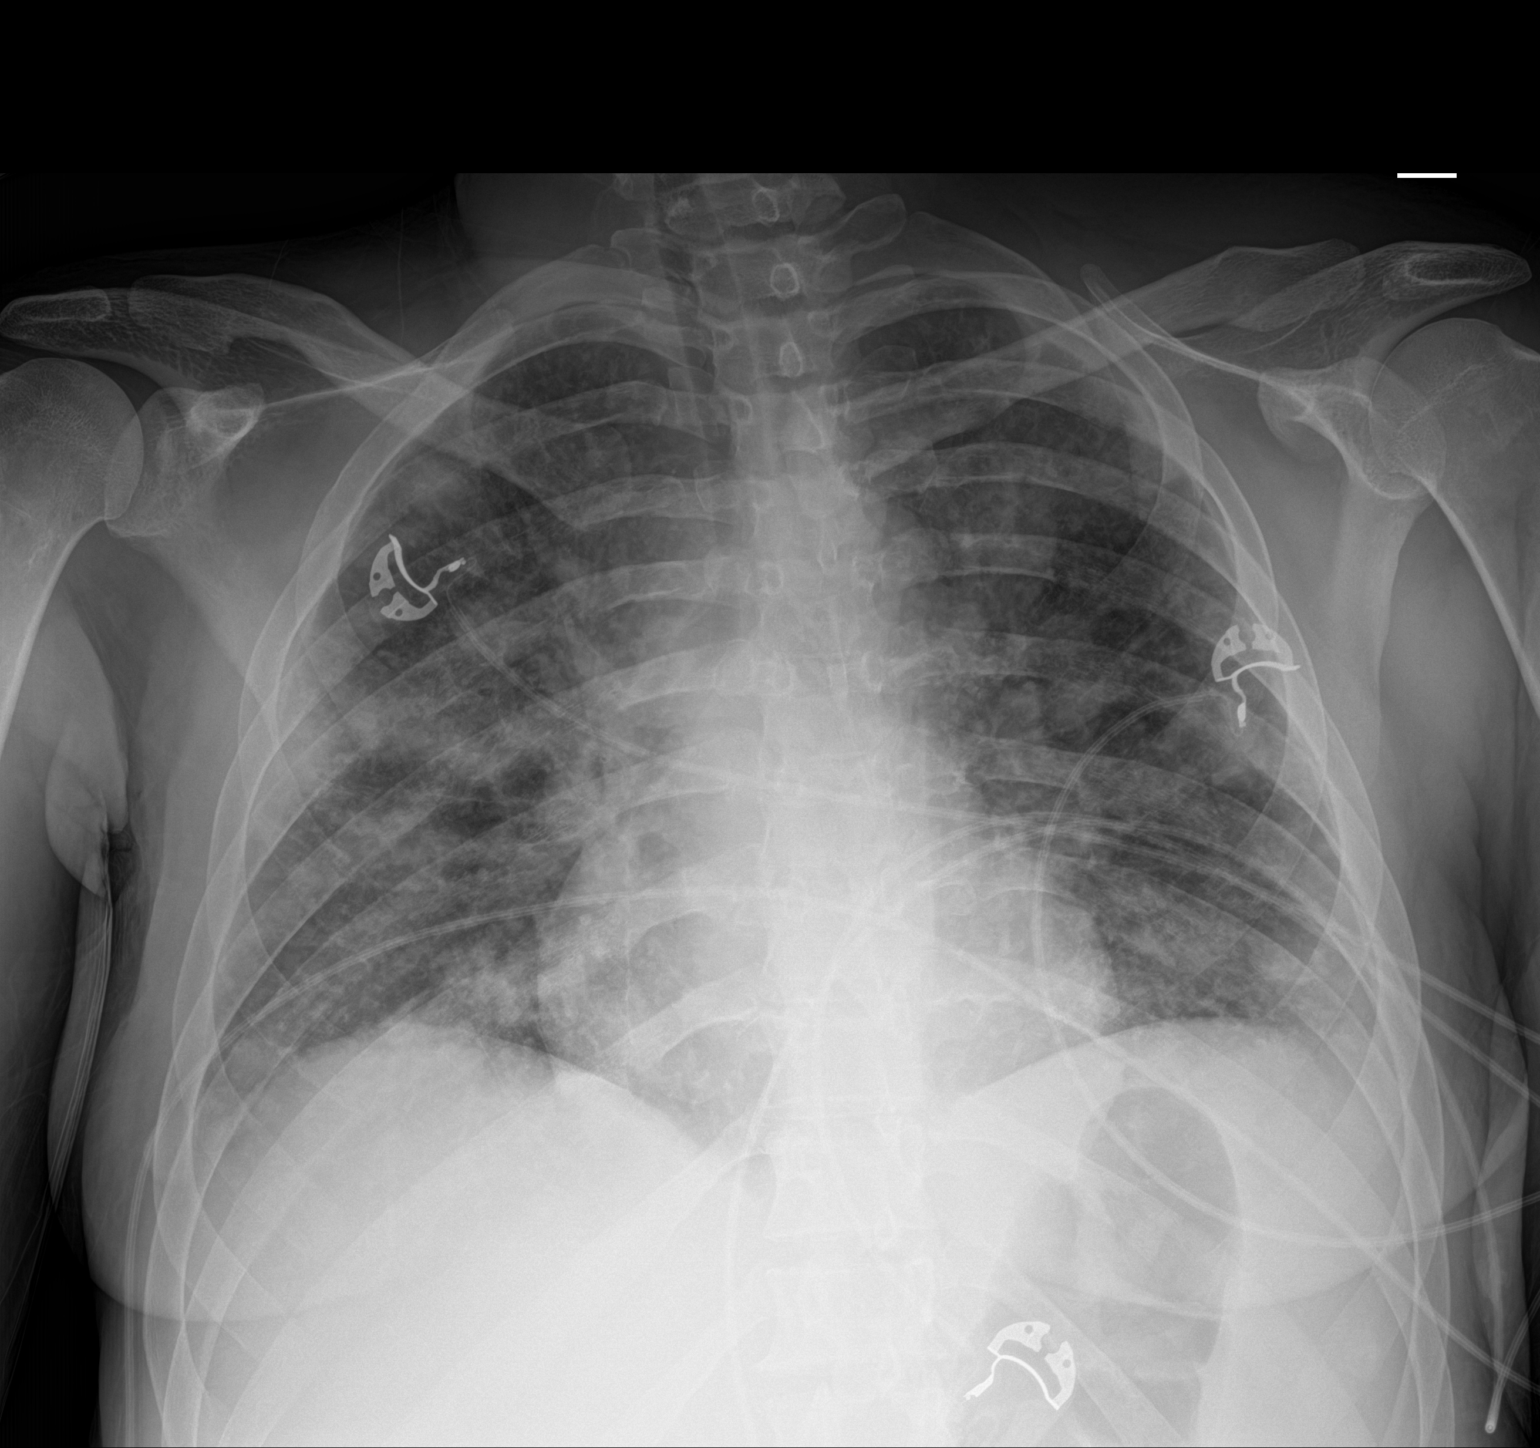

[1 of 1 positions shown; findings below may reference images not displayed]

FINDINGS: Normal heart size. Patchy bilateral airspace opacities with interval
worsening compared to prior. No large pleural fluid collection. No
pneumothorax.
IMPRESSION: Patchy bilateral airspace opacities with interval worsening compared
to prior.

## 2022-02-09 MED ORDER — DAPTOMYCIN 500 MG IV SOLR
8.0000 mg/kg | Freq: Every day | INTRAVENOUS | Status: DC
  Administered 2022-02-09 – 2022-02-14 (×6): 500 mg via INTRAVENOUS
  Filled 2022-02-09 (×7): qty 10

## 2022-02-09 MED ORDER — METOPROLOL TARTRATE 5 MG/5ML IV SOLN
2.5000 mg | Freq: Once | INTRAVENOUS | Status: AC
  Administered 2022-02-09: 2.5 mg via INTRAVENOUS
  Filled 2022-02-09: qty 5

## 2022-02-09 MED ORDER — ALPRAZOLAM 0.5 MG PO TABS
1.0000 mg | ORAL_TABLET | Freq: Three times a day (TID) | ORAL | Status: DC | PRN
  Administered 2022-02-09 – 2022-02-10 (×2): 1 mg via ORAL
  Filled 2022-02-09 (×2): qty 2

## 2022-02-09 MED ORDER — OXYCODONE HCL 5 MG PO TABS: 10.0000 mg | ORAL_TABLET | Freq: Three times a day (TID) | ORAL | Status: DC

## 2022-02-09 MED ORDER — MAGNESIUM SULFATE 2 GM/50ML IV SOLN
2.0000 g | Freq: Once | INTRAVENOUS | Status: AC
Start: 2022-02-09 — End: 2022-02-09
  Administered 2022-02-09: 2 g via INTRAVENOUS
  Filled 2022-02-09: qty 50

## 2022-02-09 MED ORDER — HYDROMORPHONE HCL 1 MG/ML IJ SOLN
1.0000 mg | Freq: Once | INTRAMUSCULAR | Status: AC
  Administered 2022-02-09: 1 mg via INTRAVENOUS

## 2022-02-09 MED ORDER — NICOTINE 14 MG/24HR TD PT24
14.0000 mg | MEDICATED_PATCH | Freq: Every day | TRANSDERMAL | Status: DC
  Administered 2022-02-09 – 2022-02-26 (×18): 14 mg via TRANSDERMAL
  Filled 2022-02-09 (×18): qty 1

## 2022-02-09 MED ORDER — ALBUTEROL SULFATE (2.5 MG/3ML) 0.083% IN NEBU
2.5000 mg | INHALATION_SOLUTION | RESPIRATORY_TRACT | Status: DC | PRN
Start: 2022-02-09 — End: 2022-02-27
  Administered 2022-02-14: 2.5 mg via RESPIRATORY_TRACT
  Filled 2022-02-09: qty 3

## 2022-02-09 MED ORDER — METOPROLOL TARTRATE 12.5 MG HALF TABLET
12.5000 mg | ORAL_TABLET | Freq: Two times a day (BID) | ORAL | Status: DC
  Administered 2022-02-10 – 2022-02-26 (×28): 12.5 mg via ORAL
  Filled 2022-02-09 (×31): qty 1

## 2022-02-09 MED ORDER — METOPROLOL TARTRATE 5 MG/5ML IV SOLN: 2.5000 mg | INTRAVENOUS | Status: DC | PRN

## 2022-02-09 MED ORDER — SODIUM CHLORIDE 0.9 % IV BOLUS
1000.0000 mL | Freq: Once | INTRAVENOUS | Status: AC
  Administered 2022-02-09: 1000 mL via INTRAVENOUS

## 2022-02-09 MED ORDER — SODIUM CHLORIDE 0.9 % IV SOLN
600.0000 mg | Freq: Three times a day (TID) | INTRAVENOUS | Status: DC
  Administered 2022-02-09 – 2022-02-14 (×15): 600 mg via INTRAVENOUS
  Filled 2022-02-09 (×17): qty 20

## 2022-02-09 MED ORDER — OXYCODONE HCL 5 MG PO TABS
10.0000 mg | ORAL_TABLET | Freq: Four times a day (QID) | ORAL | Status: DC
  Administered 2022-02-09 (×2): 10 mg via ORAL
  Filled 2022-02-09 (×3): qty 2

## 2022-02-09 NOTE — Progress Notes (Signed)
Patient having c/o chest pain, similar to prior instances and describes it as pressure but not as intense as before. Increased respiratory rate and hot flashes.  BP 106/94 HR 138 RR 34 SPO2 100% on 2L [place] for comfort  EKG obtained - ST 130's, placed in chart.  Administered dilaudid and xanax, SEE MAR.   Patient resting after medication administration, eyes closed, RR even. Safety sitter at bedside and [place] Crane, MD made aware.

## 2022-02-09 NOTE — Progress Notes (Signed)
[facility]  Date of Admission:  02/06/2022      Abx: 5/18-c vanc  ASSESSMENT: Mrsa septicemia with left shoulder pain and back pain and septic pulm emboli   Chest cta with changes c/w septic pulm emboli Mri spine 5/19 without obvious abscess/phlegmon or osseous involvement Pending mri left shoulder 5/19 tte suggest tv vegetation with "large mobile mass" on tv chord  5/18 urine cx also staph aureus spill over from bsi. No cva tenderness or hematuria Given persistent bacteremia (5/18 and 5/19), would benefit from further tee to help evaluate nature of the mobile mass and see if angiovac would be an option  ------ 5/21 assessment Still clinically very ill with ongoing bacteremia Await tee and ct surgery evaluation for angiovac 5/19 left shoulder mri read no drainable abscess; ac joint effusion and clavicle/ac osteomyelitis changes. Primary team had discussed with ortho who will evaluate patient  Mentation waxing/waning currently could continue to monitor for sign of septic emboli. Defer mri for now no headache or worsening confusion  Pulm involved for persistent tachycardia in setting ongoing sepsis   PLAN: Given persistent bacteremia with mrsa, while due to lack of source control, would change to daptomycin and ceftaroline Await tee Repeat bcx tomorrow Await cts and ortho evaluation Appreciate hospital med/pulmccm and surgical consulting services care Discussed with primary team  I spent more than 50 minute reviewing data/chart, and coordinating care and >50% direct face to face time providing counseling/discussing diagnostics/treatment plan with patient    Principal Problem:   Sepsis (HCC) Active Problems:   Hyponatremia   Elevated LFTs   IVDU (intravenous drug user)   Left shoulder pain   Chest pain, pleuritic   MRSA bacteremia   Septic pulmonary embolism without acute cor pulmonale (HCC)   Endocarditis of tricuspid  valve   Allergies  Allergen Reactions   Doxycycline Other (See Comments)    REACTION: Joint Pain    Scheduled Meds:  Chlorhexidine Gluconate Cloth  6 each Topical Daily   enoxaparin (LOVENOX) injection  40 mg Subcutaneous Q24H   mouth rinse  15 mL Mouth Rinse BID   mupirocin ointment   Nasal BID   nicotine  14 mg Transdermal Daily   oxyCODONE  10 mg Oral Q6H   sodium chloride flush  3 mL Intravenous Q12H   Continuous Infusions:  sodium chloride 125 mL/hr at 02/09/22 1158   ceFTAROline (TEFLARO) IV     DAPTOmycin (CUBICIN)  IV     lactated ringers     magnesium sulfate bolus IVPB     PRN Meds:.albuterol, ALPRAZolam, guaiFENesin, HYDROmorphone (DILAUDID) injection, ibuprofen, ondansetron (ZOFRAN) IV   SUBJECTIVE: Mentating well Complains pain all over  Reviewed mri finding with patient  Tachy Ongoing fever 5/20 bcx returned mrsa again  Review of Systems: ROS All other ROS was negative, except mentioned above     OBJECTIVE: Vitals:   02/09/22 0202 02/09/22 0317 02/09/22 0737 02/09/22 1100  BP: 102/66 100/75 111/75 110/73  Pulse: (!) 122 (!) 127 (!) 125   Resp: (!) 33 (!) 23 (!) 25   Temp: 98.1 F (36.7 C) 98 F (36.7 C) 98.4 F (36.9 C)   TempSrc: Oral Oral Oral   SpO2: 100% 100% 100%   Weight:  61.2 kg    Height:       Body mass index is 26.35 kg/m.  Physical Exam  General/constitutional: ill appearing; complains of pain all over HEENT: Normocephalic, PER, Conj Clear  CV: rrr no mrg Lungs: clear to auscultation, normal respiratory effort Abd: Soft, Nontender Ext: no edema Skin: No Rash Neuro: somnolent but nonfocal; appropriate in answering question/cooperative MSK: tender left shoulder and periarticular structure with guarding, tender body/trunk; no obvious swelling or tenderness knees/right shoulder/ankles/wrists   Lab Results Lab Results  Component Value Date   WBC 20.5 (H) 02/09/2022   HGB 10.6 (L) 02/09/2022   HCT 32.7 (L) 02/09/2022    MCV 74.7 (L) 02/09/2022   PLT 149 (L) 02/09/2022    Lab Results  Component Value Date   CREATININE 0.67 02/09/2022   BUN 12 02/09/2022   NA 133 (L) 02/09/2022   K 3.7 02/09/2022   CL 101 02/09/2022   CO2 24 02/09/2022    Lab Results  Component Value Date   ALT 125 (H) 02/09/2022   AST 62 (H) 02/09/2022   ALKPHOS 125 02/09/2022   BILITOT 5.7 (H) 02/09/2022      Microbiology: Recent Results (from the past 240 hour(s))  Blood culture (routine x 2)     Status: Abnormal   Collection Time: 02/06/22  8:15 PM   Specimen: BLOOD  Result Value Ref Range Status   Specimen Description   Final    BLOOD RIGHT ANTECUBITAL Performed at [facility], [address], [place], [place] [address]    Special Requests   Final    BOTTLES DRAWN AEROBIC ONLY Blood Culture results may not be optimal due to an inadequate volume of blood received in culture bottles Performed at [facility], [address], [place], [place] [address]    Culture  Setup Time   Final    GRAM POSITIVE COCCI IN CLUSTERS AEROBIC BOTTLE ONLY    Culture (A)  Final    STAPHYLOCOCCUS AUREUS SUSCEPTIBILITIES PERFORMED ON PREVIOUS CULTURE WITHIN THE LAST 5 DAYS. Performed at [facility], [address], [place], [place] [address]    Report Status 02/09/2022 FINAL  Final  Blood culture (routine x 2)     Status: Abnormal   Collection Time: 02/06/22  8:20 PM   Specimen: BLOOD  Result Value Ref Range Status   Specimen Description   Final    BLOOD BLOOD RIGHT HAND Performed at [facility], [address] [place], [place] [address]    Special Requests   Final    BOTTLES DRAWN AEROBIC AND ANAEROBIC Blood Culture results may not be optimal due to an excessive volume of blood received in culture bottles Performed at [facility], [address], [place], [place] [address]    Culture  Setup Time   Final    GRAM POSITIVE COCCI IN  CLUSTERS IN BOTH AEROBIC AND ANAEROBIC BOTTLES CRITICAL RESULT CALLED TO, READ BACK BY AND VERIFIED WITH: Oneta Rack [id number] AT 1205 BY CM Performed at [facility], [address], [place], [place] [address]    Culture METHICILLIN RESISTANT STAPHYLOCOCCUS AUREUS (A)  Final   Report Status 02/09/2022 FINAL  Final   Organism ID, Bacteria METHICILLIN RESISTANT STAPHYLOCOCCUS AUREUS  Final      Susceptibility   Methicillin resistant staphylococcus aureus - MIC*    CIPROFLOXACIN >=8 RESISTANT Resistant     ERYTHROMYCIN >=8 RESISTANT Resistant     GENTAMICIN <=0.5 SENSITIVE Sensitive     OXACILLIN >=4 RESISTANT Resistant     TETRACYCLINE <=1 SENSITIVE Sensitive     VANCOMYCIN 1 SENSITIVE Sensitive     TRIMETH/SULFA 160 RESISTANT Resistant  CLINDAMYCIN <=0.25 SENSITIVE Sensitive     RIFAMPIN <=0.5 SENSITIVE Sensitive     Inducible Clindamycin NEGATIVE Sensitive     * METHICILLIN RESISTANT STAPHYLOCOCCUS AUREUS  Blood Culture ID Panel (Reflexed)     Status: Abnormal   Collection Time: 02/06/22  8:20 PM  Result Value Ref Range Status   Enterococcus faecalis NOT DETECTED NOT DETECTED Final   Enterococcus Faecium NOT DETECTED NOT DETECTED Final   Listeria monocytogenes NOT DETECTED NOT DETECTED Final   Staphylococcus species DETECTED (A) NOT DETECTED Final    Comment: CRITICAL RESULT CALLED TO, READ BACK BY AND VERIFIED WITH: PHARMD M BELL [id number] AT 1205 BY CM    Staphylococcus aureus (BCID) DETECTED (A) NOT DETECTED Final    Comment: Methicillin (oxacillin)-resistant Staphylococcus aureus (MRSA). MRSA is predictably resistant to beta-lactam antibiotics (except ceftaroline). Preferred therapy is vancomycin unless clinically contraindicated. Patient requires contact precautions if  hospitalized. CRITICAL RESULT CALLED TO, READ BACK BY AND VERIFIED WITH: PHARMD M BELL [id number] AT 1205 BY CM    Staphylococcus epidermidis NOT DETECTED NOT DETECTED Final   Staphylococcus lugdunensis  NOT DETECTED NOT DETECTED Final   Streptococcus species NOT DETECTED NOT DETECTED Final   Streptococcus agalactiae NOT DETECTED NOT DETECTED Final   Streptococcus pneumoniae NOT DETECTED NOT DETECTED Final   Streptococcus pyogenes NOT DETECTED NOT DETECTED Final   A.calcoaceticus-baumannii NOT DETECTED NOT DETECTED Final   Bacteroides fragilis NOT DETECTED NOT DETECTED Final   Enterobacterales NOT DETECTED NOT DETECTED Final   Enterobacter cloacae complex NOT DETECTED NOT DETECTED Final   Escherichia coli NOT DETECTED NOT DETECTED Final   Klebsiella aerogenes NOT DETECTED NOT DETECTED Final   Klebsiella oxytoca NOT DETECTED NOT DETECTED Final   Klebsiella pneumoniae NOT DETECTED NOT DETECTED Final   Proteus species NOT DETECTED NOT DETECTED Final   Salmonella species NOT DETECTED NOT DETECTED Final   Serratia marcescens NOT DETECTED NOT DETECTED Final   Haemophilus influenzae NOT DETECTED NOT DETECTED Final   Neisseria meningitidis NOT DETECTED NOT DETECTED Final   Pseudomonas aeruginosa NOT DETECTED NOT DETECTED Final   Stenotrophomonas maltophilia NOT DETECTED NOT DETECTED Final   Candida albicans NOT DETECTED NOT DETECTED Final   Candida auris NOT DETECTED NOT DETECTED Final   Candida glabrata NOT DETECTED NOT DETECTED Final   Candida krusei NOT DETECTED NOT DETECTED Final   Candida parapsilosis NOT DETECTED NOT DETECTED Final   Candida tropicalis NOT DETECTED NOT DETECTED Final   Cryptococcus neoformans/gattii NOT DETECTED NOT DETECTED Final   Meth resistant mecA/C and MREJ DETECTED (A) NOT DETECTED Final    Comment: CRITICAL RESULT CALLED TO, READ BACK BY AND VERIFIED WITH: Oneta RackHARMD M BELL [phone] AT 1205 BY CM Performed at [facility], [address] [place], [place] [address]   Resp Panel by RT-PCR (Flu A&B, Covid) Peripheral     Status: None   Collection Time: 02/06/22  8:38 PM   Specimen: Peripheral; Nasopharyngeal(NP) swabs in vial transport medium  Result Value Ref  Range Status   SARS Coronavirus 2 by RT PCR NEGATIVE NEGATIVE Final    Comment: (NOTE) SARS-CoV-2 target nucleic acids are NOT DETECTED.  The SARS-CoV-2 RNA is generally detectable in upper respiratory specimens during the acute phase of infection. The lowest concentration of SARS-CoV-2 viral copies this assay can detect is 138 copies/mL. A negative result does not preclude SARS-Cov-2 infection and should not be used as the sole basis for treatment or other patient management decisions. A negative result may occur with  improper specimen collection/handling, submission of specimen other than nasopharyngeal swab, presence of viral mutation(s) within the areas targeted by this assay, and inadequate number of viral copies(<138 copies/mL). A negative result must be combined with clinical observations, patient history, and epidemiological information. The expected result is Negative.  Fact Sheet for Patients:  BloggerCourse.com  Fact Sheet for Healthcare Providers:  SeriousBroker.it  This test is no t yet approved or cleared by the [place] FDA and  has been authorized for detection and/or diagnosis of SARS-CoV-2 by FDA under an Emergency Use Authorization (EUA). This EUA will remain  in effect (meaning this test can be used) for the duration of the COVID-19 declaration under Section 564(b)(1) of the Act, 21 U.S.C.section 360bbb-3(b)(1), unless the authorization is terminated  or revoked sooner.       Influenza A by PCR NEGATIVE NEGATIVE Final   Influenza B by PCR NEGATIVE NEGATIVE Final    Comment: (NOTE) The Xpert Xpress SARS-CoV-2/FLU/RSV plus assay is intended as an aid in the diagnosis of influenza from Nasopharyngeal swab specimens and should not be used as a sole basis for treatment. Nasal washings and aspirates are unacceptable for Xpert Xpress SARS-CoV-2/FLU/RSV testing.  Fact Sheet for  Patients: BloggerCourse.com  Fact Sheet for Healthcare Providers: SeriousBroker.it  This test is not yet approved or cleared by the [place] FDA and has been authorized for detection and/or diagnosis of SARS-CoV-2 by FDA under an Emergency Use Authorization (EUA). This EUA will remain in effect (meaning this test can be used) for the duration of the COVID-19 declaration under Section 564(b)(1) of the Act, 21 U.S.C. section 360bbb-3(b)(1), unless the authorization is terminated or revoked.  Performed at [facility], [address], [place], [place] [address]   Urine Culture     Status: Abnormal   Collection Time: 02/07/22 12:23 AM   Specimen: In/Out Cath Urine  Result Value Ref Range Status   Specimen Description   Final    IN/OUT CATH URINE Performed at [facility], [address], [place], [place] [address]    Special Requests   Final    NONE Performed at [facility], [address] [place], [place] [address]    Culture (A)  Final    >=100,000 COLONIES/mL METHICILLIN RESISTANT STAPHYLOCOCCUS AUREUS   Report Status 02/09/2022 FINAL  Final   Organism ID, Bacteria METHICILLIN RESISTANT STAPHYLOCOCCUS AUREUS (A)  Final      Susceptibility   Methicillin resistant staphylococcus aureus - MIC*    CIPROFLOXACIN >=8 RESISTANT Resistant     GENTAMICIN <=0.5 SENSITIVE Sensitive     NITROFURANTOIN <=16 SENSITIVE Sensitive     OXACILLIN >=4 RESISTANT Resistant     TETRACYCLINE <=1 SENSITIVE Sensitive     VANCOMYCIN 1 SENSITIVE Sensitive     TRIMETH/SULFA >=320 RESISTANT Resistant     CLINDAMYCIN <=0.25 SENSITIVE Sensitive     RIFAMPIN <=0.5 SENSITIVE Sensitive     Inducible Clindamycin NEGATIVE Sensitive     * >=100,000 COLONIES/mL METHICILLIN RESISTANT STAPHYLOCOCCUS AUREUS  MRSA Next Gen by PCR, Nasal     Status: Abnormal   Collection Time: 02/07/22 12:43 AM    Specimen: Nasal Mucosa; Nasal Swab  Result Value Ref Range Status   MRSA by PCR Next Gen DETECTED (A) NOT DETECTED Final    Comment: CRITICAL RESULT CALLED TO, READ BACK BY AND VERIFIED WITH: CALLED TO ZAHOUREK,A AT 0102 ON 02/07/22 BY VAZQUEZ,J (NOTE) The GeneXpert MRSA Assay (FDA approved for NASAL specimens only), is  one component of a comprehensive MRSA colonization surveillance program. It is not intended to diagnose MRSA infection nor to guide or monitor treatment for MRSA infections. Test performance is not FDA approved in patients less than 25 years old. Performed at [facility], [address] [place], [place] [address]   Culture, blood (single) w Reflex to ID Panel     Status: Abnormal   Collection Time: 02/07/22  6:58 AM   Specimen: BLOOD  Result Value Ref Range Status   Specimen Description   Final    BLOOD BLOOD LEFT WRIST Performed at [facility], [address] [place], [place] [address]    Special Requests   Final    IN PEDIATRIC BOTTLE Blood Culture adequate volume Performed at [facility], [address], [place], [place] [address]    Culture  Setup Time   Final    GRAM POSITIVE COCCI IN CLUSTERS IN PEDIATRIC BOTTLE CRITICAL VALUE NOTED.  VALUE IS CONSISTENT WITH PREVIOUSLY REPORTED AND CALLED VALUE.    Culture (A)  Final    STAPHYLOCOCCUS AUREUS SUSCEPTIBILITIES PERFORMED ON PREVIOUS CULTURE WITHIN THE LAST 5 DAYS. Performed at [facility], [address], [place], [place] [address]    Report Status 02/09/2022 FINAL  Final  Culture, blood (Routine X 2) w Reflex to ID Panel     Status: Abnormal (Preliminary result)   Collection Time: 02/08/22  2:58 AM   Specimen: BLOOD  Result Value Ref Range Status   Specimen Description   Final    BLOOD BLOOD LEFT HAND Performed at [facility], [address] [place], [place] [address]    Special Requests   Final    IN PEDIATRIC  BOTTLE Blood Culture adequate volume Performed at [facility], [address] [place], [place] [address]    Culture  Setup Time   Final    GRAM POSITIVE COCCI IN CLUSTERS AEROBIC BOTTLE ONLY CRITICAL VALUE NOTED.  VALUE IS CONSISTENT WITH PREVIOUSLY REPORTED AND CALLED VALUE.    Culture (A)  Final    STAPHYLOCOCCUS AUREUS SUSCEPTIBILITIES PERFORMED ON PREVIOUS CULTURE WITHIN THE LAST 5 DAYS. Performed at [facility], [address], [place], [place] [address]    Report Status PENDING  Incomplete  Culture, blood (Routine X 2) w Reflex to ID Panel     Status: Abnormal (Preliminary result)   Collection Time: 02/08/22  2:59 AM   Specimen: BLOOD  Result Value Ref Range Status   Specimen Description   Final    BLOOD RIGHT ANTECUBITAL Performed at [facility], [address], [place], [place] [address]    Special Requests   Final    IN PEDIATRIC BOTTLE Blood Culture adequate volume Performed at [facility], [address] [place], [place] [address]    Culture  Setup Time   Final    GRAM POSITIVE COCCI IN CLUSTERS AEROBIC BOTTLE ONLY CRITICAL VALUE NOTED.  VALUE IS CONSISTENT WITH PREVIOUSLY REPORTED AND CALLED VALUE.    Culture (A)  Final    STAPHYLOCOCCUS AUREUS SUSCEPTIBILITIES PERFORMED ON PREVIOUS CULTURE WITHIN THE LAST 5 DAYS. Performed at [facility], [address], [place], [place] [address]    Report Status PENDING  Incomplete     Serology:   Imaging: If present, new imagings (plain films, ct scans, and mri) have been personally visualized and interpreted; radiology reports have been reviewed. Decision making incorporated into the  Impression / Recommendations.  5/19 mri left shoulder 1. Small acromioclavicular joint effusion with peripheral enhancement and surrounding soft tissue edema. Findings are suspicious for septic arthritis in the setting of known systemic infection. 2. Mild  subchondral bone marrow edema within the distal clavicle and adjacent acromion without a well-defined erosion. Findings may reflect reactive osteitis versus early acute osteomyelitis. 3. Diffuse soft tissue swelling about the shoulder with ill-defined fluid. No well-defined or rim enhancing fluid collections seen at this time. 4. No evidence of septic arthritis of the glenohumeral joint. 5. Multifocal right lung consolidations compatible with known multifocal pneumonia/septic emboli.   5/19 mri spine  No osseous/soft tissue abnormality    Raymondo Band, MD [facility] [phone] pager    02/09/2022, 12:05 PM

## 2022-02-09 NOTE — Progress Notes (Signed)
Primary team concerned she is too unstable for the floor. BP 92/72 after pain meds increased. Transfer orders for ICU placed. D/w RR RN to ensure they are aware of the transfer request. No change to orders currently as MAP remains appropriate.   Steffanie Dunn, DO 02/09/22 4:53 PM [facility]

## 2022-02-09 NOTE — Progress Notes (Addendum)
PROGRESS NOTE SANTOSHA JIVIDEN  ZOX:[id number] DOB: [birth year] DOA: 02/06/2022 PCP: Pcp, No   Brief Narrative/Hospital Course: 31 y.o. F W/ history significant for childhood asthma and intravenous drug abuse presented with fever, chest pain, cough and left shoulder pain. In ED-febrile, tachycardic.  Chest x-ray and left shoulder x-ray was negative for acute findings.  Sodium 128, alkaline phosphatase 163, AST 257, ALT 454, total bilirubin of 6.9 with WBCs of 17,900 and lactic acid of 4.  COVID-19 and influenza testing negative.  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  MRI of the spine without obvious abscess/phlegmon or osseous involvement.subsequently transferred from [facility] to [facility] 5/22 to consider TEE and CT surgery evaluation.       Subjective: Seen and examined this morning.  In distress moaning in pain but able to open eyes and interact and answer questions appropriately. Having persistent tachycardia up to 140s.  On 210 nasal cannula saturating 100%. Last temperature spike 102.8 yesterday afternoon.   Assessment and Plan: Principal Problem:   Sepsis (HCC) Active Problems:   Hyponatremia   Elevated LFTs   IVDU (intravenous drug user)   Left shoulder pain   Chest pain, pleuritic   MRSA bacteremia   Septic pulmonary embolism without acute cor pulmonale (HCC)   Endocarditis of tricuspid valve   Septic shock with endorgan damage MRSA bacteremia Possible tricuspid valve endocarditis Multifocal right lung consolidations-septic pulmonary emboli/multifocal pneumonia Lactic acidosis >9: Patient with MRSA bacteremia and concern for endocarditis, presenting with fever tachycardia lactic acidosis leukocytosis elevated LFTs.  Blood culture with MRSA  bacteremia.  2D echo with possible TV endocarditis, MRI of the spine without obvious abscess/phlegmon or osseous involvement, MRI left shoulder pending, ID, cardiology pulmonary has been consulted.subsequently transferred from [facility] to [facility] 5/22 to consider TEE and CT surgery evaluation.  Vancomycin changed to daptomycin PER id 02/09/22. Cont ivf monitor hemodynamic -PCCM has been consulted, see as ongoing tachycardia, worsening leukocytosis but blood pressure stable.  Doing well on 2 L nasal cannula.  Her last temperature spike 02/08/21 1 PM @  102.8.  Repeat blood culture 02/08/22 still positive. Tele sitter ordered for safety concern- drug use. Due to persistent tachycardia in 140,discussed Dr. Jerene Pitch from cardiology- so accordingly reconsulted pulmonary critical care to see if we need to escalate the level of care. Recent Labs  Lab 02/06/22 2020 02/07/22 0023 02/07/22 0340 02/07/22 0658 02/07/22 1429 02/07/22 1722 02/08/22 0304 02/08/22 0848 02/09/22 0340  WBC 17.9*  --   --  13.1*  --   --  17.0*  --  20.5*  LATICACIDVEN  --  4.4* >9.0* 2.8* 2.9* 2.9*  --   --   --   PROCALCITON  --   --   --  16.71  --   --   --  14.88 11.87    Acute metabolic encephalopathy patient had been lethargic.  This morning she was able to tell me her name current location, date and what is going on, acknowledges that she was doing drugs understand that she has overwhelming infection because of her IVDA.  I asked if she has any family that we could contact to update- she referred to boyfriend.  Hyponatremia: Mild stable Elevated LFTs: Liver dysfunction due to infection slightly.  LFTs slowly downtrending.  HCV antibody positive, HCVRNA pending IVDU: TOC consult once able  to help with resources and counseling  Left shoulder pain: MRI showed small acromioclavicular joint effusion, with peripheral enhancement and surrounding soft tissue edema-findings suspicious for septic arthritis in the setting of known systemic  infection, diffuse subcu swelling about the shoulder with ill-defined fluid, no evidence of septic arthritis of the glenohumeral joint multifocal right lung consolidation with multifocal pneumonia/septic emboli. Will consult Orthopedics.  At 1115 am got a call from Dr. August Saucer and discussed and he will see the patient for possible operative intervention-he advised to hold off on subcu Lovenox discontinued.  Chest pain, pleuritic: In the setting of septic emboli/focal pneumonia continue pain management. Thrombocytopenia: Due to sepsis.  Monitor for any signs of bleeding or transfusion need Hypokalemia-resolved Asthma: Continue bronchodilators as needed  DVT prophylaxis: enoxaparin (LOVENOX) injection 40 mg Start: 02/07/22 1000 Code Status:   Code Status: Full Code Family Communication: plan of care discussed with patient/RN at bedside. Patient status is: Inpatient because of ongoing plans of sepsis/MRSA bacteremia Level of care: Progressive   Dispo: The patient is from: UNKNOWN            Anticipated disposition: TBD Mobility Assessment (last 72 hours)     Mobility Assessment   No documentation.            Objective: Vitals last 24 hrs: Vitals:   02/09/22 0100 02/09/22 0202 02/09/22 0317 02/09/22 0737  BP: (!) 97/58 102/66 100/75 111/75  Pulse: (!) 130 (!) 122 (!) 127 (!) 125  Resp: (!) 37 (!) 33 (!) 23 (!) 25  Temp:  98.1 F (36.7 C) 98 F (36.7 C) 98.4 F (36.9 C)  TempSrc:  Oral Oral Oral  SpO2: 100% 100% 100% 100%  Weight:   61.2 kg   Height:       Weight change:   Physical Examination: General exam: Lethargic but able to wake up and answer questions appropriately.  Sick looking, and in moderate distress, moaning in pain.  Appears much older than her age. HEENT:Oral mucosa moist, Ear/Nose WNL grossly, dentition normal. Respiratory system: bilaterally diminished BS, no use of accessory muscle Cardiovascular system: S1 & S2 +, No JVD. Gastrointestinal system: Abdomen  soft,NT,ND, BS+ Nervous System:Alert, awake, moving extremities and grossly nonfocal Extremities: LE edema NON,distal peripheral pulses palpable.  Skin: No rashes,no icterus. MSK: Normal muscle bulk,tone, power  Medications reviewed:  Scheduled Meds:  Chlorhexidine Gluconate Cloth  6 each Topical Daily   enoxaparin (LOVENOX) injection  40 mg Subcutaneous Q24H   mouth rinse  15 mL Mouth Rinse BID   mupirocin ointment   Nasal BID   sodium chloride flush  3 mL Intravenous Q12H   Continuous Infusions:  sodium chloride 75 mL/hr at 02/08/22 1500   ceFTAROline (TEFLARO) IV     DAPTOmycin (CUBICIN)  IV     lactated ringers        Diet Order             Diet regular Room service appropriate? Yes; Fluid consistency: Thin  Diet effective now                            Intake/Output Summary (Last 24 hours) at 02/09/2022 1055 Last data filed at 02/08/2022 1500 Gross per 24 hour  Intake 98.42 ml  Output --  Net 98.42 ml   Net IO Since Admission: 3,761.29 mL [02/09/22 1055]  Wt Readings from Last 3 Encounters:  02/09/22 61.2 kg  11/30/15 79.4 kg  02/25/12 90.3  kg     Unresulted Labs (From admission, onward)     Start     Ordered   02/16/22 0500  CK  Once-Timed,   R       Question:  Specimen collection method  Answer:  Lab=Lab collect   02/09/22 1009   02/14/22 0500  Creatinine, serum  (enoxaparin (LOVENOX)    CrCl >/= 30 ml/min)  Weekly,   R     Comments: while on enoxaparin therapy   Question:  Specimen collection method  Answer:  Lab=Lab collect   02/07/22 0427   02/09/22 1009  CK  ONCE - URGENT,   URGENT       Question:  Specimen collection method  Answer:  Lab=Lab collect   02/09/22 1009   02/08/22 0500  HCV RNA quant  Tomorrow morning,   R       Question:  Specimen collection method  Answer:  Lab=Lab collect   02/07/22 1344          Data Reviewed: I have personally reviewed following labs and imaging studies CBC: Recent Labs  Lab 02/06/22 2020  02/07/22 0658 02/08/22 0304 02/09/22 0340  WBC 17.9* 13.1* 17.0* 20.5*  NEUTROABS 14.9*  --   --   --   HGB 12.2 10.7* 12.4 10.6*  HCT 37.0 32.9* 36.5 32.7*  MCV 76.1* 76.3* 74.8* 74.7*  PLT 187 126* 126* 149*   Basic Metabolic Panel: Recent Labs  Lab 02/06/22 2020 02/07/22 0658 02/08/22 0304 02/09/22 0340  NA 128* 130* 134* 133*  K 4.8 3.4* 4.1 3.7  CL 92* 98 101 101  CO2 24 23 26 24   GLUCOSE 118* 103* 94 108*  BUN 12 12 10 12   CREATININE 0.55 0.63 0.53 0.67  CALCIUM 8.3* 8.2* 8.0* 7.7*  MG  --   --  1.4*  --   PHOS  --   --  2.4*  --    GFR: Estimated Creatinine Clearance: 83.3 mL/min (by C-G formula based on SCr of 0.67 mg/dL). Liver Function Tests: Recent Labs  Lab 02/06/22 2020 02/07/22 0658 02/08/22 0304 02/09/22 0340  AST 257* 144* 112* 62*  ALT 454* 293* 213* 125*  ALKPHOS 163* 111 109 125  BILITOT 6.9* 6.7* 5.4* 5.7*  PROT 7.8 6.0* 5.7* 5.7*  ALBUMIN 3.0* 2.3* 2.0* 1.6*   No results for input(s): LIPASE, AMYLASE in the last 168 hours. No results for input(s): AMMONIA in the last 168 hours. Coagulation Profile: Recent Labs  Lab 02/06/22 2020  INR 1.3*   BNP (last 3 results) No results for input(s): PROBNP in the last 8760 hours. HbA1C: No results for input(s): HGBA1C in the last 72 hours. CBG: No results for input(s): GLUCAP in the last 168 hours. Lipid Profile: No results for input(s): CHOL, HDL, LDLCALC, TRIG, CHOLHDL, LDLDIRECT in the last 72 hours. Thyroid Function Tests: No results for input(s): TSH, T4TOTAL, FREET4, T3FREE, THYROIDAB in the last 72 hours. Sepsis Labs: Recent Labs  Lab 02/07/22 0340 02/07/22 0658 02/07/22 1429 02/07/22 1722 02/08/22 0848 02/09/22 0340  PROCALCITON  --  16.71  --   --  14.88 11.87  LATICACIDVEN >9.0* 2.8* 2.9* 2.9*  --   --     Recent Results (from the past 240 hour(s))  Blood culture (routine x 2)     Status: Abnormal   Collection Time: 02/06/22  8:15 PM   Specimen: BLOOD  Result Value Ref  Range Status   Specimen Description   Final    BLOOD  RIGHT ANTECUBITAL Performed at [facility], [address], [place], [place] [address]    Special Requests   Final    BOTTLES DRAWN AEROBIC ONLY Blood Culture results may not be optimal due to an inadequate volume of blood received in culture bottles Performed at [facility], [address] [place], [place] [address]    Culture  Setup Time   Final    GRAM POSITIVE COCCI IN CLUSTERS AEROBIC BOTTLE ONLY    Culture (A)  Final    STAPHYLOCOCCUS AUREUS SUSCEPTIBILITIES PERFORMED ON PREVIOUS CULTURE WITHIN THE LAST 5 DAYS. Performed at [facility], [address] [place], [place] [address]    Report Status 02/09/2022 FINAL  Final  Blood culture (routine x 2)     Status: Abnormal   Collection Time: 02/06/22  8:20 PM   Specimen: BLOOD  Result Value Ref Range Status   Specimen Description   Final    BLOOD BLOOD RIGHT HAND Performed at [facility], [address], [place], [place] [address]    Special Requests   Final    BOTTLES DRAWN AEROBIC AND ANAEROBIC Blood Culture results may not be optimal due to an excessive volume of blood received in culture bottles Performed at [facility], [address], [place], [place] [address]    Culture  Setup Time   Final    GRAM POSITIVE COCCI IN CLUSTERS IN BOTH AEROBIC AND ANAEROBIC BOTTLES CRITICAL RESULT CALLED TO, READ BACK BY AND VERIFIED WITH: Oneta Rack [id number] AT 1205 BY CM Performed at [facility], [address], [place], [place] [address]    Culture METHICILLIN RESISTANT STAPHYLOCOCCUS AUREUS (A)  Final   Report Status 02/09/2022 FINAL  Final   Organism ID, Bacteria METHICILLIN RESISTANT STAPHYLOCOCCUS AUREUS  Final      Susceptibility   Methicillin resistant staphylococcus aureus - MIC*    CIPROFLOXACIN >=8 RESISTANT Resistant     ERYTHROMYCIN >=8 RESISTANT Resistant     GENTAMICIN  <=0.5 SENSITIVE Sensitive     OXACILLIN >=4 RESISTANT Resistant     TETRACYCLINE <=1 SENSITIVE Sensitive     VANCOMYCIN 1 SENSITIVE Sensitive     TRIMETH/SULFA 160 RESISTANT Resistant     CLINDAMYCIN <=0.25 SENSITIVE Sensitive     RIFAMPIN <=0.5 SENSITIVE Sensitive     Inducible Clindamycin NEGATIVE Sensitive     * METHICILLIN RESISTANT STAPHYLOCOCCUS AUREUS  Blood Culture ID Panel (Reflexed)     Status: Abnormal   Collection Time: 02/06/22  8:20 PM  Result Value Ref Range Status   Enterococcus faecalis NOT DETECTED NOT DETECTED Final   Enterococcus Faecium NOT DETECTED NOT DETECTED Final   Listeria monocytogenes NOT DETECTED NOT DETECTED Final   Staphylococcus species DETECTED (A) NOT DETECTED Final    Comment: CRITICAL RESULT CALLED TO, READ BACK BY AND VERIFIED WITH: PHARMD M BELL [id number] AT 1205 BY CM    Staphylococcus aureus (BCID) DETECTED (A) NOT DETECTED Final    Comment: Methicillin (oxacillin)-resistant Staphylococcus aureus (MRSA). MRSA is predictably resistant to beta-lactam antibiotics (except ceftaroline). Preferred therapy is vancomycin unless clinically contraindicated. Patient requires contact precautions if  hospitalized. CRITICAL RESULT CALLED TO, READ BACK BY AND VERIFIED WITH: PHARMD M BELL [id number] AT 1205 BY CM    Staphylococcus epidermidis NOT DETECTED NOT DETECTED Final   Staphylococcus lugdunensis NOT DETECTED NOT DETECTED Final   Streptococcus species NOT DETECTED NOT DETECTED Final   Streptococcus agalactiae NOT DETECTED NOT DETECTED Final  Streptococcus pneumoniae NOT DETECTED NOT DETECTED Final   Streptococcus pyogenes NOT DETECTED NOT DETECTED Final   A.calcoaceticus-baumannii NOT DETECTED NOT DETECTED Final   Bacteroides fragilis NOT DETECTED NOT DETECTED Final   Enterobacterales NOT DETECTED NOT DETECTED Final   Enterobacter cloacae complex NOT DETECTED NOT DETECTED Final   Escherichia coli NOT DETECTED NOT DETECTED Final   Klebsiella aerogenes NOT  DETECTED NOT DETECTED Final   Klebsiella oxytoca NOT DETECTED NOT DETECTED Final   Klebsiella pneumoniae NOT DETECTED NOT DETECTED Final   Proteus species NOT DETECTED NOT DETECTED Final   Salmonella species NOT DETECTED NOT DETECTED Final   Serratia marcescens NOT DETECTED NOT DETECTED Final   Haemophilus influenzae NOT DETECTED NOT DETECTED Final   Neisseria meningitidis NOT DETECTED NOT DETECTED Final   Pseudomonas aeruginosa NOT DETECTED NOT DETECTED Final   Stenotrophomonas maltophilia NOT DETECTED NOT DETECTED Final   Candida albicans NOT DETECTED NOT DETECTED Final   Candida auris NOT DETECTED NOT DETECTED Final   Candida glabrata NOT DETECTED NOT DETECTED Final   Candida krusei NOT DETECTED NOT DETECTED Final   Candida parapsilosis NOT DETECTED NOT DETECTED Final   Candida tropicalis NOT DETECTED NOT DETECTED Final   Cryptococcus neoformans/gattii NOT DETECTED NOT DETECTED Final   Meth resistant mecA/C and MREJ DETECTED (A) NOT DETECTED Final    Comment: CRITICAL RESULT CALLED TO, READ BACK BY AND VERIFIED WITH: Oneta Rack [id number] AT 1205 BY CM Performed at [facility], [address] [place], [place] [address]   Resp Panel by RT-PCR (Flu A&B, Covid) Peripheral     Status: None   Collection Time: 02/06/22  8:38 PM   Specimen: Peripheral; Nasopharyngeal(NP) swabs in vial transport medium  Result Value Ref Range Status   SARS Coronavirus 2 by RT PCR NEGATIVE NEGATIVE Final    Comment: (NOTE) SARS-CoV-2 target nucleic acids are NOT DETECTED.  The SARS-CoV-2 RNA is generally detectable in upper respiratory specimens during the acute phase of infection. The lowest concentration of SARS-CoV-2 viral copies this assay can detect is 138 copies/mL. A negative result does not preclude SARS-Cov-2 infection and should not be used as the sole basis for treatment or other patient management decisions. A negative result may occur with  improper specimen collection/handling,  submission of specimen other than nasopharyngeal swab, presence of viral mutation(s) within the areas targeted by this assay, and inadequate number of viral copies(<138 copies/mL). A negative result must be combined with clinical observations, patient history, and epidemiological information. The expected result is Negative.  Fact Sheet for Patients:  BloggerCourse.com  Fact Sheet for Healthcare Providers:  SeriousBroker.it  This test is no t yet approved or cleared by the [place] FDA and  has been authorized for detection and/or diagnosis of SARS-CoV-2 by FDA under an Emergency Use Authorization (EUA). This EUA will remain  in effect (meaning this test can be used) for the duration of the COVID-19 declaration under Section 564(b)(1) of the Act, 21 U.S.C.section 360bbb-3(b)(1), unless the authorization is terminated  or revoked sooner.       Influenza A by PCR NEGATIVE NEGATIVE Final   Influenza B by PCR NEGATIVE NEGATIVE Final    Comment: (NOTE) The Xpert Xpress SARS-CoV-2/FLU/RSV plus assay is intended as an aid in the diagnosis of influenza from Nasopharyngeal swab specimens and should not be used as a sole basis for treatment. Nasal washings and aspirates are unacceptable for Xpert Xpress SARS-CoV-2/FLU/RSV testing.  Fact Sheet for Patients: BloggerCourse.com  Fact Sheet for  Healthcare Providers: SeriousBroker.it  This test is not yet approved or cleared by the [place] and has been authorized for detection and/or diagnosis of SARS-CoV-2 by FDA under an Emergency Use Authorization (EUA). This EUA will remain in effect (meaning this test can be used) for the duration of the COVID-19 declaration under Section 564(b)(1) of the Act, 21 U.S.C. section 360bbb-3(b)(1), unless the authorization is terminated or revoked.  Performed at [facility], [address] [place], [place] [address]   Urine Culture     Status: Abnormal   Collection Time: 02/07/22 12:23 AM   Specimen: In/Out Cath Urine  Result Value Ref Range Status   Specimen Description   Final    IN/OUT CATH URINE Performed at [facility], [address], [place], [place] [address]    Special Requests   Final    NONE Performed at [facility], [address], [place], [place] [address]    Culture (A)  Final    >=100,000 COLONIES/mL METHICILLIN RESISTANT STAPHYLOCOCCUS AUREUS   Report Status 02/09/2022 FINAL  Final   Organism ID, Bacteria METHICILLIN RESISTANT STAPHYLOCOCCUS AUREUS (A)  Final      Susceptibility   Methicillin resistant staphylococcus aureus - MIC*    CIPROFLOXACIN >=8 RESISTANT Resistant     GENTAMICIN <=0.5 SENSITIVE Sensitive     NITROFURANTOIN <=16 SENSITIVE Sensitive     OXACILLIN >=4 RESISTANT Resistant     TETRACYCLINE <=1 SENSITIVE Sensitive     VANCOMYCIN 1 SENSITIVE Sensitive     TRIMETH/SULFA >=320 RESISTANT Resistant     CLINDAMYCIN <=0.25 SENSITIVE Sensitive     RIFAMPIN <=0.5 SENSITIVE Sensitive     Inducible Clindamycin NEGATIVE Sensitive     * >=100,000 COLONIES/mL METHICILLIN RESISTANT STAPHYLOCOCCUS AUREUS  MRSA Next Gen by PCR, Nasal     Status: Abnormal   Collection Time: 02/07/22 12:43 AM   Specimen: Nasal Mucosa; Nasal Swab  Result Value Ref Range Status   MRSA by PCR Next Gen DETECTED (A) NOT DETECTED Final    Comment: CRITICAL RESULT CALLED TO, READ BACK BY AND VERIFIED WITH: CALLED TO ZAHOUREK,A AT [id number] ON 02/07/22 BY VAZQUEZ,J (NOTE) The GeneXpert MRSA Assay (FDA approved for NASAL specimens only), is one component of a comprehensive MRSA colonization surveillance program. It is not intended to diagnose MRSA infection nor to guide or monitor treatment for MRSA infections. Test performance is not FDA approved in patients less than 73 years old. Performed at [facility] [address], [place], [place] [address]   Culture, blood (single) w Reflex to ID Panel     Status: Abnormal   Collection Time: 02/07/22  6:58 AM   Specimen: BLOOD  Result Value Ref Range Status   Specimen Description   Final    BLOOD BLOOD LEFT WRIST Performed at [facility], [address], [place], [place] [address]    Special Requests   Final    IN PEDIATRIC BOTTLE Blood Culture adequate volume Performed at [facility], [address] [place], [place] [address]    Culture  Setup Time   Final    GRAM POSITIVE COCCI IN CLUSTERS IN PEDIATRIC BOTTLE CRITICAL VALUE NOTED.  VALUE IS CONSISTENT WITH PREVIOUSLY REPORTED AND CALLED VALUE.    Culture (A)  Final    STAPHYLOCOCCUS AUREUS SUSCEPTIBILITIES PERFORMED ON PREVIOUS CULTURE WITHIN THE LAST 5 DAYS. Performed at [facility], [address], [place], [place] [address]  Report Status 02/09/2022 FINAL  Final  Culture, blood (Routine X 2) w Reflex to ID Panel     Status: Abnormal (Preliminary result)   Collection Time: 02/08/22  2:58 AM   Specimen: BLOOD  Result Value Ref Range Status   Specimen Description   Final    BLOOD BLOOD LEFT HAND Performed at [facility], [address], [place], [place] [address]    Special Requests   Final    IN PEDIATRIC BOTTLE Blood Culture adequate volume Performed at [facility], [address], [place], [place] [address]    Culture  Setup Time   Final    GRAM POSITIVE COCCI IN CLUSTERS AEROBIC BOTTLE ONLY CRITICAL VALUE NOTED.  VALUE IS CONSISTENT WITH PREVIOUSLY REPORTED AND CALLED VALUE.    Culture (A)  Final    STAPHYLOCOCCUS AUREUS SUSCEPTIBILITIES PERFORMED ON PREVIOUS CULTURE WITHIN THE LAST 5 DAYS. Performed at [facility], [address] [place], [place] [address]    Report Status PENDING  Incomplete  Culture, blood (Routine X 2) w Reflex to ID Panel     Status:  Abnormal (Preliminary result)   Collection Time: 02/08/22  2:59 AM   Specimen: BLOOD  Result Value Ref Range Status   Specimen Description   Final    BLOOD RIGHT ANTECUBITAL Performed at [facility], [address], [place], [place] [address]    Special Requests   Final    IN PEDIATRIC BOTTLE Blood Culture adequate volume Performed at [facility], [address] [place], [place] [address]    Culture  Setup Time   Final    GRAM POSITIVE COCCI IN CLUSTERS AEROBIC BOTTLE ONLY CRITICAL VALUE NOTED.  VALUE IS CONSISTENT WITH PREVIOUSLY REPORTED AND CALLED VALUE.    Culture (A)  Final    STAPHYLOCOCCUS AUREUS SUSCEPTIBILITIES PERFORMED ON PREVIOUS CULTURE WITHIN THE LAST 5 DAYS. Performed at [facility], [address] [place], [place] [address]    Report Status PENDING  Incomplete    Antimicrobials: Anti-infectives (From admission, onward)    Start     Dose/Rate Route Frequency Ordered Stop   02/09/22 1400  ceftaroline (TEFLARO) 600 mg in sodium chloride 0.9 % 100 mL IVPB        600 mg 100 mL/hr over 60 Minutes Intravenous Every 8 hours 02/09/22 0955     02/09/22 1200  DAPTOmycin (CUBICIN) 500 mg in sodium chloride 0.9 % IVPB        8 mg/kg  61.2 kg 120 mL/hr over 30 Minutes Intravenous Daily 02/09/22 0955     02/07/22 2200  Vancomycin (VANCOCIN) 1,250 mg in sodium chloride 0.9 % 250 mL IVPB  Status:  Discontinued        1,250 mg 166.7 mL/hr over 90 Minutes Intravenous Every 24 hours 02/07/22 0434 02/07/22 1645   02/07/22 2200  vancomycin (VANCOREADY) IVPB 1250 mg/250 mL  Status:  Discontinued        1,250 mg 166.7 mL/hr over 90 Minutes Intravenous Every 24 hours 02/07/22 1645 02/09/22 0955   02/07/22 1000  metroNIDAZOLE (FLAGYL) IVPB 500 mg  Status:  Discontinued        500 mg 100 mL/hr over 60 Minutes Intravenous Every 12 hours 02/07/22 0427 02/07/22 1231   02/07/22 0600  ceFEPIme (MAXIPIME) 2 g in sodium chloride 0.9 % 100 mL  IVPB  Status:  Discontinued        2 g 200 mL/hr over 30 Minutes  Intravenous Every 8 hours 02/07/22 0434 02/07/22 1231   02/06/22 2130  metroNIDAZOLE (FLAGYL) IVPB 500 mg        500 mg 100 mL/hr over 60 Minutes Intravenous  Once 02/06/22 2121 02/06/22 2254   02/06/22 2130  ceFEPIme (MAXIPIME) 2 g in sodium chloride 0.9 % 100 mL IVPB        2 g 200 mL/hr over 30 Minutes Intravenous  Once 02/06/22 2126 02/06/22 2223   02/06/22 2130  vancomycin (VANCOCIN) IVPB 1000 mg/200 mL premix        1,000 mg 200 mL/hr over 60 Minutes Intravenous  Once 02/06/22 2126 02/06/22 2254      Culture/Microbiology    Component Value Date/Time   SDES  02/08/2022 0259    BLOOD RIGHT ANTECUBITAL Performed at [facility], [address], [place], [place] [address]    SPECREQUEST  02/08/2022 0259    IN PEDIATRIC BOTTLE Blood Culture adequate volume Performed at [facility], [address] [place], [place] [address]    CULT (A) 02/08/2022 0259    STAPHYLOCOCCUS AUREUS SUSCEPTIBILITIES PERFORMED ON PREVIOUS CULTURE WITHIN THE LAST 5 DAYS. Performed at [facility], [address], [place], [place] [address]    REPTSTATUS PENDING 02/08/2022 9147    Other culture-see note  Radiology Studies: CT Angio Chest Pulmonary Embolism (PE) W or WO Contrast  Result Date: 02/07/2022 CLINICAL DATA:  Sepsis and IV drug abuse. Fever and pleuritic chest pain. Left shoulder pain. EXAM: CT ANGIOGRAPHY CHEST WITH CONTRAST TECHNIQUE: Multidetector CT imaging of the chest was performed using the standard protocol during bolus administration of intravenous contrast. Multiplanar CT image reconstructions and MIPs were obtained to evaluate the vascular anatomy. RADIATION DOSE REDUCTION: This exam was performed according to the departmental dose-optimization program which includes automated exposure control, adjustment of the mA and/or kV according to patient size and/or use of iterative  reconstruction technique. CONTRAST:  80mL OMNIPAQUE IOHEXOL 350 MG/ML SOLN COMPARISON:  Chest radiograph 02/06/2022 FINDINGS: Cardiovascular: No filling defect is identified in the pulmonary arterial tree to suggest pulmonary embolus. Mediastinum/Nodes: Ill-defined left supraclavicular adenopathy up to 1.1 cm in diameter with hazy indistinctness of tissue planes in the left supraclavicular region. Left lower neck level V lymph node 0.6 cm in short axis on image 2 series 5. Left level IV lymph node 0.8 cm in short axis on image 7 series 5. Indistinctly marginated suspected thoracic adenopathy including a 1.8 cm in short axis right eccentric subcarinal node, 0.9 cm left infrahilar lymph node,, and a 0.9 cm in short axis AP window lymph node. Right hilar node 1.1 cm in short axis on image 42 series 5. Indistinct stranding in the mediastinal tissues. Lungs/Pleura: Bilateral scattered pulmonary nodules of various density along with rounded regions of bilateral airspace opacity measuring up to about 6 cm in diameter. Some of these have internal gas density such as the 2.9 by 1.9 cm left lower lobe airspace opacity on image 98 of series 6, potentially from mild early cavitation. Secondary pulmonary lobular septal thickening at the lung apices. Airway thickening is present, suggesting bronchitis or reactive airways disease. Trace left pleural effusion. Upper Abdomen: Splenomegaly. Musculoskeletal: Diffuse subcutaneous edema. Review of the MIP images confirms the above findings. IMPRESSION: 1. Scattered bilateral mixed density nodules and rounded airspace opacities scattered in both lungs, with potential mild cavitation in one of the left lower lobe airspace opacities, and with indistinct mediastinal and hilar adenopathy. Given the clinical context, the appearance  favors multifocal pneumonia and potentially septic emboli. However, no filling defect is identified in the pulmonary arterial tree to suggest bland pulmonary  arterial thrombus. Surveillance chest imaging recommended to ensure expected improvement with therapy. 2. Ill-defined left supraclavicular adenopathy and localized fat stranding slightly disproportionate to the diffuse subcutaneous edema. 3. Trace left pleural effusion. 4. Airway thickening is present, suggesting bronchitis or reactive airways disease. 5. Splenomegaly. Electronically Signed   By: Gaylyn Rong M.D.   On: 02/07/2022 11:49   MR CERVICAL SPINE WO CONTRAST  Result Date: 02/07/2022 CLINICAL DATA:  Neck pain with possible infection.  IV drug abuse. EXAM: MRI CERVICAL, THORACIC AND LUMBAR SPINE WITHOUT CONTRAST TECHNIQUE: Multiplanar and multiecho pulse sequences of the cervical spine, to include the craniocervical junction and cervicothoracic junction, and thoracic and lumbar spine, were obtained without intravenous contrast. COMPARISON:  None Available. FINDINGS: MRI CERVICAL SPINE FINDINGS Alignment: Physiologic. Vertebrae: No fracture, evidence of discitis, or bone lesion. Cord: Normal signal and morphology. Posterior Fossa, vertebral arteries, paraspinal tissues: Negative. Disc levels: No spinal canal or neural foraminal stenosis. MRI THORACIC SPINE FINDINGS Alignment:  Physiologic. Vertebrae: No fracture, evidence of discitis, or bone lesion. Cord:  Normal signal and morphology. Paraspinal and other soft tissues: There are multiple areas of consolidation within both lungs concerning for septic emboli. Disc levels: There is no spinal canal or neural foraminal stenosis. MRI LUMBAR SPINE FINDINGS Segmentation:  Standard Alignment:  Physiologic. Vertebrae:  No fracture, evidence of discitis, or bone lesion. Conus medullaris and cauda equina: Conus extends to the L1 level. Conus and cauda equina appear normal. Paraspinal and other soft tissues: Negative Disc levels: No spinal canal stenosis IMPRESSION: 1. Motion degraded examination. Additionally, postcontrast imaging could not be obtained due to  patient inability to tolerate the full length examination. 2. No discitis-osteomyelitis or epidural abscess. 3. Multiple areas of consolidation within both lungs concerning for septic emboli. Electronically Signed   By: Deatra Robinson M.D.   On: 02/07/2022 23:26   MR THORACIC SPINE WO CONTRAST  Result Date: 02/07/2022 CLINICAL DATA:  Neck pain with possible infection.  IV drug abuse. EXAM: MRI CERVICAL, THORACIC AND LUMBAR SPINE WITHOUT CONTRAST TECHNIQUE: Multiplanar and multiecho pulse sequences of the cervical spine, to include the craniocervical junction and cervicothoracic junction, and thoracic and lumbar spine, were obtained without intravenous contrast. COMPARISON:  None Available. FINDINGS: MRI CERVICAL SPINE FINDINGS Alignment: Physiologic. Vertebrae: No fracture, evidence of discitis, or bone lesion. Cord: Normal signal and morphology. Posterior Fossa, vertebral arteries, paraspinal tissues: Negative. Disc levels: No spinal canal or neural foraminal stenosis. MRI THORACIC SPINE FINDINGS Alignment:  Physiologic. Vertebrae: No fracture, evidence of discitis, or bone lesion. Cord:  Normal signal and morphology. Paraspinal and other soft tissues: There are multiple areas of consolidation within both lungs concerning for septic emboli. Disc levels: There is no spinal canal or neural foraminal stenosis. MRI LUMBAR SPINE FINDINGS Segmentation:  Standard Alignment:  Physiologic. Vertebrae:  No fracture, evidence of discitis, or bone lesion. Conus medullaris and cauda equina: Conus extends to the L1 level. Conus and cauda equina appear normal. Paraspinal and other soft tissues: Negative Disc levels: No spinal canal stenosis IMPRESSION: 1. Motion degraded examination. Additionally, postcontrast imaging could not be obtained due to patient inability to tolerate the full length examination. 2. No discitis-osteomyelitis or epidural abscess. 3. Multiple areas of consolidation within both lungs concerning for septic  emboli. Electronically Signed   By: Deatra Robinson M.D.   On: 02/07/2022 23:26   MR LUMBAR SPINE  WO CONTRAST  Result Date: 02/07/2022 CLINICAL DATA:  Neck pain with possible infection.  IV drug abuse. EXAM: MRI CERVICAL, THORACIC AND LUMBAR SPINE WITHOUT CONTRAST TECHNIQUE: Multiplanar and multiecho pulse sequences of the cervical spine, to include the craniocervical junction and cervicothoracic junction, and thoracic and lumbar spine, were obtained without intravenous contrast. COMPARISON:  None Available. FINDINGS: MRI CERVICAL SPINE FINDINGS Alignment: Physiologic. Vertebrae: No fracture, evidence of discitis, or bone lesion. Cord: Normal signal and morphology. Posterior Fossa, vertebral arteries, paraspinal tissues: Negative. Disc levels: No spinal canal or neural foraminal stenosis. MRI THORACIC SPINE FINDINGS Alignment:  Physiologic. Vertebrae: No fracture, evidence of discitis, or bone lesion. Cord:  Normal signal and morphology. Paraspinal and other soft tissues: There are multiple areas of consolidation within both lungs concerning for septic emboli. Disc levels: There is no spinal canal or neural foraminal stenosis. MRI LUMBAR SPINE FINDINGS Segmentation:  Standard Alignment:  Physiologic. Vertebrae:  No fracture, evidence of discitis, or bone lesion. Conus medullaris and cauda equina: Conus extends to the L1 level. Conus and cauda equina appear normal. Paraspinal and other soft tissues: Negative Disc levels: No spinal canal stenosis IMPRESSION: 1. Motion degraded examination. Additionally, postcontrast imaging could not be obtained due to patient inability to tolerate the full length examination. 2. No discitis-osteomyelitis or epidural abscess. 3. Multiple areas of consolidation within both lungs concerning for septic emboli. Electronically Signed   By: Deatra Robinson M.D.   On: 02/07/2022 23:26   MR SHOULDER LEFT W WO CONTRAST  Result Date: 02/08/2022 CLINICAL DATA:  IV drug use.  Concern for left  shoulder infection EXAM: MRI OF THE LEFT SHOULDER WITHOUT AND WITH CONTRAST TECHNIQUE: Multiplanar, multisequence MR imaging of the left shoulder was performed before and after the administration of intravenous contrast. CONTRAST:  5mL GADAVIST GADOBUTROL 1 MMOL/ML IV SOLN COMPARISON:  X-ray 02/06/2022 FINDINGS: Technical Note: Despite efforts by the technologist and patient, motion artifact is present on today's exam and could not be eliminated. This reduces exam sensitivity and specificity. Rotator cuff: The supraspinatus, infraspinatus, subscapularis, and teres minor tendons are intact. Muscles: Intramuscular edema within the proximal aspect of the anterior deltoid muscle adjacent to the acromion process. Preserved bulk and signal intensity of the rotator cuff musculature without edema, atrophy, or fatty infiltration. Biceps long head:  Intact and appropriately positioned. Acromioclavicular Joint: Small acromioclavicular joint effusion with peripheral enhancement and surrounding soft tissue edema. No significant fluid within the subacromial-subdeltoid bursal space. Glenohumeral Joint: No joint effusion. No cartilage defect. Labrum: Grossly intact although evaluation is limited in the absence of intra-articular fluid. No paralabral cyst. Bones: Mild subchondral bone marrow edema within the distal clavicle and adjacent acromion without a well-defined erosion. No definite marrow replacement. Distal clavicle is slightly elevated relative to the acromion without evidence of acute injury. Acromioclavicular ligaments appear thickened, but intact. No additional sites of bone marrow edema. No acute fracture or dislocation. Other: Diffuse soft tissue swelling about the shoulder with overlying subcutaneous edema and ill-defined fluid. No well-defined or rim enhancing fluid collections are seen at this time. Included portion of the left lung field demonstrates multifocal airspace consolidations compatible with known  pneumonia/septic emboli. IMPRESSION: 1. Small acromioclavicular joint effusion with peripheral enhancement and surrounding soft tissue edema. Findings are suspicious for septic arthritis in the setting of known systemic infection. 2. Mild subchondral bone marrow edema within the distal clavicle and adjacent acromion without a well-defined erosion. Findings may reflect reactive osteitis versus early acute osteomyelitis. 3. Diffuse soft tissue swelling about  the shoulder with ill-defined fluid. No well-defined or rim enhancing fluid collections seen at this time. 4. No evidence of septic arthritis of the glenohumeral joint. 5. Multifocal right lung consolidations compatible with known multifocal pneumonia/septic emboli. Electronically Signed   By: Duanne Guess D.O.   On: 02/08/2022 15:11   DG CHEST PORT 1 VIEW  Result Date: 02/08/2022 CLINICAL DATA:  Shortness of breath and fever. EXAM: PORTABLE CHEST 1 VIEW COMPARISON:  02/07/2022 CT and prior studies FINDINGS: Patchy ground-glass/airspace opacities are again noted likely representing infection/septic emboli as previously described. No definite new pulmonary opacities are present. No pleural effusion or pneumothorax. Cardiomediastinal silhouette is unchanged. No acute bony abnormalities are noted. IMPRESSION: Patchy ground-glass/airspace opacities again noted likely representing infection/septic emboli. Electronically Signed   By: Harmon Pier M.D.   On: 02/08/2022 15:01   ECHOCARDIOGRAM COMPLETE  Result Date: 02/07/2022    ECHOCARDIOGRAM REPORT   Patient Name:   JAYELLE PAGE Date of Exam: 02/07/2022 Medical Rec #:  [id number]         Height:       60.0 in Accession #:    [id number]        Weight:       114.6 lb Date of Birth:  Jan 22, 1991         BSA:          1.473 m Patient Age:    30 years          BP:           103/67 mmHg Patient Gender: F                 HR:           108 bpm. Exam Location:  Inpatient Procedure: 2D Echo, 3D Echo, Cardiac Doppler  and Color Doppler REPORT CONTAINS CRITICAL RESULT Indications:    Bacteremia  History:        Patient has no prior history of Echocardiogram examinations.                 Signs/Symptoms:Chest Pain; Risk Factors:Current Smoker. IVDU.  Sonographer:    Sheralyn Boatman RDCS Referring Phys: [id number] [facility]  Sonographer Comments: Technically difficult study due to poor echo windows. Patient supine with extreme pain in left shoulder. Attempted to move patient to the center of bed. Extremely difficult study due to reaching across bed. IMPRESSIONS  1. Left ventricular ejection fraction, by estimation, is 60 to 65%. The left ventricle has normal function. The left ventricle has no regional wall motion abnormalities. Left ventricular diastolic parameters were normal.  2. Right ventricular systolic function is normal. The right ventricular size is normal.  3. The mitral valve is normal in structure. No evidence of mitral valve regurgitation. No evidence of mitral stenosis.  4. Likely vegetation on the TV However appears on the ventricular side of the valve Suggest TEE to further evaluate Large mobile mass appers attached to the TV chords.  5. The aortic valve is normal in structure. Aortic valve regurgitation is not visualized. No aortic stenosis is present.  6. The inferior vena cava is normal in size with greater than 50% respiratory variability, suggesting right atrial pressure of 3 mmHg. FINDINGS  Left Ventricle: Left ventricular ejection fraction, by estimation, is 60 to 65%. The left ventricle has normal function. The left ventricle has no regional wall motion abnormalities. The left ventricular internal cavity size was normal in size. There is  no left ventricular hypertrophy. Left ventricular diastolic  parameters were normal. Right Ventricle: The right ventricular size is normal. No increase in right ventricular wall thickness. Right ventricular systolic function is normal. Left Atrium: Left atrial size was normal in  size. Right Atrium: Right atrial size was normal in size. Pericardium: There is no evidence of pericardial effusion. Mitral Valve: The mitral valve is normal in structure. No evidence of mitral valve regurgitation. No evidence of mitral valve stenosis. MV peak gradient, 10.2 mmHg. The mean mitral valve gradient is 4.0 mmHg. Tricuspid Valve: Likely vegetation on the TV However appears on the ventricular side of the valve Suggest TEE to further evaluate Large mobile mass appers attached to the TV chords. The tricuspid valve is normal in structure. Tricuspid valve regurgitation is mild . No evidence of tricuspid stenosis. Aortic Valve: The aortic valve is normal in structure. Aortic valve regurgitation is not visualized. No aortic stenosis is present. Pulmonic Valve: The pulmonic valve was normal in structure. Pulmonic valve regurgitation is not visualized. No evidence of pulmonic stenosis. Aorta: The aortic root is normal in size and structure. Venous: The inferior vena cava is normal in size with greater than 50% respiratory variability, suggesting right atrial pressure of 3 mmHg. IAS/Shunts: No atrial level shunt detected by color flow Doppler.  LEFT VENTRICLE PLAX 2D LVIDd:         4.30 cm     Diastology LVIDs:         2.80 cm     LV e' medial:    6.66 cm/s LV PW:         0.90 cm     LV E/e' medial:  17.9 LV IVS:        0.90 cm     LV e' lateral:   10.30 cm/s LVOT diam:     1.70 cm     LV E/e' lateral: 11.6 LV SV:         42 LV SV Index:   29 LVOT Area:     2.27 cm                             3D Volume EF: LV Volumes (MOD)           3D EF:        61 % LV vol d, MOD A2C: 61.3 ml LV EDV:       73 ml LV vol d, MOD A4C: 49.9 ml LV ESV:       28 ml LV vol s, MOD A2C: 19.7 ml LV SV:        44 ml LV vol s, MOD A4C: 17.9 ml LV SV MOD A2C:     41.6 ml LV SV MOD A4C:     49.9 ml LV SV MOD BP:      39.8 ml RIGHT VENTRICLE             IVC RV S prime:     13.00 cm/s  IVC diam: 2.00 cm TAPSE (M-mode): 1.9 cm LEFT ATRIUM              Index        RIGHT ATRIUM           Index LA diam:        2.70 cm 1.83 cm/m   RA Area:     10.00 cm LA Vol (A2C):   16.3 ml 11.06 ml/m  RA Volume:   22.00 ml  14.93 ml/m LA  Vol (A4C):   16.6 ml 11.27 ml/m LA Biplane Vol: 17.6 ml 11.94 ml/m  AORTIC VALVE LVOT Vmax:   138.00 cm/s LVOT Vmean:  90.000 cm/s LVOT VTI:    0.186 m  AORTA Ao Root diam: 2.80 cm Ao Asc diam:  2.90 cm MITRAL VALVE MV Area (PHT): 5.46 cm     SHUNTS MV Area VTI:   1.37 cm     Systemic VTI:  0.19 m MV Peak grad:  10.2 mmHg    Systemic Diam: 1.70 cm MV Mean grad:  4.0 mmHg MV Vmax:       1.60 m/s MV Vmean:      98.9 cm/s MV Decel Time: 139 msec MV E velocity: 119.00 cm/s MV A velocity: 109.50 cm/s MV E/A ratio:  1.09 Charlton Haws MD Electronically signed by Charlton Haws MD Signature Date/Time: 02/07/2022/6:15:21 PM    Final      LOS: 3 days   Lanae Boast, MD [facility]  02/09/2022, 10:55 AM

## 2022-02-09 NOTE — Progress Notes (Signed)
NAME:  Carol Rivera, MRN:  [id number], DOB:  November 08, 1990, LOS: 3 ADMISSION DATE:  02/06/2022, CONSULTATION DATE:  02/08/22 REFERRING MD:  Glade Lloyd, MD CHIEF COMPLAINT:  Endocarditis   History of Present Illness:  Carol Rivera is a 31 year old woman with history of IV drug use who was admitted 02/06/22 with fever, left shoulder pain and general malaise for a few days prior to admission. 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 She has septic pulmonary emboli noted on CT Chest scan 02/07/22. She has MRI of left shoulder pending and MRI Spine without obvious abscess/phlegmon.   PCCM consulted today for increasing respiratory rate and altered mentation.   Patient sitting up in bed taking oral medications at time of evaluation. She complains of diffuse chest and back pain along with left shoulder pain.   Pertinent  Medical History  Asthma IV drug use   Significant Hospital Events: Including procedures, antibiotic start and stop dates in addition to other pertinent events   5/18 admitted to hospital for MRSA endocarditis/bacteremia 5/20 PCCM consulted for evaluation, and did not require ICU level care so CCM signed off. Moved to [facility], on [facility] service  5/21 PCCM consulted again   Interim History / Subjective:  Consulted for tachycardia   Crying in pain   Objective   Blood pressure 110/73, pulse (!) 125, temperature 98.4 F (36.9 C), temperature source Oral, resp. rate (!) 25, height 5' (1.524 m), weight 61.2 kg, SpO2 100 %.        Intake/Output Summary (Last 24 hours) at 02/09/2022 1130 Last data filed at 02/08/2022 1500 Gross per 24 hour  Intake 48.42 ml  Output --  Net 48.42 ml    Filed Weights   02/06/22 1933 02/07/22 0649 02/09/22 0317  Weight: 52.2 kg 52 kg 61.2 kg   Examination: General: ill appearing young adult M in distress  HENT: NCAT pink mm.  Lungs: increased RR. Symmetrical chest expansion.  Faint wheeze.  Cardiovascular: tachycardic, regular  Abdomen: soft ndnt. Mild generalized tenderness  Extremities: R AC PIV partially dislodged.  Neuro: Awake following commands  GU: n/a  Resolved Hospital Problem list     Assessment & Plan:   PCCM consulted initially 5/20 at which time ICU care was not indicated. Re-consulted 5/21 for re-evaluation of sepsis with increased tachycardia  Sepsis due to MRSA endocarditis/bacteremia, with mobile tricuspid valve mass Septic arthritis L shoulder  Possible early osteomyelitis  Septic pulmonary emboli Tachycardia Tachypnea  Opiate abuse disorder Acutely uncontrolled pain -on arrival patient with apparent uncontrolled pain. Chart review with PRN opiates. Pt reports daily heroin use of 500mg  to 1g. Suspect pain is contributing to worse tachycardia  -acutely, could be developing pleural effusion which could be source of add'l pain, as could pericarditis  P - off [place] -- 100% on RA -changing PRN oxy to 10mg  oxy SCH q6. Cont PRN IV dilaudid. Might benefit from methadone -will order etco2 - assessing possible hypoventilation w/ opiates -cont ceftaroline and dapto per ICD -CXR to eval possible pleural effusion  -TEE  -if pericardial effusion, colchicine  -ortho consult is pending RE septic arthritis  -pccm will plan to re-evaluation patient 5/21 afternoon after pain has been better controlled   Tobacco use -1ppd P -add nicotine patch,  -adding albuterol neb   Hypomagnesemia Mild hyponatremia -2g mag 5/21 -would recheck in AM - -goal >2   Elevated LFTs Hyperbilirubinemia  - PRN LFTs  Malpositioned PIV -I removed R AC PIV. RN aware -has PIV in hand, would consider getting 2nd PIV    Best Practice (right click and "Reselect all SmartList Selections" daily)   Per primary   Labs   CBC: Recent Labs  Lab 02/06/22 2020 02/07/22 0658 02/08/22 0304 02/09/22 0340  WBC 17.9* 13.1* 17.0* 20.5*  NEUTROABS 14.9*  --   --   --    HGB 12.2 10.7* 12.4 10.6*  HCT 37.0 32.9* 36.5 32.7*  MCV 76.1* 76.3* 74.8* 74.7*  PLT 187 126* 126* 149*     Basic Metabolic Panel: Recent Labs  Lab 02/06/22 2020 02/07/22 0658 02/08/22 0304 02/09/22 0340  NA 128* 130* 134* 133*  K 4.8 3.4* 4.1 3.7  CL 92* 98 101 101  CO2 24 23 26 24   GLUCOSE 118* 103* 94 108*  BUN 12 12 10 12   CREATININE 0.55 0.63 0.53 0.67  CALCIUM 8.3* 8.2* 8.0* 7.7*  MG  --   --  1.4*  --   PHOS  --   --  2.4*  --     GFR: Estimated Creatinine Clearance: 83.3 mL/min (by C-G formula based on SCr of 0.67 mg/dL). Recent Labs  Lab 02/06/22 2020 02/07/22 0023 02/07/22 0340 02/07/22 0658 02/07/22 1429 02/07/22 1722 02/08/22 0304 02/08/22 0848 02/09/22 0340  PROCALCITON  --   --   --  16.71  --   --   --  14.88 11.87  WBC 17.9*  --   --  13.1*  --   --  17.0*  --  20.5*  LATICACIDVEN  --    < > >9.0* 2.8* 2.9* 2.9*  --   --   --    < > = values in this interval not displayed.     Liver Function Tests: Recent Labs  Lab 02/06/22 2020 02/07/22 0658 02/08/22 0304 02/09/22 0340  AST 257* 144* 112* 62*  ALT 454* 293* 213* 125*  ALKPHOS 163* 111 109 125  BILITOT 6.9* 6.7* 5.4* 5.7*  PROT 7.8 6.0* 5.7* 5.7*  ALBUMIN 3.0* 2.3* 2.0* 1.6*    No results for input(s): LIPASE, AMYLASE in the last 168 hours. No results for input(s): AMMONIA in the last 168 hours.  ABG    Component Value Date/Time   PHART 7.5 (H) 02/08/2022 1412   PCO2ART 38 02/08/2022 1412   PO2ART 104 02/08/2022 1412   HCO3 29.2 (H) 02/08/2022 1412   O2SAT 99.4 02/08/2022 1412     Coagulation Profile: Recent Labs  Lab 02/06/22 2020  INR 1.3*     Cardiac Enzymes: Recent Labs  Lab 02/09/22 1028  CKTOTAL 14*    HbA1C: No results found for: HGBA1C  CBG: No results for input(s): GLUCAP in the last 168 hours.  CCT: n/a    02/08/22 MSN, AGACNP-BC [facility] Pulmonary/Critical Care Medicine Amion for pager  02/09/2022, 12:24 PM

## 2022-02-09 NOTE — Progress Notes (Signed)
Chart reviewed MRI scan reviewed Left shoulder AC joint likely septic arthritis due to MRSA endocarditis from IV drug use Had Lovenox this morning at 10 AM Plan is open excisional debridement of the [facility] joint with probable distal clavicle excision.  I will see the patient in preop holding tomorrow.  Tentatively have the surgery planned for 10:00 in the morning.  N.p.o. after midnight and hold Lovenox.

## 2022-02-09 NOTE — Progress Notes (Addendum)
  Re-eval this afternoon after below change, seen with Dr. Chestine Spore  Several reasons for ongoing tachycardia -- sepsis, withdrawals, opiate dependence with uncontrolled pain. Some interval improvement from earlier this afternoon.  Stable to remain on SDU at this time  Rec EtCO2 monitor  Will order a low dose metop  Cont pain management. This can be managed per primary team. She will have significant opiate need based on use hx of 1g heroin/d    No critical care needs at this time   Tessie Fass MSN, AGACNP-BC [facility] Amion for pager  02/09/2022, 3:46 PM  ____    Re-evaluated pt after [facility] oxy this morning, d/w RN  Difficult to catch up on pain, pt does not notify about breakthrough pain, which makes managing difficult. Definitely a high nursing need.  Some improvement with pain (now 8/10 from 10/10), and is a bit more comfortable appearing after recent [facility] oxy admin.  Remains tachycardic, again think this is multifactorial with significant opiate dependence + poorly controlled pain as well as sepsis.   Less tachypnea though still incr RR.  SBP 120s   I incr PRN anxiolysis from 0.5mg  to 1mg  xanax.  Cont analgesic reg -- will dw ccm md re adding methadone, which I think would help with some longer acting coverage. Would not be surprised if current [facility] and PRN reg needs to be escalated given use hx  Will give a 1x metop to help tachycardia   D/w RN -- for now comfortable to remain on progressive, but if this changes please let me know.    Will plan to see again this afternoon in follow up, but happy to come back sooner if needed.
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
## 2022-02-09 NOTE — Hospital Course (Addendum)
31 y.o. F W/ history significant for childhood asthma and intravenous drug abuse presented with fever, chest pain, cough and left shoulder pain. In ED-febrile, tachycardic.  Chest x-ray and left shoulder x-ray was negative for acute findings.  Sodium 128, alkaline phosphatase 163, AST 257, ALT 454, total bilirubin of 6.9 with WBCs of 17,900 and lactic acid of 4.  COVID-19 and influenza testing negative.  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  MRI of the spine without obvious abscess/phlegmon or osseous involvement.subsequently transferred from [facility] to [facility] 5/22 to consider TEE and CT surgery evaluation.

## 2022-02-10 ENCOUNTER — Inpatient Hospital Stay (HOSPITAL_COMMUNITY): Payer: Medicaid Other

## 2022-02-10 ENCOUNTER — Other Ambulatory Visit: Payer: Self-pay

## 2022-02-10 ENCOUNTER — Encounter (HOSPITAL_COMMUNITY): Payer: Self-pay | Admitting: Family Medicine

## 2022-02-10 ENCOUNTER — Encounter (HOSPITAL_COMMUNITY)
Admission: EM | Discharge status: Against Medical Advice | Payer: Self-pay | Source: Home / Self Care | Attending: Internal Medicine

## 2022-02-10 ENCOUNTER — Inpatient Hospital Stay (HOSPITAL_COMMUNITY): Payer: Medicaid Other | Admitting: Certified Registered"

## 2022-02-10 DIAGNOSIS — R7881 Bacteremia: Secondary | ICD-10-CM | POA: Diagnosis not present

## 2022-02-10 DIAGNOSIS — R4182 Altered mental status, unspecified: Secondary | ICD-10-CM

## 2022-02-10 DIAGNOSIS — M009 Pyogenic arthritis, unspecified: Secondary | ICD-10-CM

## 2022-02-10 DIAGNOSIS — A419 Sepsis, unspecified organism: Secondary | ICD-10-CM | POA: Diagnosis not present

## 2022-02-10 DIAGNOSIS — R6521 Severe sepsis with septic shock: Secondary | ICD-10-CM | POA: Diagnosis not present

## 2022-02-10 DIAGNOSIS — B9562 Methicillin resistant Staphylococcus aureus infection as the cause of diseases classified elsewhere: Secondary | ICD-10-CM | POA: Diagnosis not present

## 2022-02-10 DIAGNOSIS — K7200 Acute and subacute hepatic failure without coma: Secondary | ICD-10-CM | POA: Diagnosis not present

## 2022-02-10 HISTORY — PX: IRRIGATION AND DEBRIDEMENT SHOULDER: SHX5880

## 2022-02-10 LAB — COMPREHENSIVE METABOLIC PANEL
ALT: 69 U/L — ABNORMAL HIGH (ref 0–44)
AST: 45 U/L — ABNORMAL HIGH (ref 15–41)
Albumin: <1.5 g/dL — ABNORMAL LOW (ref 3.5–5.0)
Alkaline Phosphatase: 186 U/L — ABNORMAL HIGH (ref 38–126)
Anion gap: 10 (ref 5–15)
BUN: 12 mg/dL (ref 6–20)
CO2: 20 mmol/L — ABNORMAL LOW (ref 22–32)
Calcium: 7.2 mg/dL — ABNORMAL LOW (ref 8.9–10.3)
Chloride: 104 mmol/L (ref 98–111)
Creatinine, Ser: 0.62 mg/dL (ref 0.44–1.00)
GFR, Estimated: >60 mL/min (ref >60)
Glucose, Bld: 55 mg/dL — ABNORMAL LOW (ref 70–99)
Potassium: 3.5 mmol/L (ref 3.5–5.1)
Sodium: 134 mmol/L — ABNORMAL LOW (ref 135–145)
Total Bilirubin: 6.3 mg/dL — ABNORMAL HIGH (ref 0.3–1.2)
Total Protein: 5.6 g/dL — ABNORMAL LOW (ref 6.5–8.1)

## 2022-02-10 LAB — CULTURE, BLOOD (ROUTINE X 2): Special Requests: ADEQUATE

## 2022-02-10 LAB — PHOSPHORUS: Phosphorus: 4.1 mg/dL (ref 2.5–4.6)

## 2022-02-10 LAB — GLUCOSE, CAPILLARY: Glucose-Capillary: 105 mg/dL — ABNORMAL HIGH (ref 70–99)

## 2022-02-10 LAB — GC/CHLAMYDIA PROBE AMP (~~LOC~~) NOT AT ARMC
Chlamydia: NEGATIVE
Comment: NEGATIVE
Comment: NORMAL
Neisseria Gonorrhea: NEGATIVE

## 2022-02-10 LAB — SURGICAL PCR SCREEN
MRSA, PCR: POSITIVE — AB
Staphylococcus aureus: POSITIVE — AB

## 2022-02-10 LAB — MAGNESIUM: Magnesium: 2 mg/dL (ref 1.7–2.4)

## 2022-02-10 LAB — HCV RNA QUANT

## 2022-02-10 IMAGING — CT CT HEAD W/O CM
4 series · 16 of 47 positions shown, 18 images · non-contrast
Comparison: [DATE]

CLINICAL DATA: Mental status change



[Series 3: head without · axial · non-contrast · 0.42mm/px · z∈[-90,+30]mm · 7 of 32 slices shown, 9 images]
[im 4/32  brain]
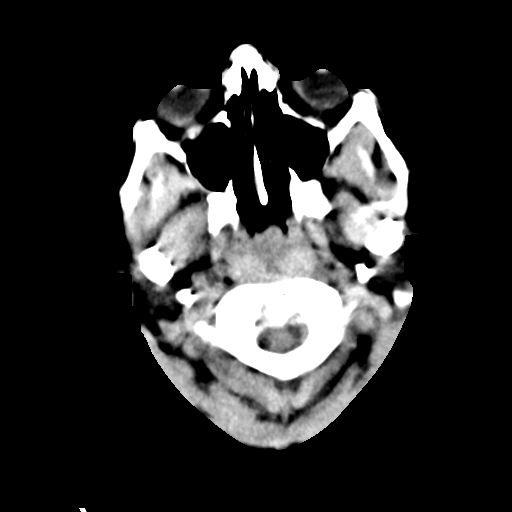
[im 4/32  bone]
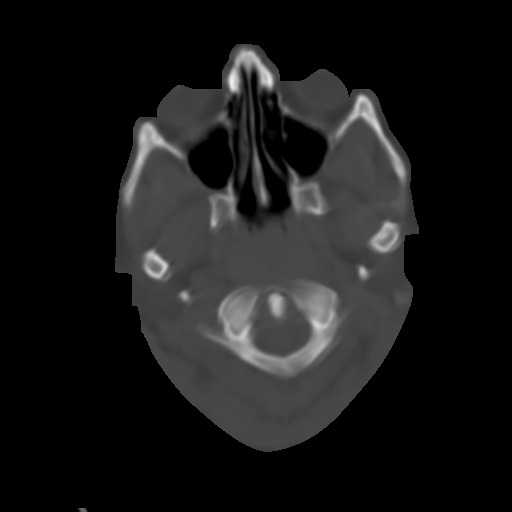
[im 8/32  brain]
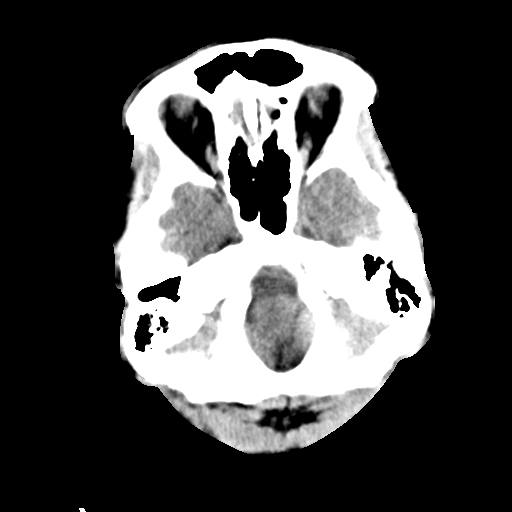
[im 12/32  brain]
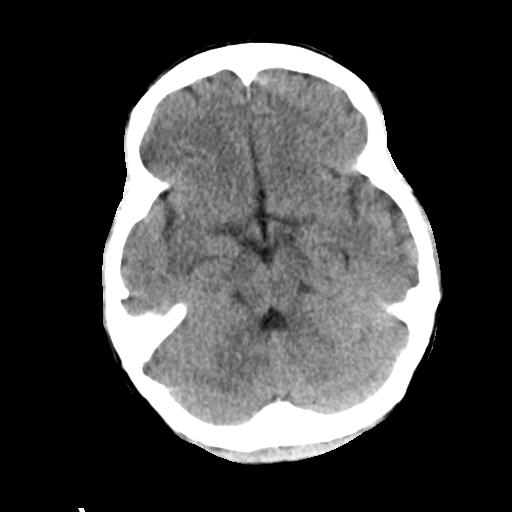
[im 16/32  brain]
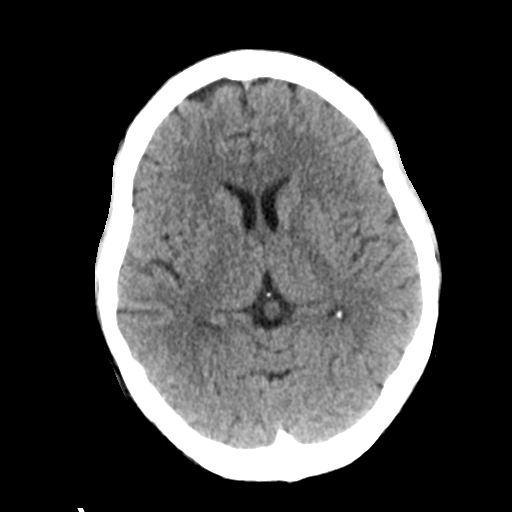
[im 20/32  brain]
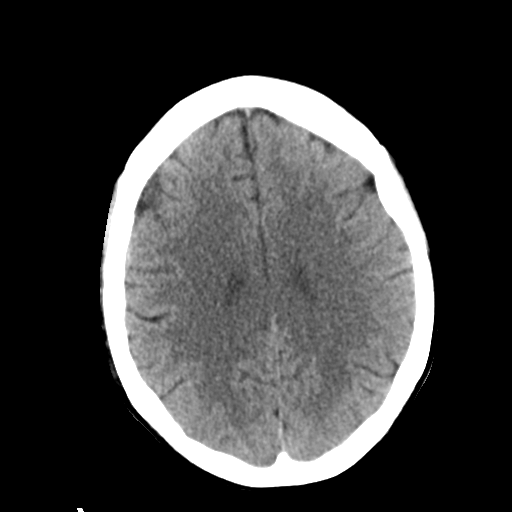
[im 20/32  bone]
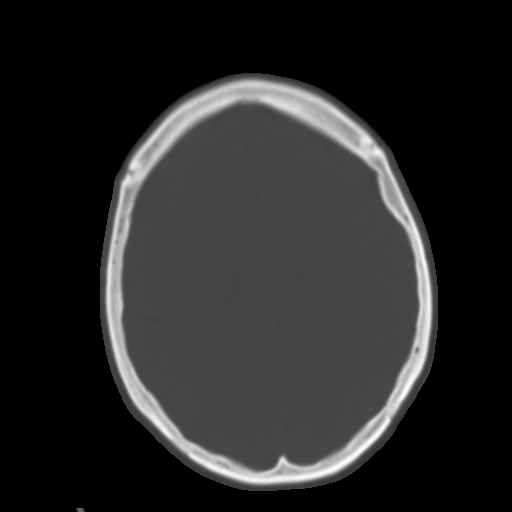
[im 24/32  brain]
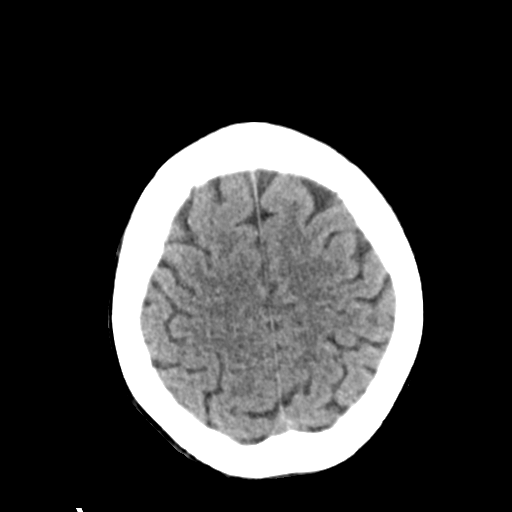
[im 28/32  brain]
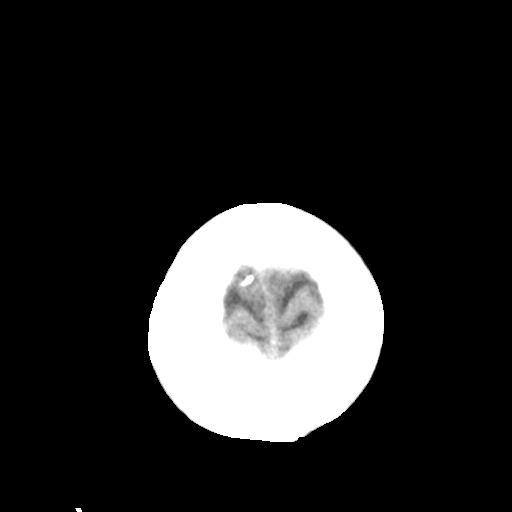

[Series 4: head bone · axial · 0.42mm/px · z∈[-91,-59]mm · 3 of 80 slices shown]
[im 8/80  bone]
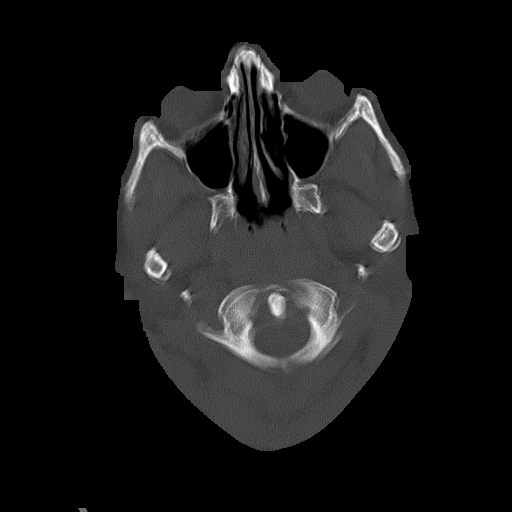
[im 16/80  bone]
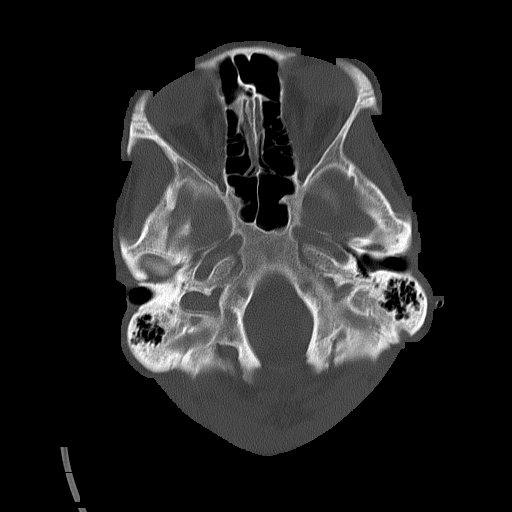
[im 24/80  bone]
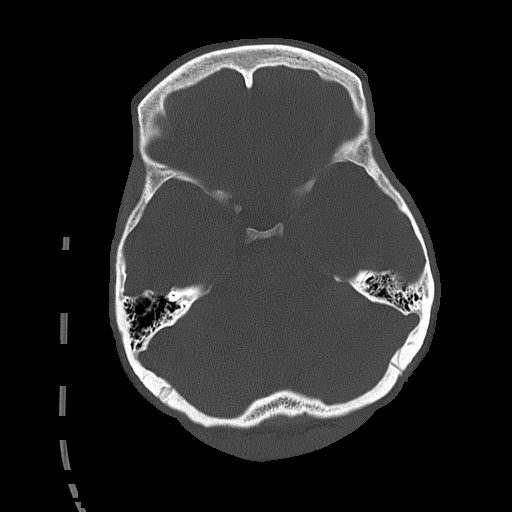

[Series 5: head without cor · coronal · non-contrast · 0.32mm/px · 3 of 72 slices shown]
[im 24/72  brain]
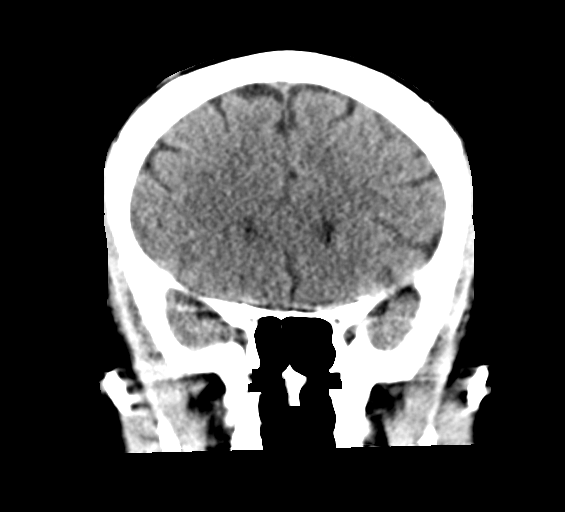
[im 32/72  brain]
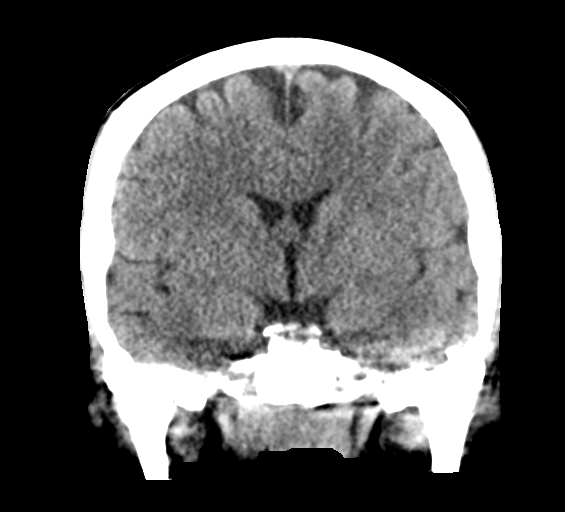
[im 40/72  brain]
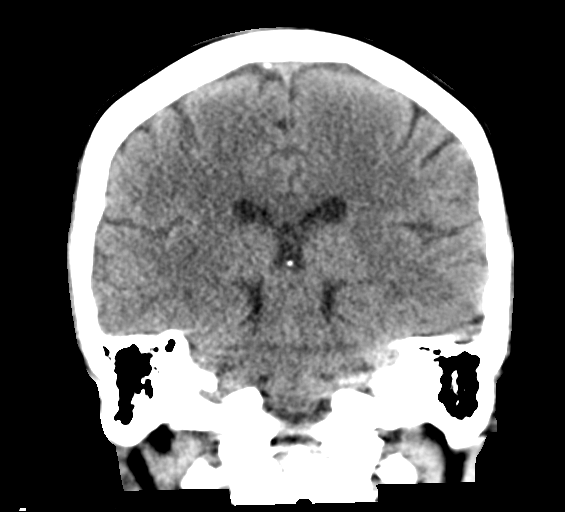

[Series 6: head without sag · sagittal · non-contrast · 0.33mm/px · 3 of 57 slices shown]
[im 19/57  brain]
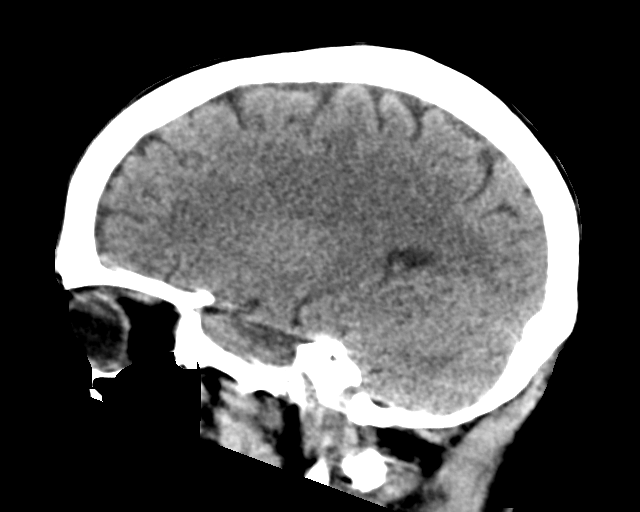
[im 29/57  brain]
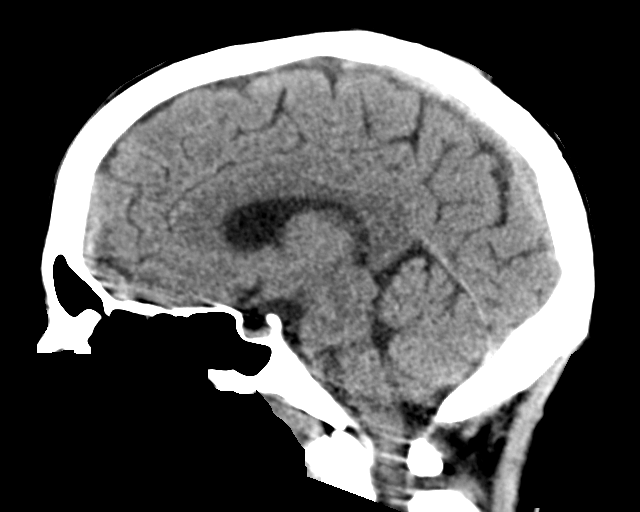
[im 38/57  brain]
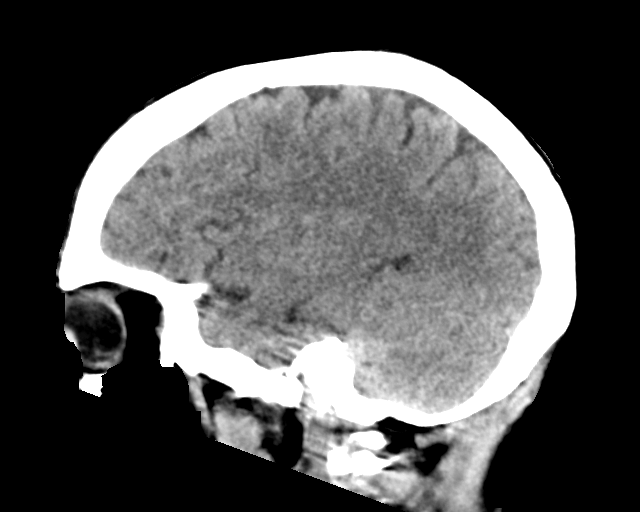

[16 of 47 positions shown; findings below may reference images not displayed]

FINDINGS: Brain: No evidence of acute infarction, hemorrhage, hydrocephalus,
extra-axial collection or mass lesion/mass effect.

Vascular: No hyperdense vessel or unexpected calcification.

Skull: No osseous abnormality.

Sinuses/Orbits: Visualized paranasal sinuses are clear. Visualized
mastoid sinuses are clear. Visualized orbits demonstrate no focal
abnormality.

Other: None
IMPRESSION: 1. No acute intracranial findings.

## 2022-02-10 SURGERY — IRRIGATION AND DEBRIDEMENT SHOULDER
Anesthesia: General | Site: Shoulder | Laterality: Left

## 2022-02-10 MED ORDER — ALBUMIN HUMAN 5 % IV SOLN
INTRAVENOUS | Status: DC | PRN
Start: 2022-02-10 — End: 2022-02-10

## 2022-02-10 MED ORDER — PHENYLEPHRINE HCL-NACL 20-0.9 MG/250ML-% IV SOLN
INTRAVENOUS | Status: DC | PRN
  Administered 2022-02-10: 40 ug/min via INTRAVENOUS

## 2022-02-10 MED ORDER — SODIUM CHLORIDE 0.9 % IV SOLN: INTRAVENOUS | Status: DC

## 2022-02-10 MED ORDER — QUETIAPINE FUMARATE 50 MG PO TABS
50.0000 mg | ORAL_TABLET | Freq: Two times a day (BID) | ORAL | Status: DC
  Administered 2022-02-10: 50 mg via ORAL
  Filled 2022-02-10: qty 1

## 2022-02-10 MED ORDER — PHENYLEPHRINE HCL (PRESSORS) 10 MG/ML IV SOLN
INTRAVENOUS | Status: AC
  Filled 2022-02-10: qty 1

## 2022-02-10 MED ORDER — METHOCARBAMOL 1000 MG/10ML IJ SOLN
500.0000 mg | Freq: Four times a day (QID) | INTRAVENOUS | Status: DC | PRN
  Filled 2022-02-10: qty 5

## 2022-02-10 MED ORDER — BUPIVACAINE HCL (PF) 0.25 % IJ SOLN
INTRAMUSCULAR | Status: DC | PRN
  Administered 2022-02-10: 30 mL

## 2022-02-10 MED ORDER — CHLORHEXIDINE GLUCONATE 0.12 % MT SOLN: 15.0000 mL | Freq: Once | OROMUCOSAL | Status: DC

## 2022-02-10 MED ORDER — NALOXONE HCL 0.4 MG/ML IJ SOLN
0.4000 mg | Freq: Once | INTRAMUSCULAR | Status: AC
  Administered 2022-02-10: 0.4 mg via INTRAVENOUS
  Filled 2022-02-10: qty 1

## 2022-02-10 MED ORDER — METOCLOPRAMIDE HCL 5 MG/ML IJ SOLN
5.0000 mg | Freq: Three times a day (TID) | INTRAMUSCULAR | Status: DC | PRN
  Administered 2022-02-17: 10 mg via INTRAVENOUS
  Administered 2022-02-21: 5 mg via INTRAVENOUS
  Filled 2022-02-10 (×2): qty 2

## 2022-02-10 MED ORDER — OXYCODONE HCL 5 MG PO TABS
5.0000 mg | ORAL_TABLET | ORAL | Status: DC | PRN
  Administered 2022-02-11 – 2022-02-14 (×3): 10 mg via ORAL
  Administered 2022-02-15: 5 mg via ORAL
  Administered 2022-02-16 – 2022-02-21 (×13): 10 mg via ORAL
  Filled 2022-02-10 (×21): qty 2

## 2022-02-10 MED ORDER — VANCOMYCIN HCL 1000 MG IV SOLR
INTRAVENOUS | Status: AC
  Filled 2022-02-10: qty 20

## 2022-02-10 MED ORDER — MIDAZOLAM HCL 2 MG/2ML IJ SOLN
INTRAMUSCULAR | Status: AC
  Filled 2022-02-10: qty 2

## 2022-02-10 MED ORDER — ONDANSETRON HCL 4 MG/2ML IJ SOLN
4.0000 mg | Freq: Four times a day (QID) | INTRAMUSCULAR | Status: DC | PRN
  Administered 2022-02-15: 4 mg via INTRAVENOUS

## 2022-02-10 MED ORDER — PHENYLEPHRINE 80 MCG/ML (10ML) SYRINGE FOR IV PUSH (FOR BLOOD PRESSURE SUPPORT)
PREFILLED_SYRINGE | INTRAVENOUS | Status: DC | PRN
  Administered 2022-02-10 (×2): 80 ug via INTRAVENOUS

## 2022-02-10 MED ORDER — METOCLOPRAMIDE HCL 5 MG PO TABS: 5.0000 mg | ORAL_TABLET | Freq: Three times a day (TID) | ORAL | Status: DC | PRN

## 2022-02-10 MED ORDER — HYDROMORPHONE HCL 1 MG/ML IJ SOLN
0.5000 mg | INTRAMUSCULAR | Status: DC | PRN
  Administered 2022-02-11 – 2022-02-19 (×40): 0.5 mg via INTRAVENOUS
  Filled 2022-02-10 (×42): qty 0.5

## 2022-02-10 MED ORDER — ROCURONIUM BROMIDE 10 MG/ML (PF) SYRINGE
PREFILLED_SYRINGE | INTRAVENOUS | Status: DC | PRN
  Administered 2022-02-10: 40 mg via INTRAVENOUS

## 2022-02-10 MED ORDER — METHOCARBAMOL 500 MG PO TABS
500.0000 mg | ORAL_TABLET | Freq: Four times a day (QID) | ORAL | Status: DC | PRN
  Administered 2022-02-13 – 2022-02-25 (×9): 500 mg via ORAL
  Filled 2022-02-10 (×10): qty 1

## 2022-02-10 MED ORDER — HYDROMORPHONE HCL 1 MG/ML IJ SOLN: 0.5000 mg | INTRAMUSCULAR | Status: DC | PRN

## 2022-02-10 MED ORDER — ACETAMINOPHEN 325 MG PO TABS: 325.0000 mg | ORAL_TABLET | Freq: Four times a day (QID) | ORAL | Status: DC | PRN

## 2022-02-10 MED ORDER — ALPRAZOLAM 0.5 MG PO TABS
0.5000 mg | ORAL_TABLET | Freq: Three times a day (TID) | ORAL | Status: DC | PRN
  Administered 2022-02-12 – 2022-02-26 (×12): 0.5 mg via ORAL
  Filled 2022-02-10 (×12): qty 1

## 2022-02-10 MED ORDER — ACETAMINOPHEN 500 MG PO TABS
1000.0000 mg | ORAL_TABLET | Freq: Four times a day (QID) | ORAL | Status: AC
  Administered 2022-02-11: 1000 mg via ORAL
  Filled 2022-02-10: qty 2

## 2022-02-10 MED ORDER — FLUMAZENIL 0.5 MG/5ML IV SOLN
INTRAVENOUS | Status: AC
  Administered 2022-02-10: 0.2 mg
  Filled 2022-02-10: qty 5

## 2022-02-10 MED ORDER — CLONIDINE HCL (ANALGESIA) 100 MCG/ML EP SOLN
EPIDURAL | Status: AC
  Filled 2022-02-10: qty 10

## 2022-02-10 MED ORDER — LACTATED RINGERS IV SOLN: INTRAVENOUS | Status: DC | PRN

## 2022-02-10 MED ORDER — POTASSIUM CHLORIDE 10 MEQ/100ML IV SOLN
10.0000 meq | INTRAVENOUS | Status: AC
  Administered 2022-02-10 (×2): 10 meq via INTRAVENOUS
  Filled 2022-02-10 (×2): qty 100

## 2022-02-10 MED ORDER — LIDOCAINE 2% (20 MG/ML) 5 ML SYRINGE
INTRAMUSCULAR | Status: DC | PRN
  Administered 2022-02-10: 40 mg via INTRAVENOUS

## 2022-02-10 MED ORDER — ONDANSETRON HCL 4 MG PO TABS
4.0000 mg | ORAL_TABLET | Freq: Four times a day (QID) | ORAL | Status: DC | PRN
Start: 2022-02-10 — End: 2022-02-27
  Administered 2022-02-20: 4 mg via ORAL
  Filled 2022-02-10: qty 1

## 2022-02-10 MED ORDER — POTASSIUM CHLORIDE CRYS ER 10 MEQ PO TBCR: 30.0000 meq | EXTENDED_RELEASE_TABLET | Freq: Four times a day (QID) | ORAL | Status: DC

## 2022-02-10 MED ORDER — NALOXONE HCL 0.4 MG/ML IJ SOLN
INTRAMUSCULAR | Status: AC
  Administered 2022-02-10: 0.2 mg
  Filled 2022-02-10: qty 1

## 2022-02-10 MED ORDER — CHLORHEXIDINE GLUCONATE 4 % EX LIQD
60.0000 mL | Freq: Once | CUTANEOUS | Status: AC
  Administered 2022-02-10: 4 via TOPICAL
  Filled 2022-02-10: qty 60

## 2022-02-10 MED ORDER — ALBUMIN HUMAN 5 % IV SOLN: INTRAVENOUS | Status: DC | PRN

## 2022-02-10 MED ORDER — ONDANSETRON HCL 4 MG/2ML IJ SOLN
INTRAMUSCULAR | Status: DC | PRN
  Administered 2022-02-10: 4 mg via INTRAVENOUS

## 2022-02-10 MED ORDER — CHLORHEXIDINE GLUCONATE 0.12 % MT SOLN
OROMUCOSAL | Status: AC
  Filled 2022-02-10: qty 15

## 2022-02-10 MED ORDER — PROPOFOL 10 MG/ML IV BOLUS
INTRAVENOUS | Status: AC
  Filled 2022-02-10: qty 20

## 2022-02-10 MED ORDER — KETOROLAC TROMETHAMINE 30 MG/ML IJ SOLN
30.0000 mg | Freq: Three times a day (TID) | INTRAMUSCULAR | Status: DC
Start: 2022-02-10 — End: 2022-02-13
  Administered 2022-02-10 – 2022-02-13 (×8): 30 mg via INTRAVENOUS
  Filled 2022-02-10 (×8): qty 1

## 2022-02-10 MED ORDER — ACETAMINOPHEN 325 MG PO TABS: 650.0000 mg | ORAL_TABLET | Freq: Four times a day (QID) | ORAL | Status: DC | PRN

## 2022-02-10 MED ORDER — LACTATED RINGERS IV SOLN: INTRAVENOUS | Status: DC

## 2022-02-10 MED ORDER — FENTANYL CITRATE (PF) 250 MCG/5ML IJ SOLN
INTRAMUSCULAR | Status: AC
  Filled 2022-02-10: qty 5

## 2022-02-10 MED ORDER — MORPHINE SULFATE (PF) 4 MG/ML IV SOLN
INTRAVENOUS | Status: DC | PRN
Start: 2022-02-10 — End: 2022-02-10
  Administered 2022-02-10: 8 mg via SUBCUTANEOUS

## 2022-02-10 MED ORDER — SUGAMMADEX SODIUM 200 MG/2ML IV SOLN
INTRAVENOUS | Status: DC | PRN
  Administered 2022-02-10: 200 mg via INTRAVENOUS
  Administered 2022-02-10: 50 mg via INTRAVENOUS

## 2022-02-10 MED ORDER — BUPIVACAINE HCL (PF) 0.25 % IJ SOLN
INTRAMUSCULAR | Status: AC
  Filled 2022-02-10: qty 30

## 2022-02-10 MED ORDER — MORPHINE SULFATE (PF) 4 MG/ML IV SOLN
INTRAVENOUS | Status: AC
  Filled 2022-02-10: qty 2

## 2022-02-10 MED ORDER — CLONAZEPAM 1 MG PO TABS
1.0000 mg | ORAL_TABLET | Freq: Two times a day (BID) | ORAL | Status: DC
  Administered 2022-02-10: 1 mg via ORAL
  Filled 2022-02-10 (×2): qty 1

## 2022-02-10 MED ORDER — ORAL CARE MOUTH RINSE: 15.0000 mL | Freq: Once | OROMUCOSAL | Status: DC

## 2022-02-10 MED ORDER — POTASSIUM CHLORIDE CRYS ER 20 MEQ PO TBCR: 30.0000 meq | EXTENDED_RELEASE_TABLET | Freq: Once | ORAL | Status: DC

## 2022-02-10 MED ORDER — POVIDONE-IODINE 10 % EX SWAB: 2.0000 "application " | Freq: Once | CUTANEOUS | Status: DC

## 2022-02-10 MED ORDER — SUCCINYLCHOLINE CHLORIDE 200 MG/10ML IV SOSY
PREFILLED_SYRINGE | INTRAVENOUS | Status: DC | PRN
  Administered 2022-02-10: 100 mg via INTRAVENOUS

## 2022-02-10 MED ORDER — DOCUSATE SODIUM 100 MG PO CAPS
100.0000 mg | ORAL_CAPSULE | Freq: Two times a day (BID) | ORAL | Status: DC
  Administered 2022-02-11 – 2022-02-26 (×21): 100 mg via ORAL
  Filled 2022-02-10 (×26): qty 1

## 2022-02-10 MED ORDER — ACETAMINOPHEN 325 MG PO TABS
650.0000 mg | ORAL_TABLET | Freq: Three times a day (TID) | ORAL | Status: DC
  Administered 2022-02-10 – 2022-02-13 (×7): 650 mg via ORAL
  Filled 2022-02-10 (×7): qty 2

## 2022-02-10 MED ORDER — PROPOFOL 10 MG/ML IV BOLUS
INTRAVENOUS | Status: DC | PRN
  Administered 2022-02-10: 100 mg via INTRAVENOUS

## 2022-02-10 MED ORDER — VANCOMYCIN HCL 1000 MG IV SOLR
INTRAVENOUS | Status: DC | PRN
  Administered 2022-02-10: 1000 mg via TOPICAL

## 2022-02-10 MED ORDER — CLONIDINE HCL (ANALGESIA) 100 MCG/ML EP SOLN
EPIDURAL | Status: DC | PRN
  Administered 2022-02-10: 100 ug

## 2022-02-10 SURGICAL SUPPLY — 51 items
BAG COUNTER SPONGE SURGICOUNT (BAG) ×3 IMPLANT
BENZOIN TINCTURE PRP APPL 2/3 (GAUZE/BANDAGES/DRESSINGS) ×1 IMPLANT
COVER SURGICAL LIGHT HANDLE (MISCELLANEOUS) ×3 IMPLANT
DRAIN PENROSE 0.25X12 (DRAIN) ×2 IMPLANT
DRAIN PENROSE 1/2X12 LTX STRL (WOUND CARE) IMPLANT
DRAPE C-ARM 42X72 X-RAY (DRAPES) IMPLANT
DRAPE IMP U-DRAPE 54X76 (DRAPES) ×3 IMPLANT
DRAPE INCISE IOBAN 66X45 STRL (DRAPES) ×6 IMPLANT
DRAPE U-SHAPE 47X51 STRL (DRAPES) ×6 IMPLANT
DRSG MEPILEX BORDER 4X12 (GAUZE/BANDAGES/DRESSINGS) IMPLANT
DRSG MEPILEX BORDER 4X8 (GAUZE/BANDAGES/DRESSINGS) ×1 IMPLANT
DRSG PAD ABDOMINAL 8X10 ST (GAUZE/BANDAGES/DRESSINGS) IMPLANT
DRSG TEGADERM 4X4.75 (GAUZE/BANDAGES/DRESSINGS) ×2 IMPLANT
DURAPREP 26ML APPLICATOR (WOUND CARE) ×3 IMPLANT
ELECT REM PT RETURN 9FT ADLT (ELECTROSURGICAL) ×3
ELECTRODE REM PT RTRN 9FT ADLT (ELECTROSURGICAL) ×2 IMPLANT
FACESHIELD WRAPAROUND (MASK) IMPLANT
FACESHIELD WRAPAROUND OR TEAM (MASK) ×1 IMPLANT
GAUZE SPONGE 4X4 12PLY STRL (GAUZE/BANDAGES/DRESSINGS) ×2 IMPLANT
GAUZE XEROFORM 5X9 LF (GAUZE/BANDAGES/DRESSINGS) IMPLANT
GLOVE BIO SURGEON ST LM GN SZ9 (GLOVE) ×3 IMPLANT
GLOVE BIOGEL PI IND STRL 8 (GLOVE) ×2 IMPLANT
GLOVE BIOGEL PI INDICATOR 8 (GLOVE) ×1
GLOVE ECLIPSE 8.0 STRL XLNG CF (GLOVE) ×3 IMPLANT
GOWN STRL REUS W/ TWL LRG LVL3 (GOWN DISPOSABLE) ×4 IMPLANT
GOWN STRL REUS W/ TWL XL LVL3 (GOWN DISPOSABLE) ×2 IMPLANT
GOWN STRL REUS W/TWL LRG LVL3 (GOWN DISPOSABLE) ×2
GOWN STRL REUS W/TWL XL LVL3 (GOWN DISPOSABLE) ×1
KIT BASIN OR (CUSTOM PROCEDURE TRAY) ×3 IMPLANT
KIT TURNOVER KIT B (KITS) ×3 IMPLANT
MANIFOLD NEPTUNE II (INSTRUMENTS) ×3 IMPLANT
NS IRRIG 1000ML POUR BTL (IV SOLUTION) ×7 IMPLANT
PACK SHOULDER (CUSTOM PROCEDURE TRAY) ×3 IMPLANT
PACK UNIVERSAL I (CUSTOM PROCEDURE TRAY) ×1 IMPLANT
PAD ARMBOARD 7.5X6 YLW CONV (MISCELLANEOUS) ×6 IMPLANT
PENCIL BUTTON HOLSTER BLD 10FT (ELECTRODE) IMPLANT
SLING ARM IMMOBILIZER LRG (SOFTGOODS) ×2 IMPLANT
SLING ARM IMMOBILIZER MED (SOFTGOODS) IMPLANT
SPONGE T-LAP 4X18 ~~LOC~~+RFID (SPONGE) ×2 IMPLANT
STAPLER VISISTAT 35W (STAPLE) IMPLANT
SUCTION FRAZIER HANDLE 10FR (MISCELLANEOUS)
SUCTION TUBE FRAZIER 10FR DISP (MISCELLANEOUS) IMPLANT
SUT ETHILON 3 0 PS 1 (SUTURE) ×2 IMPLANT
SUT VIC AB 0 CT1 27 (SUTURE) ×1
SUT VIC AB 0 CT1 27XBRD ANBCTR (SUTURE) ×1 IMPLANT
SUT VIC AB 2-0 CTB1 (SUTURE) IMPLANT
TOWEL GREEN STERILE (TOWEL DISPOSABLE) ×3 IMPLANT
TOWEL GREEN STERILE FF (TOWEL DISPOSABLE) ×3 IMPLANT
TUBE CONNECTING 12X1/4 (SUCTIONS) IMPLANT
WATER STERILE IRR 1000ML POUR (IV SOLUTION) ×3 IMPLANT
YANKAUER SUCT BULB TIP NO VENT (SUCTIONS) IMPLANT

## 2022-02-10 NOTE — Brief Op Note (Addendum)
   02/10/2022  12:51 PM  PATIENT:  Carol Rivera  31 y.o. female  PRE-OPERATIVE DIAGNOSIS:  Septic AC Joint  POST-OPERATIVE DIAGNOSIS:  Septic AC Joint  PROCEDURE:  Procedure(s): IRRIGATION AND DEBRIDEMENT SHOULDER, excision ac joint  SURGEON:  Surgeon(s): August Saucer, Corrie Mckusick, MD  ASSISTANT: magnant pa  ANESTHESIA:   general  EBL: 15 ml    Total I/O In: 1750 [I.V.:1000; IV Piggyback:750] Out: 600 [Urine:600]  BLOOD ADMINISTERED: none  DRAINS: none   LOCAL MEDICATIONS USED:  marcaine mso4 clonidine  SPECIMEN:  No Specimen  COUNTS:  YES  TOURNIQUET:  * No tourniquets in log *  DICTATION: .Other Dictation: Dictation Number [id number]  PLAN OF CARE: Admit to inpatient   PATIENT DISPOSITION:  PACU - hemodynamically stable

## 2022-02-10 NOTE — Consult Note (Signed)
Reason for Consult: Left shoulder pain Referring Physician: Dr. Cristy Folks is an 31 y.o. female.  HPI: Carol Rivera is a 31 year old patient with history of IV drug abuse currently admitted for MRSA sepsis.  Describes left shoulder pain.  Noted to have septic AC joint arthritis on MRI scan.  Denies any other joint complaints.  Presents now for further surgical management of infected AC joint.  Patient has been on IV antibiotics and treatment for her sepsis.  Past Medical History:  Diagnosis Date   Asthma    Drug abuse (HCC)     History reviewed. No pertinent surgical history.  History reviewed. No pertinent family history.  Social History:  reports that she has been smoking cigarettes. She has been smoking an average of 1 pack per day. She does not have any smokeless tobacco history on file. She reports that she does not drink alcohol and does not use drugs.  Allergies:  Allergies  Allergen Reactions   Doxycycline Other (See Comments)    REACTION: Joint Pain    Medications: I have reviewed the patient's current medications.  Results for orders placed or performed during the hospital encounter of 02/06/22 (from the past 48 hour(s))  Blood gas, arterial     Status: Abnormal   Collection Time: 02/08/22  2:12 PM  Result Value Ref Range   Delivery systems NASAL CANNULA    pH, Arterial 7.5 (H) 7.35 - 7.45   pCO2 arterial 38 32 - 48 mmHg   pO2, Arterial 104 83 - 108 mmHg   Bicarbonate 29.2 (H) 20.0 - 28.0 mmol/L   Acid-Base Excess 6.4 (H) 0.0 - 2.0 mmol/L   O2 Saturation 99.4 %   Patient temperature 38.9    Collection site RIGHT BRACHIAL    Allens test (pass/fail) POSITIVE (A) PASS    Comment: Performed at [facility], [address] [place], [place] [address]  Comprehensive metabolic panel     Status: Abnormal   Collection Time: 02/09/22  3:40 AM  Result Value Ref Range   Sodium 133 (L) 135 - 145 mmol/L   Potassium 3.7 3.5 - 5.1 mmol/L    Chloride 101 98 - 111 mmol/L   CO2 24 22 - 32 mmol/L   Glucose, Bld 108 (H) 70 - 99 mg/dL    Comment: Glucose reference range applies only to samples taken after fasting for at least 8 hours.   BUN 12 6 - 20 mg/dL   Creatinine, Ser 6.04 0.44 - 1.00 mg/dL   Calcium 7.7 (L) 8.9 - 10.3 mg/dL   Total Protein 5.7 (L) 6.5 - 8.1 g/dL   Albumin 1.6 (L) 3.5 - 5.0 g/dL   AST 62 (H) 15 - 41 U/L   ALT 125 (H) 0 - 44 U/L   Alkaline Phosphatase 125 38 - 126 U/L   Total Bilirubin 5.7 (H) 0.3 - 1.2 mg/dL   GFR, Estimated >54 >09 mL/min    Comment: (NOTE) Calculated using the CKD-EPI Creatinine Equation (2021)    Anion gap 8 5 - 15    Comment: Performed at [facility], [address] [place], [place] [address]  Procalcitonin     Status: None   Collection Time: 02/09/22  3:40 AM  Result Value Ref Range   Procalcitonin 11.87 ng/mL    Comment:        Interpretation: PCT >= 10 ng/mL: Important systemic inflammatory response, almost exclusively due to severe bacterial sepsis or septic shock. (NOTE)  Sepsis PCT Algorithm           Lower Respiratory Tract                                      Infection PCT Algorithm    ----------------------------     ----------------------------         PCT < 0.25 ng/mL                PCT < 0.10 ng/mL          Strongly encourage             Strongly discourage   discontinuation of antibiotics    initiation of antibiotics    ----------------------------     -----------------------------       PCT 0.25 - 0.50 ng/mL            PCT 0.10 - 0.25 ng/mL               OR       >80% decrease in PCT            Discourage initiation of                                            antibiotics      Encourage discontinuation           of antibiotics    ----------------------------     -----------------------------         PCT >= 0.50 ng/mL              PCT 0.26 - 0.50 ng/mL                AND       <80% decrease in PCT             Encourage initiation of                                              antibiotics       Encourage continuation           of antibiotics    ----------------------------     -----------------------------        PCT >= 0.50 ng/mL                  PCT > 0.50 ng/mL               AND         increase in PCT                  Strongly encourage                                      initiation of antibiotics    Strongly encourage escalation           of antibiotics                                     -----------------------------  PCT <= 0.25 ng/mL                                                 OR                                        > 80% decrease in PCT                                      Discontinue / Do not initiate                                             antibiotics  Performed at [facility], [address] [place], [place] [address]   CBC     Status: Abnormal   Collection Time: 02/09/22  3:40 AM  Result Value Ref Range   WBC 20.5 (H) 4.0 - 10.5 K/uL   RBC 4.38 3.87 - 5.11 MIL/uL   Hemoglobin 10.6 (L) 12.0 - 15.0 g/dL   HCT 29.9 (L) 24.2 - 68.3 %   MCV 74.7 (L) 80.0 - 100.0 fL   MCH 24.2 (L) 26.0 - 34.0 pg   MCHC 32.4 30.0 - 36.0 g/dL   RDW 41.9 (H) 62.2 - 29.7 %   Platelets 149 (L) 150 - 400 K/uL    Comment: REPEATED TO VERIFY   nRBC 0.0 0.0 - 0.2 %    Comment: Performed at [facility], [address], [place], [place] [address]  CK     Status: Abnormal   Collection Time: 02/09/22 10:28 AM  Result Value Ref Range   Total CK 14 (L) 38 - 234 U/L    Comment: Performed at [facility], [address] [place], [place] [address]  Glucose, capillary     Status: None   Collection Time: 02/09/22 12:02 PM  Result Value Ref Range   Glucose-Capillary 84 70 - 99 mg/dL    Comment: Glucose reference range applies only to samples taken after fasting for at least 8 hours.  Glucose, capillary     Status: None   Collection Time: 02/09/22  4:50 PM  Result  Value Ref Range   Glucose-Capillary 76 70 - 99 mg/dL    Comment: Glucose reference range applies only to samples taken after fasting for at least 8 hours.  Lactic acid, plasma     Status: None   Collection Time: 02/09/22  4:55 PM  Result Value Ref Range   Lactic Acid, Venous 1.6 0.5 - 1.9 mmol/L    Comment: Performed at [facility], [address], [place], [place] [address]  Comprehensive metabolic panel     Status: Abnormal   Collection Time: 02/10/22  6:53 AM  Result Value Ref Range   Sodium 134 (L) 135 - 145 mmol/L   Potassium 3.5 3.5 - 5.1 mmol/L   Chloride 104 98 - 111 mmol/L   CO2 20 (L) 22 - 32 mmol/L   Glucose, Bld 55 (L) 70 - 99 mg/dL    Comment: Glucose reference range  applies only to samples taken after fasting for at least 8 hours.   BUN 12 6 - 20 mg/dL   Creatinine, Ser 5.360.62 0.44 - 1.00 mg/dL   Calcium 7.2 (L) 8.9 - 10.3 mg/dL   Total Protein 5.6 (L) 6.5 - 8.1 g/dL   Albumin <6.4<1.5 (L) 3.5 - 5.0 g/dL   AST 45 (H) 15 - 41 U/L   ALT 69 (H) 0 - 44 U/L   Alkaline Phosphatase 186 (H) 38 - 126 U/L   Total Bilirubin 6.3 (H) 0.3 - 1.2 mg/dL   GFR, Estimated >40>60 >34>60 mL/min    Comment: (NOTE) Calculated using the CKD-EPI Creatinine Equation (2021)    Anion gap 10 5 - 15    Comment: Performed at [facility], [address] [place], [place] [address]  Phosphorus     Status: None   Collection Time: 02/10/22  6:53 AM  Result Value Ref Range   Phosphorus 4.1 2.5 - 4.6 mg/dL    Comment: Performed at [facility], [address] [place], [place] [address]  Magnesium     Status: None   Collection Time: 02/10/22  6:53 AM  Result Value Ref Range   Magnesium 2.0 1.7 - 2.4 mg/dL    Comment: Performed at [facility], [address] [place], [place] [address]    DG CHEST PORT 1 VIEW  Result Date: 02/09/2022 CLINICAL DATA:  Pleural effusion EXAM: PORTABLE CHEST 1 VIEW COMPARISON:  02/08/2022 FINDINGS: Normal heart size. Patchy bilateral airspace  opacities with interval worsening compared to prior. No large pleural fluid collection. No pneumothorax. IMPRESSION: Patchy bilateral airspace opacities with interval worsening compared to prior. Electronically Signed   By: Duanne GuessNicholas  Plundo D.O.   On: 02/09/2022 14:02   DG CHEST PORT 1 VIEW  Result Date: 02/08/2022 CLINICAL DATA:  Shortness of breath and fever. EXAM: PORTABLE CHEST 1 VIEW COMPARISON:  02/07/2022 CT and prior studies FINDINGS: Patchy ground-glass/airspace opacities are again noted likely representing infection/septic emboli as previously described. No definite new pulmonary opacities are present. No pleural effusion or pneumothorax. Cardiomediastinal silhouette is unchanged. No acute bony abnormalities are noted. IMPRESSION: Patchy ground-glass/airspace opacities again noted likely representing infection/septic emboli. Electronically Signed   By: Harmon PierJeffrey  Hu M.D.   On: 02/08/2022 15:01    Review of Systems  Unable to perform ROS: Patient nonverbal  Blood pressure 120/82, pulse (!) 127, temperature 98.6 F (37 C), temperature source Axillary, resp. rate 17, height 5' (1.524 m), weight 63.5 kg, SpO2 97 %. Physical Exam Vitals reviewed.  Constitutional:      Appearance: Normal appearance.  HENT:     Head: Normocephalic.     Nose: Nose normal.     Mouth/Throat:     Mouth: Mucous membranes are moist.  Eyes:     Pupils: Pupils are equal, round, and reactive to light.  Cardiovascular:     Rate and Rhythm: Normal rate.     Pulses: Normal pulses.  Pulmonary:     Effort: Pulmonary effort is normal.  Abdominal:     General: Abdomen is flat.  Musculoskeletal:     Cervical back: Normal range of motion.  Skin:    General: Skin is warm.     Capillary Refill: Capillary refill takes less than 2 seconds.  Examination of the left upper extremity demonstrates tenderness at the [facility] joint but no fluctuance or erythema is present.  Passive shoulder range of motion on both sides is otherwise  intact.  No pain with range of motion of either elbow wrist knee and ankle or hip.  No effusion or swelling in the ankle bilaterally or knees bilaterally.  Pedal pulses palpable.  Radial pulses palpable.  Assessment/Plan: Impression is left-sided AC joint septic arthritis in a patient with MRSA endocarditis.  Plan is open distal clavicle excision and debridement of the AC joint.  Risk and benefits are discussed including not limited to persistent infection shoulder pain some diminished function as well as potential for further infection.  Patient understands risk and benefits.  All questions answered.  MRI scan looks fairly clear-cut in terms of early infection in the [facility] joint but no shoulder joint infection itself.  Arthroscopy not planned for this case.  Marrianne Mood Jaycie Kregel 02/10/2022, 10:52 AM

## 2022-02-10 NOTE — Progress Notes (Addendum)
   NAME:  Carol Rivera, MRN:  [id number], DOB:  04-Sep-1991, LOS: 4 ADMISSION DATE:  02/06/2022, CONSULTATION DATE:  02/08/22 REFERRING MD:  Glade Lloyd, MD CHIEF COMPLAINT:  Endocarditis   History of Present Illness:  Carol Rivera is a 31 year old woman with history of IV drug use who was admitted 02/06/22 with fever, left shoulder pain and general malaise for a few days prior to admission. 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 She has septic pulmonary emboli noted on CT Chest scan 02/07/22. She has MRI of left shoulder pending and MRI Spine without obvious abscess/phlegmon.   PCCM consulted today for increasing respiratory rate and altered mentation.   Patient sitting up in bed taking oral medications at time of evaluation. She complains of diffuse chest and back pain along with left shoulder pain.   Pertinent  Medical History  Asthma IV drug use   Significant Hospital Events: Including procedures, antibiotic start and stop dates in addition to other pertinent events   5/18 admitted to hospital for MRSA endocarditis/bacteremia 5/20 PCCM consulted for evaluation, and did not require ICU level care so CCM signed off. Moved to [facility], [facility] service  5/21 PCCM consulted again   Interim History / Subjective:  No events Still having pain and anxiety  Objective   Blood pressure 127/76, pulse (!) 130, temperature 99.1 F (37.3 C), temperature source Oral, resp. rate (!) 55, height 5' (1.524 m), weight 63.5 kg, SpO2 98 %.        Intake/Output Summary (Last 24 hours) at 02/10/2022 0802 Last data filed at 02/09/2022 2245 Gross per 24 hour  Intake --  Output 1200 ml  Net -1200 ml    Filed Weights   02/07/22 0649 02/09/22 0317 02/10/22 0500  Weight: 52 kg 61.2 kg 63.5 kg   Examination: Ill appearing tachypneic C/o bilateral shoulder pain Lungs clear Heart sounds tachycardic, ext warm Febrile  Resolved Hospital  Problem list     Assessment & Plan:    Sepsis due to MRSA endocarditis/bacteremia, with mobile tricuspid valve mass Septic arthritis L shoulder  Possible early osteomyelitis  Septic pulmonary emboli Opiate abuse disorder Tobacco use  Overall having some combination of pain/anxiety and withdrawal.  Sleeping through her scheduled oxycodone due to dilaudid IVPs.  Also getting xanax.  - Drop xanax from 1mg  TID PRN to 0.5mg  TID TPRN - Start lorazepam 1mg  BID - Continue oxycodone 5mg  q6h, switch dilaudid in half - Start seroquel 50mg  BID - Start standing toradol, tylenol (liver injury has resolved)  Abx/source control per ID  For shoulder washout today, if tolerates well will consider stepdown orders later today  MD PCCM

## 2022-02-10 NOTE — Progress Notes (Signed)
eLink Physician-Brief Progress Note Patient Name: Carol Rivera DOB: 1990-12-27 MRN: [id number]   Date of Service  02/10/2022  HPI/Events of Note  Patient with RR 40-50s however in distress and will answer questions appropriately when awakened. SpO2 100% on 2L  CT head reviewed and negative for acute intracranial abnormalities  eICU Interventions  Continue to monitor for signs and symptoms of respiratory distress/failure Communicated with bedside RN     Intervention Category Minor Interventions: Agitation / anxiety - evaluation and management  Daquarius Dubeau Mechele Collin 02/10/2022, 11:03 PM

## 2022-02-10 NOTE — Anesthesia Postprocedure Evaluation (Signed)
Anesthesia Post Note  Patient: Carol Rivera  Procedure(s) Performed: IRRIGATION AND DEBRIDEMENT SHOULDER (Left: Shoulder)     Patient location during evaluation: SICU Anesthesia Type: General Level of consciousness: sedated Pain management: pain level controlled Vital Signs Assessment: post-procedure vital signs reviewed and stable Respiratory status: patient remains intubated per anesthesia plan Cardiovascular status: stable Postop Assessment: no apparent nausea or vomiting Anesthetic complications: no   No notable events documented.  Last Vitals:  Vitals:   02/10/22 1335 02/10/22 1354  BP: 119/65   Pulse: (!) 112 (!) 113  Resp: (!) 42 (!) 42  Temp:    SpO2: 99% 96%    Last Pain:  Vitals:   02/10/22 0929  TempSrc: Axillary  PainSc:                  Nelle Don [place]

## 2022-02-10 NOTE — Anesthesia Preprocedure Evaluation (Addendum)
Anesthesia Evaluation  Patient identified by MRN, date of birth, ID bandGeneral Assessment Comment:Patient somnolent to unresponsive upon arrival to pre-op  Reviewed: Allergy & Precautions, Patient's Chart, lab work & pertinent test results  Airway Mallampati: II  TM Distance: >3 FB Neck ROM: Full    Dental  (+) Teeth Intact   Pulmonary asthma , Current Smoker,     + decreased breath sounds      Cardiovascular  Rhythm:Regular Rate:Tachycardia  TV endocarditis    Neuro/Psych negative neurological ROS  negative psych ROS   GI/Hepatic negative GI ROS, (+)     substance abuse  IV drug use,   Endo/Other  negative endocrine ROS  Renal/GU negative Renal ROS  negative genitourinary   Musculoskeletal  (+) Arthritis , Osteoarthritis,  Septic arthritis    Abdominal Normal abdominal exam  (+)   Peds  Hematology negative hematology ROS (+)   Anesthesia Other Findings Patient received extensive anxiolytics/pain medications prior to arrival to pre-op area, (Dilaudid, seroquel, Ativan, Klonopin) and was unresponsive upon arrival except to painful stimuli. Pupils pinpoint, tachycardic but remaining vitals stable on Kelliher. Patient medicated with 0.2mg  and 10 minutes later additional 0.2mg  Narcan with minimal improvement. 0.2mg  flumazenil administrated with mild improvement. Patient opens eyes to loud verbal prompting, pupils equal and reactive. Withdraws to pain with intact gag reflex.   Reproductive/Obstetrics                          Anesthesia Physical Anesthesia Plan  ASA: 3  Anesthesia Plan: General   Post-op Pain Management:    Induction: Intravenous  PONV Risk Score and Plan: 2 and Ondansetron, Dexamethasone, Midazolam and Treatment may vary due to age or medical condition  Airway Management Planned: Mask and Oral ETT  Additional Equipment: None  Intra-op Plan:   Post-operative Plan: Possible  Post-op intubation/ventilation  Informed Consent: I have reviewed the patients History and Physical, chart, labs and discussed the procedure including the risks, benefits and alternatives for the proposed anesthesia with the patient or authorized representative who has indicated his/her understanding and acceptance.     Dental advisory given  Plan Discussed with: CRNA  Anesthesia Plan Comments: (Lab Results      Component                Value               Date                      WBC                      20.5 (H)            02/09/2022                HGB                      10.6 (L)            02/09/2022                HCT                      32.7 (L)            02/09/2022                MCV  74.7 (L)            02/09/2022                PLT                      149 (L)             02/09/2022           Lab Results      Component                Value               Date                      NA                       133 (L)             02/09/2022                K                        3.7                 02/09/2022                CO2                      24                  02/09/2022                GLUCOSE                  108 (H)             02/09/2022                BUN                      12                  02/09/2022                CREATININE               0.67                02/09/2022                CALCIUM                  7.7 (L)             02/09/2022                GFRNONAA                 >60                 02/09/2022            ECHO 05/23: 1. Left ventricular ejection fraction, by estimation, is 60 to 65%. The  left ventricle has normal function. The left ventricle has no regional  wall motion abnormalities. Left ventricular diastolic parameters were  normal.  2. Right ventricular systolic function is normal. The right ventricular  size is normal.  3. The mitral valve is normal in structure. No evidence of mitral valve  regurgitation. No  evidence of mitral stenosis.  4. Likely vegetation on the TV However appears on the ventricular side of  the valve Suggest TEE to further evaluate Large mobile mass appers  attached to the TV chords.  5. The aortic valve is normal in structure. Aortic valve regurgitation is  not visualized. No aortic stenosis is present.  6. The inferior vena cava is normal in size with greater than 50%  respiratory variability, suggesting right atrial pressure of 3 mmHg. )      Anesthesia Quick Evaluation

## 2022-02-10 NOTE — Progress Notes (Signed)
     [address]       [place] [address]             [phone]       Echo and chart reviewed Vegetations are on the ventricular side, and thus would not be accessible via angiovac.  In regards to surgery, given her recent IV drug use, she would also not be a candidate for valve replacement.  Continue medical management for now.  Tammie Ellsworth Keane Scrape

## 2022-02-10 NOTE — Progress Notes (Signed)
    [facility] has been requested to perform a transesophageal echocardiogram on Carol Rivera for bacteremia in the setting of IVDU. Per chart review, patient has a past medical history of childhood asthma and IVDU. She presented to the ER on 5/18 complaining of fevers, pleuritic chest pain, productive cough, and severe left shoulder pain. Patient was admitted to the hospitalist service for treatment of Sepsis. Found to have MRSA bacteremia, septic pulmonary emboli, and a septic AC joint. Echocardiogram showed EF 60-65%, normal LV diastolic parameters, Likely vegetation on the TV However appears on the ventricular side of the valve. Suggested TEE to further evaluate the large, mobile mass that appears to be attached to the TV chords. Of note, CT surgery evaluated the patient and deemed that she was not a surgical candidate due to her recent IVDU and the vegetation s are not accessible via angiovac.   Patient had irrigation and debridement of the left AC joint earlier today. Because she has received anesthesia <6 hours ago, the patient cannot consent to the TEE today. I have made patient NPO and placed orders for tomorrow. Patient will need to be formally consulted tomorrow AM prior to TEE.   Jonita Albee, PA-C 02/10/2022 3:55 PM

## 2022-02-10 NOTE — Procedures (Signed)
Extubation Procedure Note  Patient Details:   Name: Carol Rivera DOB: January 03, 1991 MRN: [id number]   Airway Documentation:    Vent end date: 02/10/22 Vent end time: 1351   Evaluation  O2 sats: stable throughout Complications: No apparent complications Patient did tolerate procedure well. Bilateral Breath Sounds: Rhonchi   Yes   Pt extubated per MD order. Placed on 2L [place]. Positive cuff leak noted, no stridor heard. Pts vitals stable throughout. RT will continue to monitor.  Tobi Bastos 02/10/2022, 1:57 PM

## 2022-02-10 NOTE — Anesthesia Procedure Notes (Signed)
Procedure Name: Intubation Date/Time: 02/10/2022 10:59 AM Performed by: Amadeo Garnet, CRNA Pre-anesthesia Checklist: Patient identified, Emergency Drugs available, Suction available and Patient being monitored Patient Re-evaluated:Patient Re-evaluated prior to induction Oxygen Delivery Method: Circle system utilized Preoxygenation: Pre-oxygenation with 100% oxygen Induction Type: IV induction and Rapid sequence Laryngoscope Size: Mac and 4 Grade View: Grade I Tube type: Oral Tube size: 7.5 mm Number of attempts: 1 Airway Equipment and Method: Stylet and Oral airway Placement Confirmation: ETT inserted through vocal cords under direct vision, positive ETCO2 and breath sounds checked- equal and bilateral Secured at: 22 cm Tube secured with: Tape Dental Injury: Teeth and Oropharynx as per pre-operative assessment

## 2022-02-10 NOTE — Transfer of Care (Signed)
Immediate Anesthesia Transfer of Care Note  Patient: Carol Rivera  Procedure(s) Performed: IRRIGATION AND DEBRIDEMENT SHOULDER (Left: Shoulder)  Patient Location: SICU  Anesthesia Type:General  Level of Consciousness: obtunded and Patient remains intubated per anesthesia plan  Airway & Oxygen Therapy: Patient Spontanous Breathing, Patient remains intubated per anesthesia plan and Patient placed on Ventilator (see vital sign flow sheet for setting)  Post-op Assessment: Report given to RN and Post -op Vital signs reviewed and stable  Post vital signs: Reviewed and stable  Last Vitals:  Vitals Value Taken Time  BP 95/61 02/10/22 1254  Temp    Pulse 107 02/10/22 1316  Resp 36 02/10/22 1316  SpO2 99 % 02/10/22 1316  Vitals shown include unvalidated device data.  Last Pain:  Vitals:   02/10/22 0929  TempSrc: Axillary  PainSc:       Patients Stated Pain Goal: 2 ([id number])  Complications: No notable events documented.

## 2022-02-10 NOTE — Progress Notes (Signed)
After procedure the CRNA made this RN aware of concern in change of neuro status throughout procedure. On arrival to the unit patient's sedation was cut off and patient was hard to arise. Dr Katrinka Blazing made aware of CRNA's concerns and instructed RN to wean ventilator as able. Patient started to follow commands and reach for ET tube. Patient extubated with no complications. This RN asked patient orientation questions and only received answer when eliciting painful stimulus. MD made aware and placed order for Narcan and EEG.

## 2022-02-10 NOTE — Progress Notes (Signed)
EEG complete - results pending 

## 2022-02-10 NOTE — Procedures (Signed)
Patient Name: Carol Rivera  MRN: [id number]  Epilepsy Attending: Charlsie Quest  Referring Physician/Provider: Lorin Glass, MD Date: 02/10/2022 Duration: 21.58 mins  Patient history: 31yo F with ams. EEG to evaluate for seizure  Level of alertness: Awake, asleep  AEDs during EEG study: Clonazepam, Xanax  Technical aspects: This EEG study was done with scalp electrodes positioned according to the 10-20 International system of electrode placement. Electrical activity was acquired at a sampling rate of 500Hz  and reviewed with a high frequency filter of 70Hz  and a low frequency filter of 1Hz . EEG data were recorded continuously and digitally stored.   Description: The posterior dominant rhythm consists of 8-9 Hz activity of moderate voltage (25-35 uV) seen predominantly in posterior head regions, symmetric and reactive to eye opening and eye closing. Sleep was characterized by vertex waves, sleep spindles (12 to 14 Hz), maximal frontocentral region. EEG showed intermittent generalized 3 to 6 Hz theta-delta slowing. Hyperventilation and photic stimulation were not performed.     ABNORMALITY - Intermittent slow, generalized  IMPRESSION: This study is suggestive of mild diffuse encephalopathy, nonspecific etiology. No seizures or epileptiform discharges were seen throughout the recording.  Nazeer Romney 

## 2022-02-10 NOTE — Progress Notes (Signed)
Arrives from OR obtunded on vent despite opiate reversal agents After about an hour started waking up and pulling at tube Extubated Remains able to follow commands but only with extensive prompting Suspicion here is probably worsened septic encephalopathy after shoulder washout complicated by opiates/benzos/seroquel Exam nonfocal, pupils equal but small, reactive arguing against large stroke Will give more narcan, hold sedating meds, and time, if not awake in a few hours will send for head CT Check EEG  Additional 34 min cc time Myrla Halsted MD PCCM

## 2022-02-10 NOTE — Op Note (Signed)
NAME: RAMAH, LERRO MEDICAL RECORD NO: [id number] ACCOUNT NO: [id number] DATE OF BIRTH: [birth year] FACILITY: [facility] LOCATION: [facility] PHYSICIAN: Yetta Barre. Marlou Sa, MD  Operative Report   DATE OF PROCEDURE: 02/10/2022  PREOPERATIVE DIAGNOSIS:  Septic left AC joint.  POSTOPERATIVE DIAGNOSIS:  Septic left AC joint.  PROCEDURE:  Left shoulder acromioclavicular joint resection and extensive debridement due to infection.  SURGEON:  Yetta Barre. Marlou Sa, MD  ASSISTANT:  Annie Main.  INDICATIONS:  The patient is a 31 year old patient with IV drug abuse history with septic left AC joint.  Concern for osteomyelitis in the distal end of the clavicle.  She presents now for operative management after explanation of risks and benefits.  DESCRIPTION OF PROCEDURE:  The patient was brought to the operating room where general anesthetic was induced.  Perioperative IV antibiotics were maintained. Left shoulder prescrubbed with alcohol and Betadine, allowed to air dry.  Prepped with DuraPrep  solution and draped in sterile manner.  The patient was placed in the beach chair position with the head in neutral position.  After calling time-out, an incision made in line with the longitudinal axis of the distal clavicle.  Skin and subcutaneous  tissue were sharply divided.  Purulence was encountered.  Once we got just above the [facility] joint.  This was sent for culture.  This infection pocket actually extended on the superior aspect of the clavicle extending posteromedially.  The full extent of this  infection was visualized, by extending the incision to the neck.  Care was taken to avoid injury to sensory nerves in the region.  Once the full extent of that infection region was visualized it was evacuated and debrided.  Subperiosteal elevation was  performed off the distal end of the clavicle.  The [facility] joint itself was significantly involved.  The meniscus was removed.  The distal 8 mm of the clavicle was removed parallel to  the joint space.  Extensive debridement was performed, both with a knife,  rongeur and curettes.  The entire cavity region was debrided.  Adequate decompression of that [facility] joint space was confirmed by moving the shoulder around with my small finger in the joint.  No compression was present.  Following this, thorough irrigation  with 3 liters of irrigating solution was performed.  The irrigation and debridement did extend into the superficial bursa and subacromial space.  There was no active purulence; however, within the subacromial space on visual inspection.  Following  extensive debridement and irrigation, the vancomycin powder was placed 1 gram into the joint and subacromial space region.  Then, the superior fascia was closed loosely using #1 Vicryl suture followed by 3-0 nylon suture in the skin again with loose  approximation.  The Penrose drain was placed, which will be removed tomorrow.  Bulky dressing applied.  The patient was then transferred to the recovery room in stable condition.  Shoulder sling applied.  Luke's assistance was required for opening,  closing, retraction, sawing of the distal end of the clavicle off debridement.  His assistance was a medical necessity.   PUS D: 02/10/2022 12:57:04 pm T: 02/10/2022 4:18:00 pm  JOB: [id number]

## 2022-02-11 ENCOUNTER — Inpatient Hospital Stay (HOSPITAL_COMMUNITY): Payer: Medicaid Other

## 2022-02-11 ENCOUNTER — Inpatient Hospital Stay (HOSPITAL_COMMUNITY): Payer: Medicaid Other | Admitting: Anesthesiology

## 2022-02-11 ENCOUNTER — Encounter (HOSPITAL_COMMUNITY): Payer: Self-pay | Admitting: Family Medicine

## 2022-02-11 ENCOUNTER — Encounter (HOSPITAL_COMMUNITY)
Admission: EM | Discharge status: Against Medical Advice | Payer: Self-pay | Source: Home / Self Care | Attending: Internal Medicine

## 2022-02-11 DIAGNOSIS — R7881 Bacteremia: Secondary | ICD-10-CM | POA: Diagnosis not present

## 2022-02-11 DIAGNOSIS — I071 Rheumatic tricuspid insufficiency: Secondary | ICD-10-CM

## 2022-02-11 DIAGNOSIS — I38 Endocarditis, valve unspecified: Secondary | ICD-10-CM

## 2022-02-11 DIAGNOSIS — R6521 Severe sepsis with septic shock: Secondary | ICD-10-CM | POA: Diagnosis not present

## 2022-02-11 DIAGNOSIS — I361 Nonrheumatic tricuspid (valve) insufficiency: Secondary | ICD-10-CM

## 2022-02-11 DIAGNOSIS — K7200 Acute and subacute hepatic failure without coma: Secondary | ICD-10-CM | POA: Diagnosis not present

## 2022-02-11 DIAGNOSIS — M009 Pyogenic arthritis, unspecified: Secondary | ICD-10-CM

## 2022-02-11 DIAGNOSIS — A419 Sepsis, unspecified organism: Secondary | ICD-10-CM | POA: Diagnosis not present

## 2022-02-11 HISTORY — PX: TEE WITHOUT CARDIOVERSION: SHX5443

## 2022-02-11 LAB — CBC
HCT: 27.5 % — ABNORMAL LOW (ref 36.0–46.0)
Hemoglobin: 9.5 g/dL — ABNORMAL LOW (ref 12.0–15.0)
MCH: 24.9 pg — ABNORMAL LOW (ref 26.0–34.0)
MCHC: 34.5 g/dL (ref 30.0–36.0)
MCV: 72.2 fL — ABNORMAL LOW (ref 80.0–100.0)
Platelets: 159 10*3/uL (ref 150–400)
RBC: 3.81 MIL/uL — ABNORMAL LOW (ref 3.87–5.11)
RDW: 23 % — ABNORMAL HIGH (ref 11.5–15.5)
WBC: 32.2 10*3/uL — ABNORMAL HIGH (ref 4.0–10.5)
nRBC: 0.1 % (ref 0.0–0.2)

## 2022-02-11 LAB — COMPREHENSIVE METABOLIC PANEL
ALT: 46 U/L — ABNORMAL HIGH (ref 0–44)
AST: 43 U/L — ABNORMAL HIGH (ref 15–41)
Albumin: 1.6 g/dL — ABNORMAL LOW (ref 3.5–5.0)
Alkaline Phosphatase: 105 U/L (ref 38–126)
Anion gap: 8 (ref 5–15)
BUN: 18 mg/dL (ref 6–20)
CO2: 19 mmol/L — ABNORMAL LOW (ref 22–32)
Calcium: 7.1 mg/dL — ABNORMAL LOW (ref 8.9–10.3)
Chloride: 110 mmol/L (ref 98–111)
Creatinine, Ser: 0.7 mg/dL (ref 0.44–1.00)
GFR, Estimated: >60 mL/min (ref >60)
Glucose, Bld: 86 mg/dL (ref 70–99)
Potassium: 3.4 mmol/L — ABNORMAL LOW (ref 3.5–5.1)
Sodium: 137 mmol/L (ref 135–145)
Total Bilirubin: 4.8 mg/dL — ABNORMAL HIGH (ref 0.3–1.2)
Total Protein: 5.2 g/dL — ABNORMAL LOW (ref 6.5–8.1)

## 2022-02-11 LAB — GLUCOSE, CAPILLARY
Glucose-Capillary: 83 mg/dL (ref 70–99)
Glucose-Capillary: 88 mg/dL (ref 70–99)
Glucose-Capillary: 132 mg/dL — ABNORMAL HIGH (ref 70–99)

## 2022-02-11 LAB — MAGNESIUM: Magnesium: 2.1 mg/dL (ref 1.7–2.4)

## 2022-02-11 SURGERY — ECHOCARDIOGRAM, TRANSESOPHAGEAL
Anesthesia: Monitor Anesthesia Care

## 2022-02-11 MED ORDER — PROPOFOL 500 MG/50ML IV EMUL
INTRAVENOUS | Status: DC | PRN
  Administered 2022-02-11: 20 mg via INTRAVENOUS
  Administered 2022-02-11: 75 ug/kg/min via INTRAVENOUS
  Administered 2022-02-11: 40 mg via INTRAVENOUS

## 2022-02-11 MED ORDER — POTASSIUM CHLORIDE 10 MEQ/100ML IV SOLN
10.0000 meq | INTRAVENOUS | Status: AC
  Administered 2022-02-11 (×3): 10 meq via INTRAVENOUS
  Filled 2022-02-11 (×3): qty 100

## 2022-02-11 MED ORDER — SODIUM BICARBONATE 8.4 % IV SOLN
INTRAVENOUS | Status: AC
  Filled 2022-02-11 (×2): qty 1000

## 2022-02-11 MED ORDER — PHENYLEPHRINE 80 MCG/ML (10ML) SYRINGE FOR IV PUSH (FOR BLOOD PRESSURE SUPPORT)
PREFILLED_SYRINGE | INTRAVENOUS | Status: DC | PRN
  Administered 2022-02-11 (×2): 160 ug via INTRAVENOUS

## 2022-02-11 MED ORDER — QUETIAPINE FUMARATE 25 MG PO TABS
25.0000 mg | ORAL_TABLET | Freq: Every day | ORAL | Status: DC
  Administered 2022-02-11 – 2022-02-25 (×15): 25 mg via ORAL
  Filled 2022-02-11 (×15): qty 1

## 2022-02-11 MED ORDER — CLONAZEPAM 0.5 MG PO TABS
0.5000 mg | ORAL_TABLET | Freq: Two times a day (BID) | ORAL | Status: DC
  Administered 2022-02-11 – 2022-02-26 (×30): 0.5 mg via ORAL
  Filled 2022-02-11 (×30): qty 1

## 2022-02-11 NOTE — Progress Notes (Signed)
This RN walked into the room and noticed left shoulder dressing pulled off by patient. Upon taking the remainder of the dressing down the pennrose drain was out of place. No drainage from site. Dr Lajoyce Corners made aware no new orders at this time.

## 2022-02-11 NOTE — Interval H&P Note (Signed)
History and Physical Interval Note:  02/11/2022 9:46 AM  Carol Rivera  has presented today for surgery, with the diagnosis of BACTEREMIA.  The various methods of treatment have been discussed with the patient's mother, since the patient was sedated. After consideration of risks, benefits and other options for treatment, the patient has consented to  Procedure(s): TRANSESOPHAGEAL ECHOCARDIOGRAM (TEE) (N/A) as a surgical intervention.  The patient's history has been reviewed, patient examined, no change in status, stable for surgery.  I have reviewed the patient's chart and labs.  Questions were answered to the patient's satisfaction.     Janessa Mickle

## 2022-02-11 NOTE — Op Note (Signed)
INDICATIONS: infective endocarditis  PROCEDURE:   Informed consent was obtained prior to the procedure. The risks, benefits and alternatives for the procedure were discussed and the patient comprehended these risks.  Risks include, but are not limited to, cough, sore throat, vomiting, nausea, somnolence, esophageal and stomach trauma or perforation, bleeding, low blood pressure, aspiration, pneumonia, infection, trauma to the teeth and death.    After a procedural time-out, the oropharynx was anesthetized with 20% benzocaine spray.   During this procedure the patient was administered IV propofol by Anesthesiology, Dr. Curt Jews.  The transesophageal probe was inserted in the esophagus and stomach without difficulty and multiple views were obtained.  The patient was kept under observation until the patient left the procedure room.  The patient left the procedure room in stable condition.   Agitated microbubble saline contrast was not administered.  COMPLICATIONS:    There were no immediate complications.  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 Otherwise normal TEE.  RECOMMENDATIONS:    Consider Angiovac to prevent additional pulmonary septic embolus, but the valve is severely damaged already.  Time Spent Directly with the Patient:  30 minutes   Carol Rivera 02/11/2022, 9:56 AM

## 2022-02-11 NOTE — Anesthesia Postprocedure Evaluation (Signed)
Anesthesia Post Note  Patient: Carol Rivera  Procedure(s) Performed: TRANSESOPHAGEAL ECHOCARDIOGRAM (TEE)     Patient location during evaluation: Endoscopy Anesthesia Type: MAC Level of consciousness: awake and alert Pain management: pain level controlled Vital Signs Assessment: post-procedure vital signs reviewed and stable Respiratory status: spontaneous breathing, nonlabored ventilation, respiratory function stable and patient connected to nasal cannula oxygen Cardiovascular status: stable and blood pressure returned to baseline Postop Assessment: no apparent nausea or vomiting Anesthetic complications: no   No notable events documented.  Last Vitals:  Vitals:   02/11/22 1030 02/11/22 1048  BP: 123/75   Pulse: 95 97  Resp: (!) 43 (!) 42  Temp: 36.5 C   SpO2: 96% 97%    Last Pain:  Vitals:   02/11/22 1030  TempSrc: Axillary  PainSc: Asleep                 Belenda Cruise P Constance Hackenberg

## 2022-02-11 NOTE — H&P (View-Only) (Signed)
 NAME:  Carol Rivera, MRN:  [id number], DOB:  05/06/1991, LOS: 5 ADMISSION DATE:  02/06/2022, CONSULTATION DATE:  02/08/22 REFERRING MD:  Kshitiz Alekh, MD CHIEF COMPLAINT:  Endocarditis   History of Present Illness:  Carol Rivera is a 31 year old woman with history of IV drug use who was admitted 02/06/22 with fever, left shoulder pain and general malaise for a few days prior to admission. 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 She has septic pulmonary emboli noted on CT Chest scan 02/07/22. She has MRI of left shoulder pending and MRI Spine without obvious abscess/phlegmon.   PCCM consulted today for increasing respiratory rate and altered mentation.   Patient sitting up in bed taking oral medications at time of evaluation. She complains of diffuse chest and back pain along with left shoulder pain.   Pertinent  Medical History  Asthma IV drug use   Significant Hospital Events: Including procedures, antibiotic start and stop dates in addition to other pertinent events   5/18 admitted to hospital for MRSA endocarditis/bacteremia 5/20 PCCM consulted for evaluation, and did not require ICU level care so CCM signed off. Moved to [facility], on [facility] service  5/21 PCCM consulted again  5/22 L shoulder washout, extubated in ICU bc wound not wake up much, had neg head CT 5/23 waking up more but still sleepy  Interim History / Subjective:  No events. Remains tachypneic/tachycardic Now awake and following commands  Objective   Blood pressure 121/78, pulse (!) 120, temperature 99.3 F (37.4 C), temperature source Oral, resp. rate (!) 32, height 5' (1.524 m), weight 64.7 kg, SpO2 98 %.    Vent Mode: PSV;CPAP FiO2 (%):  [40 %] 40 % Set Rate:  [16 bmp] 16 bmp Vt Set:  [360 mL] 360 mL PEEP:  [5 cmH20] 5 cmH20 Pressure Support:  [8 cmH20] 8 cmH20 Plateau Pressure:  [25 cmH20] 25 cmH20   Intake/Output Summary (Last 24 hours) at  02/11/2022 0750 Last data filed at 02/11/2022 0600 Gross per 24 hour  Intake 13778.55 ml  Output 2095 ml  Net 11683.55 ml    Filed Weights   02/09/22 0317 02/10/22 0500 02/11/22 0600  Weight: 61.2 kg 63.5 kg 64.7 kg   Examination: Ill appearing tachypneic More sleepy than yesterday L shoulder dressed with some bloody strikethrough Ext warm Moves all 4 ext to command Oriented per nursing  WBC up Having hypoglycemia and mild stable nongap acidosis  Resolved Hospital Problem list     Assessment & Plan:    Sepsis due to MRSA endocarditis/bacteremia, with mobile tricuspid valve mass Septic arthritis L shoulder  Septic pulmonary emboli Opiate abuse disorder Tobacco use Pain/agitation/delirium- think we overshot sedation plan yesterday, seems to have pretty strong response to seroquel.  I also think the shoulder washout created more global inflammation causing worsening septic encephalopathy.  See adjustments below Hypoglycemia due to poor PO- not helping encephalopathy Tachypnea- driven by ongoing severe sepsis and partially by metabolic acidosis  - lorazepam 1mg BID to 0.5mg BID, xanax 0.5mg TID PRN breakthrough - Dilaudid 0.5mg PRN severe pain, oxycodone 5-10 PRN moderate pain - Standing tylenol and toradol - Seroquel 50mg BID to 25mg qHS - Encourage day/night cycles - Continue standing toradol, tylenol (liver injury has resolved) - D5 bicarb gtt x 1 day to help with both sugars and WOB from metabolic acidosis  Abx/source control per ID  Will transfer to stepdown and have TRH pick   back up starting 5/24  Dan Zarin Hagmann MD PCCM   

## 2022-02-11 NOTE — Plan of Care (Signed)
  Problem: Nutrition: Goal: Adequate nutrition will be maintained Outcome: Progressing   Problem: Pain Managment: Goal: General experience of comfort will improve Outcome: Progressing   Problem: Safety: Goal: Ability to remain free from injury will improve Outcome: Progressing   Problem: Clinical Measurements: Goal: Diagnostic test results will improve Outcome: Progressing Goal: Signs and symptoms of infection will decrease Outcome: Progressing

## 2022-02-11 NOTE — Transfer of Care (Signed)
Immediate Anesthesia Transfer of Care Note  Patient: Carol Rivera  Procedure(s) Performed: TRANSESOPHAGEAL ECHOCARDIOGRAM (TEE)  Patient Location: PACU and Endoscopy Unit  Anesthesia Type:MAC  Level of Consciousness: obtunded  Airway & Oxygen Therapy: Patient Spontanous Breathing and Patient connected to nasal cannula oxygen  Post-op Assessment: Report given to RN and Post -op Vital signs reviewed and stable  Post vital signs: Reviewed and stable  Last Vitals:  Vitals Value Taken Time  BP 112/62 02/11/22 1002  Temp 36.4 C 02/11/22 1002  Pulse 98 02/11/22 1003  Resp 43 02/11/22 1003  SpO2 100 % 02/11/22 1003  Vitals shown include unvalidated device data.  Last Pain:  Vitals:   02/11/22 1002  TempSrc: Oral  PainSc: Asleep      Patients Stated Pain Goal: 2 (97/58/83 2549)  Complications: No notable events documented.

## 2022-02-11 NOTE — Progress Notes (Addendum)
NAME:  Carol Rivera, MRN:  [id number], DOB:  1991/04/13, LOS: 5 ADMISSION DATE:  02/06/2022, CONSULTATION DATE:  02/08/22 REFERRING MD:  Aline August, MD CHIEF COMPLAINT:  Endocarditis   History of Present Illness:  Carol Rivera is a 31 year old woman with history of IV drug use who was admitted 02/06/22 with fever, left shoulder pain and general malaise for a few days prior to admission. 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 She has septic pulmonary emboli noted on CT Chest scan 02/07/22. She has MRI of left shoulder pending and MRI Spine without obvious abscess/phlegmon.   PCCM consulted today for increasing respiratory rate and altered mentation.   Patient sitting up in bed taking oral medications at time of evaluation. She complains of diffuse chest and back pain along with left shoulder pain.   Pertinent  Medical History  Asthma IV drug use   Significant Hospital Events: Including procedures, antibiotic start and stop dates in addition to other pertinent events   5/18 admitted to hospital for MRSA endocarditis/bacteremia 5/20 PCCM consulted for evaluation, and did not require ICU level care so CCM signed off. Moved to [facility], on [facility] service  5/21 PCCM consulted again  5/22 L shoulder washout, extubated in ICU bc wound not wake up much, had neg head CT 5/23 waking up more but still sleepy  Interim History / Subjective:  No events. Remains tachypneic/tachycardic Now awake and following commands  Objective   Blood pressure 121/78, pulse (!) 120, temperature 99.3 F (37.4 C), temperature source Oral, resp. rate (!) 32, height 5' (1.524 m), weight 64.7 kg, SpO2 98 %.    Vent Mode: PSV;CPAP FiO2 (%):  [40 %] 40 % Set Rate:  [16 bmp] 16 bmp Vt Set:  [360 mL] 360 mL PEEP:  [5 cmH20] 5 cmH20 Pressure Support:  [8 cmH20] 8 cmH20 Plateau Pressure:  [25 cmH20] 25 cmH20   Intake/Output Summary (Last 24 hours) at  02/11/2022 0750 Last data filed at 02/11/2022 0600 Gross per 24 hour  Intake 13778.55 ml  Output 2095 ml  Net 11683.55 ml    Filed Weights   02/09/22 0317 02/10/22 0500 02/11/22 0600  Weight: 61.2 kg 63.5 kg 64.7 kg   Examination: Ill appearing tachypneic More sleepy than yesterday L shoulder dressed with some bloody strikethrough Ext warm Moves all 4 ext to command Oriented per nursing  WBC up Having hypoglycemia and mild stable nongap acidosis  Resolved Hospital Problem list     Assessment & Plan:    Sepsis due to MRSA endocarditis/bacteremia, with mobile tricuspid valve mass Septic arthritis L shoulder  Septic pulmonary emboli Opiate abuse disorder Tobacco use Pain/agitation/delirium- think we overshot sedation plan yesterday, seems to have pretty strong response to seroquel.  I also think the shoulder washout created more global inflammation causing worsening septic encephalopathy.  See adjustments below Hypoglycemia due to poor PO- not helping encephalopathy Tachypnea- driven by ongoing severe sepsis and partially by metabolic acidosis  - lorazepam 1mg  BID to 0.5mg  BID, xanax 0.5mg  TID PRN breakthrough - Dilaudid 0.5mg  PRN severe pain, oxycodone 5-10 PRN moderate pain - Standing tylenol and toradol - Seroquel 50mg  BID to 25mg  qHS - Encourage day/night cycles - Continue standing toradol, tylenol (liver injury has resolved) - D5 bicarb gtt x 1 day to help with both sugars and WOB from metabolic acidosis  Abx/source control per ID  Will transfer to stepdown and have TRH pick  back up starting 5/24  Erskine Emery MD PCCM

## 2022-02-11 NOTE — Anesthesia Preprocedure Evaluation (Addendum)
Anesthesia Evaluation  Patient identified by MRN, date of birth, ID bandGeneral Assessment Comment:Somnolent, responds to painful stimuli  Reviewed: Allergy & Precautions, Patient's Chart, lab work & pertinent test results  Airway Mallampati: II  TM Distance: >3 FB Neck ROM: Full    Dental  (+) Teeth Intact   Pulmonary asthma , Current Smoker,     + decreased breath sounds      Cardiovascular  Rhythm:Regular Rate:Tachycardia  TV endocarditis    Neuro/Psych negative neurological ROS  negative psych ROS   GI/Hepatic negative GI ROS, (+)     substance abuse  IV drug use,   Endo/Other  negative endocrine ROS  Renal/GU negative Renal ROS  negative genitourinary   Musculoskeletal  (+) Arthritis , Osteoarthritis,  Septic arthritis    Abdominal Normal abdominal exam  (+)   Peds  Hematology negative hematology ROS (+)   Anesthesia Other Findings   Reproductive/Obstetrics                            Anesthesia Physical  Anesthesia Plan  ASA: 4  Anesthesia Plan: MAC   Post-op Pain Management:    Induction: Intravenous  PONV Risk Score and Plan: 1 and Treatment may vary due to age or medical condition and Propofol infusion  Airway Management Planned: Simple Face Mask, Natural Airway and Nasal Cannula  Additional Equipment: None  Intra-op Plan:   Post-operative Plan:   Informed Consent: I have reviewed the patients History and Physical, chart, labs and discussed the procedure including the risks, benefits and alternatives for the proposed anesthesia with the patient or authorized representative who has indicated his/her understanding and acceptance.     Dental advisory given  Plan Discussed with: CRNA  Anesthesia Plan Comments: (Lab Results      Component                Value               Date                      WBC                      32.2 (H)            02/11/2022                 HGB                      9.5 (L)             02/11/2022                HCT                      27.5 (L)            02/11/2022                MCV                      72.2 (L)            02/11/2022                PLT                      159  02/11/2022           Lab Results      Component                Value               Date                      NA                       137                 02/11/2022                K                        3.4 (L)             02/11/2022                CO2                      19 (L)              02/11/2022                GLUCOSE                  86                  02/11/2022                BUN                      18                  02/11/2022                CREATININE               0.70                02/11/2022                CALCIUM                  7.1 (L)             02/11/2022                GFRNONAA                 >60                 02/11/2022              ECHO 05/23: 1. Left ventricular ejection fraction, by estimation, is 60 to 65%. The  left ventricle has normal function. The left ventricle has no regional  wall motion abnormalities. Left ventricular diastolic parameters were  normal.  2. Right ventricular systolic function is normal. The right ventricular  size is normal.  3. The mitral valve is normal in structure. No evidence of mitral valve  regurgitation. No evidence of mitral stenosis.  4. Likely vegetation on the TV However appears on the ventricular side of  the valve Suggest TEE to further evaluate Large mobile mass appers  attached to the TV chords.  5. The aortic valve is normal in structure. Aortic  valve regurgitation is  not visualized. No aortic stenosis is present.  6. The inferior vena cava is normal in size with greater than 50%  respiratory variability, suggesting right atrial pressure of 3 mmHg. )       Anesthesia Quick Evaluation

## 2022-02-11 NOTE — Progress Notes (Signed)
  Echocardiogram Echocardiogram Transesophageal has been performed.  Gerda Diss 02/11/2022, 10:29 AM

## 2022-02-12 ENCOUNTER — Inpatient Hospital Stay (HOSPITAL_COMMUNITY): Payer: Medicaid Other

## 2022-02-12 ENCOUNTER — Encounter (HOSPITAL_COMMUNITY): Payer: Self-pay | Admitting: Cardiovascular Disease

## 2022-02-12 DIAGNOSIS — B9562 Methicillin resistant Staphylococcus aureus infection as the cause of diseases classified elsewhere: Secondary | ICD-10-CM | POA: Diagnosis not present

## 2022-02-12 DIAGNOSIS — E871 Hypo-osmolality and hyponatremia: Secondary | ICD-10-CM | POA: Diagnosis not present

## 2022-02-12 DIAGNOSIS — R7881 Bacteremia: Secondary | ICD-10-CM | POA: Diagnosis not present

## 2022-02-12 DIAGNOSIS — R7989 Other specified abnormal findings of blood chemistry: Secondary | ICD-10-CM | POA: Diagnosis not present

## 2022-02-12 LAB — CBC
HCT: 25.2 % — ABNORMAL LOW (ref 36.0–46.0)
Hemoglobin: 8.3 g/dL — ABNORMAL LOW (ref 12.0–15.0)
MCH: 24.3 pg — ABNORMAL LOW (ref 26.0–34.0)
MCHC: 32.9 g/dL (ref 30.0–36.0)
MCV: 73.7 fL — ABNORMAL LOW (ref 80.0–100.0)
Platelets: 212 10*3/uL (ref 150–400)
RBC: 3.42 MIL/uL — ABNORMAL LOW (ref 3.87–5.11)
RDW: 23 % — ABNORMAL HIGH (ref 11.5–15.5)
WBC: 21 10*3/uL — ABNORMAL HIGH (ref 4.0–10.5)
nRBC: 0 % (ref 0.0–0.2)

## 2022-02-12 LAB — GLUCOSE, CAPILLARY
Glucose-Capillary: 164 mg/dL — ABNORMAL HIGH (ref 70–99)
Glucose-Capillary: 102 mg/dL — ABNORMAL HIGH (ref 70–99)
Glucose-Capillary: 129 mg/dL — ABNORMAL HIGH (ref 70–99)

## 2022-02-12 LAB — COMPREHENSIVE METABOLIC PANEL
ALT: 36 U/L (ref 0–44)
AST: 34 U/L (ref 15–41)
Albumin: <1.5 g/dL — ABNORMAL LOW (ref 3.5–5.0)
Alkaline Phosphatase: 107 U/L (ref 38–126)
Anion gap: 5 (ref 5–15)
BUN: 24 mg/dL — ABNORMAL HIGH (ref 6–20)
CO2: 22 mmol/L (ref 22–32)
Calcium: 6.9 mg/dL — ABNORMAL LOW (ref 8.9–10.3)
Chloride: 109 mmol/L (ref 98–111)
Creatinine, Ser: 0.98 mg/dL (ref 0.44–1.00)
GFR, Estimated: >60 mL/min (ref >60)
Glucose, Bld: 120 mg/dL — ABNORMAL HIGH (ref 70–99)
Potassium: 3.6 mmol/L (ref 3.5–5.1)
Sodium: 136 mmol/L (ref 135–145)
Total Bilirubin: 2 mg/dL — ABNORMAL HIGH (ref 0.3–1.2)
Total Protein: 5.1 g/dL — ABNORMAL LOW (ref 6.5–8.1)

## 2022-02-12 LAB — MAGNESIUM: Magnesium: 2.1 mg/dL (ref 1.7–2.4)

## 2022-02-12 IMAGING — DX DG CHEST 1V PORT
1 series · 1 of 1 positions shown · non-contrast
Comparison: [DATE].

CLINICAL DATA: Pneumonia.

EXAM:
PORTABLE CHEST 1 VIEW

[chest]
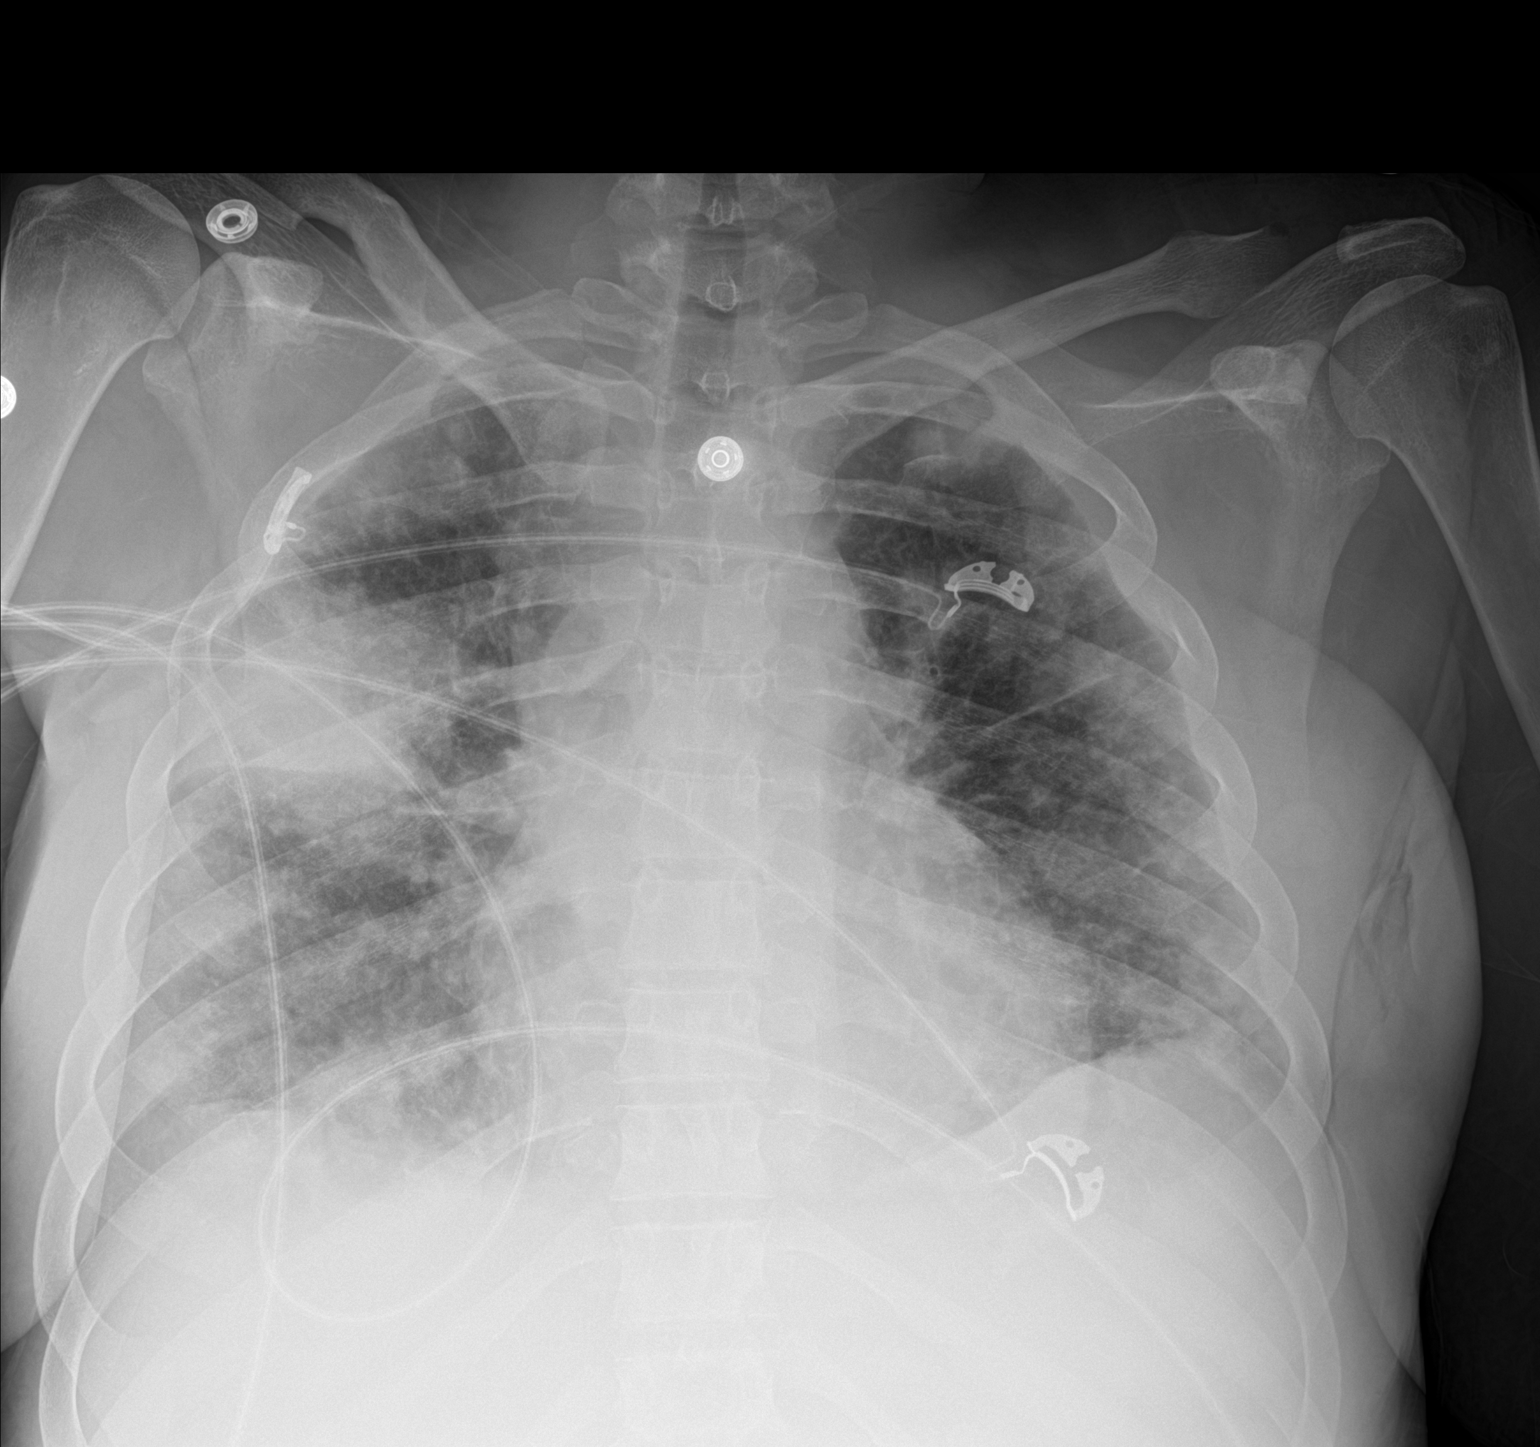

[1 of 1 positions shown; findings below may reference images not displayed]

FINDINGS: Stable cardiomediastinal silhouette. Increased right upper lobe
airspace opacity is noted most consistent with pneumonia. Smaller
patchy opacities are noted throughout both lungs also most
consistent with pneumonia. Bony thorax is unremarkable.
IMPRESSION: Findings consistent with multifocal pneumonia bilaterally, with
significantly increased opacity seen in right upper lobe consistent
with worsening focal infiltrate.

## 2022-02-12 MED ORDER — LORAZEPAM 2 MG/ML IJ SOLN
1.0000 mg | INTRAMUSCULAR | Status: AC | PRN
Start: 2022-02-12 — End: 2022-02-13
  Administered 2022-02-13: 1 mg via INTRAVENOUS
  Filled 2022-02-12: qty 1

## 2022-02-12 NOTE — Progress Notes (Signed)
PHARMACY - PHYSICIAN COMMUNICATION CRITICAL VALUE ALERT - BLOOD CULTURE IDENTIFICATION (BCID)  Carol Rivera is an 30 y.o. female who presented to [facility] on 02/06/2022 with a chief complaint of bacteremia.   Assessment:  1/4 bottles (aerobic only) staph aureus, mecA resistance detected.   Name of physician (or Provider) Contacted: Rickey Barbara, MD  ID rounding team   Current antibiotics: ceftaroline and daptomycin   Changes to prescribed antibiotics recommended:  Patient is on the appropriate antibiotics. Continue therapy.   Results for orders placed or performed during the hospital encounter of 02/06/22  Blood Culture ID Panel (Reflexed) (Collected: 02/06/2022  8:20 PM)  Result Value Ref Range   Enterococcus faecalis NOT DETECTED NOT DETECTED   Enterococcus Faecium NOT DETECTED NOT DETECTED   Listeria monocytogenes NOT DETECTED NOT DETECTED   Staphylococcus species DETECTED (A) NOT DETECTED   Staphylococcus aureus (BCID) DETECTED (A) NOT DETECTED   Staphylococcus epidermidis NOT DETECTED NOT DETECTED   Staphylococcus lugdunensis NOT DETECTED NOT DETECTED   Streptococcus species NOT DETECTED NOT DETECTED   Streptococcus agalactiae NOT DETECTED NOT DETECTED   Streptococcus pneumoniae NOT DETECTED NOT DETECTED   Streptococcus pyogenes NOT DETECTED NOT DETECTED   A.calcoaceticus-baumannii NOT DETECTED NOT DETECTED   Bacteroides fragilis NOT DETECTED NOT DETECTED   Enterobacterales NOT DETECTED NOT DETECTED   Enterobacter cloacae complex NOT DETECTED NOT DETECTED   Escherichia coli NOT DETECTED NOT DETECTED   Klebsiella aerogenes NOT DETECTED NOT DETECTED   Klebsiella oxytoca NOT DETECTED NOT DETECTED   Klebsiella pneumoniae NOT DETECTED NOT DETECTED   Proteus species NOT DETECTED NOT DETECTED   Salmonella species NOT DETECTED NOT DETECTED   Serratia marcescens NOT DETECTED NOT DETECTED   Haemophilus influenzae NOT DETECTED NOT DETECTED   Neisseria meningitidis NOT  DETECTED NOT DETECTED   Pseudomonas aeruginosa NOT DETECTED NOT DETECTED   Stenotrophomonas maltophilia NOT DETECTED NOT DETECTED   Candida albicans NOT DETECTED NOT DETECTED   Candida auris NOT DETECTED NOT DETECTED   Candida glabrata NOT DETECTED NOT DETECTED   Candida krusei NOT DETECTED NOT DETECTED   Candida parapsilosis NOT DETECTED NOT DETECTED   Candida tropicalis NOT DETECTED NOT DETECTED   Cryptococcus neoformans/gattii NOT DETECTED NOT DETECTED   Meth resistant mecA/C and MREJ DETECTED (A) NOT DETECTED    [name] 02/12/2022  2:04 PM

## 2022-02-12 NOTE — Progress Notes (Signed)
Progress Note   Patient: Carol Rivera [id number]:[id number] DOB: 1991/05/06 DOA: 02/06/2022     6 DOS: the patient was seen and examined on 02/12/2022   Brief hospital course: 31 y.o. F W/ history significant for childhood asthma and intravenous drug abuse presented with fever, chest pain, cough and left shoulder pain. In ED-febrile, tachycardic.  Chest x-ray and left shoulder x-ray was negative for acute findings.  Sodium 128, alkaline phosphatase 163, AST 257, ALT 454, total bilirubin of 6.9 with WBCs of 17,900 and lactic acid of 4.  COVID-19 and influenza testing negative.  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  MRI of the spine without obvious abscess/phlegmon or osseous involvement.subsequently transferred from [facility] to [facility] 5/22 to consider TEE and CT surgery evaluation.     Assessment and Plan: No notes have been filed under this hospital service. Service: Hospitalist   1. Sepsis secondary to MRSA trucuspid endocarditis and bacteremia - IV drug user presenting with fever, pleuritic chest pain, productive cough, and severe atraumatic left shoulder pain and found to have WBC 17,900 with lactate >4, fever, tachycardia, and stable BP  -Pt found to have large tricuspid vegetation, not accessible via angiovac per CTS on 5/22, also not a candidate for valve replacement given recent IV drug use -Seen by Orthopedic Surgery for septic AC joints, now s/p L shoulder AC resection and extensive debridement on 5/22 -ID following. Recs to cont on daptomycin with teflaro -Repeat blood cx pending for 5/25 -Serial chest imaging also demonstrates worsening PNA, likely embolic  -repeat cbc and bmet in AM -Recently transferred to ICU for monitoring for resp rate and  mentation, now back to [place]   2. Pleuritic chest pain  - Pt complains of pleuritic chest pain and productive cough  - Findings of PNA from likely embolic source   3. Left shoulder pain secondary to infected [facility] - Severe atraumatic left shoulder pain in IV drug user with fever  - Per above, pt with significant infection to [facility], now s/p debridement per Orthopedic Surgery on 5/22   4. Elevated LFTs  - Alk phos 163, AST 257, ALT 454, and t bili 6.9 on admission with INR 1.3  - Abd [place] from 5/19 with evidence of hepatic edema with trace ascites and partially contracted GB with edematous wall and sludge, more suggestive of GB edema -LFT's trending down -Repeat LFT in AM   5. Hyponatremia  - Serum sodium 128 on admission in setting of hypovolemia  - Normalized with IVF -repeat bmet in AM        Subjective: Very tired this AM, difficult to assess. Pt was able to report not feeling SOB  Physical Exam: Vitals:   02/12/22 0800 02/12/22 0900 02/12/22 1000 02/12/22 1200  BP: (!) 100/55  110/77   Pulse:      Resp: (!) 21 (!) 41 19   Temp: 98.4 F (36.9 C)   98.2 F (36.8 C)  TempSrc: Axillary   Oral  SpO2:      Weight:      Height:       General exam: Asleep, laying in bed, in nad Respiratory system: Increased resp rate, no wheezing Cardiovascular system: regular rate, s1, s2 Gastrointestinal system: Soft, nondistended, positive BS Central nervous system: CN2-12 grossly intact, strength intact Extremities: Perfused, no clubbing Skin: Normal  skin turgor, no notable skin lesions seen Psychiatry: Difficult to assess given mentation  Data Reviewed:  Labs reviewed: Cr 0.98, alb <1.5, AST 34, ALT 36, TB 2.0, WBC 21  Family Communication: Pt in room, family not at bedside  Disposition: Status is: Inpatient Remains inpatient appropriate because: Severity of illness  Planned Discharge Destination:  Unclear at this time     Author: Marylu Lund, MD 02/12/2022 12:04 PM  For on  call review [email or web address].

## 2022-02-12 NOTE — Progress Notes (Signed)
  Subjective: Patient is a 31 year old female who is POD 2 s/p left shoulder distal clavicle excision with irrigation and debridement of AC joint.  She is somewhat sedated on exam today but she is able to participate and states that her shoulder pain is significantly improved compared with prior to surgery.  Denies any fevers or chills.  She did pull her dressings off yesterday evening and this was replaced with ABD and tape.   Objective: Vital signs in last 24 hours: Temp:  [97.2 F (36.2 C)-98.8 F (37.1 C)] 98.4 F (36.9 C) (05/24 0800) Pulse Rate:  [90-119] 107 (05/24 0600) Resp:  [25-51] 26 (05/24 0600) BP: (94-124)/(60-103) 111/78 (05/24 0600) SpO2:  [92 %-100 %] 97 % (05/24 0600) Weight:  [64.7 kg-69.6 kg] 69.6 kg (05/24 0600)  Intake/Output from previous day: 05/23 0701 - 05/24 0700 In: 3906.9 [P.O.:596; I.V.:2644.3; IV Piggyback:666.6] Out: 390 [Urine:390] Intake/Output this shift: No intake/output data recorded.  Exam:  Dressing intact.  Dressing was removed to reveal sutures intact with no surrounding erythema around the incision.  Penrose drain was removed yesterday evening.  There is no active drainage.  Labs: Recent Labs    02/11/22 0136 02/12/22 0224  HGB 9.5* 8.3*   Recent Labs    02/11/22 0136 02/12/22 0224  WBC 32.2* 21.0*  RBC 3.81* 3.42*  HCT 27.5* 25.2*  PLT 159 212   Recent Labs    02/11/22 0618 02/12/22 0224  NA 137 136  K 3.4* 3.6  CL 110 109  CO2 19* 22  BUN 18 24*  CREATININE 0.70 0.98  GLUCOSE 86 120*  CALCIUM 7.1* 6.9*   No results for input(s): LABPT, INR in the last 72 hours.  Assessment/Plan: Patient is POD 2 s/p left shoulder AC joint I&D with distal clavicle excision.  Shoulder pain is much improved.  Incision looks to be healing well.  Expect some drainage as the incision was closed loosely.  Intraoperative cultures are growing Staph aureus with sensitivities pending.  Plan to redress the incision this morning.  Plan for  suture removal around 10 days postoperatively.   Joycie Peek Milad Bublitz 02/12/2022, 8:23 AM

## 2022-02-12 NOTE — Progress Notes (Signed)
[facility]  Date of Admission:  02/06/2022      Total days of antibiotics 7  Daptomycin + Ceftaroline 5/21 >> current          ASSESSMENT: Carol Rivera is a 31 y.o. 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 She has had ongoing complaints of back pain (midback and up to neck). Non-contrasted MRI study done earlier in admission was without concern for acute vertebral infection, however may have been too early to see. With persistent bacteremia will repeat C- and T-spine with and without contrast. D/W Dr. Rhona Leavens and will see if we can coordinate anxiety management for better quality study. Continue Daptomycin + Ceftaroline combination. Repeat blood cultures given 5/19 still growing 1/4 bottles (alerted today).  Non-operative candidate for valve replacement.   Person with Injection Drug Use - will repeat Hep c quant rna since last specimen was not sufficient to run test to clarify chronic vs cleared infection. RPR. HIV was non-reactive. Hep B non reactive.    PLAN: Follow OR specimens to maturity from shoulder I&D Repeat blood cultures (growing again from 5/19) T and C spine MRI w/ and w/o contrast to re-eval vertebral infection    Principal Problem:   Sepsis (HCC) Active Problems:   Hyponatremia   Elevated LFTs   IVDU (intravenous drug user)   Left shoulder pain   Chest pain, pleuritic   MRSA bacteremia   Septic pulmonary embolism without acute cor pulmonale (HCC)   Endocarditis of tricuspid valve   Staphylococcal arthritis of left shoulder (HCC)   Osteomyelitis (HCC)    acetaminophen  650 mg Oral Q8H   Chlorhexidine Gluconate Cloth  6 each Topical Daily   clonazePAM  0.5 mg Oral BID   docusate sodium  100 mg Oral BID   enoxaparin (LOVENOX) injection  40 mg Subcutaneous Q24H   ketorolac   30 mg Intravenous Q8H   mouth rinse  15 mL Mouth Rinse BID   metoprolol tartrate  12.5 mg Oral BID   mupirocin ointment   Nasal BID   nicotine  14 mg Transdermal Daily   QUEtiapine  25 mg Oral QHS   sodium chloride flush  3 mL Intravenous Q12H    SUBJECTIVE: She has worsening upper back pain and neck pain since admission and quite uncomfortable. Denies any neurologic/muscular symptoms and reports intact sensation to all extremities and intact bowel/bladder evacuation.   No fevers. Shoulder not bothering her today. She is looking forward to a visit from mom and her 34 yo daughter.    Review of Systems: Review of Systems  Constitutional:  Negative for chills and fever.  Musculoskeletal:  Positive for back pain and neck pain.   Allergies  Allergen Reactions   Doxycycline Other (See Comments)    REACTION: Joint Pain    OBJECTIVE: Vitals:   02/12/22 1000 02/12/22 1100 02/12/22 1200 02/12/22 1400  BP: 110/77   (!) 120/92  Pulse:      Resp: 19 (!) 23  (!) 25  Temp:   98.2 F (36.8 C)   TempSrc:   Oral   SpO2:      Weight:      Height:       Body mass index is 29.97 kg/m.    Physical Exam Vitals reviewed.  Constitutional:      Appearance:  She is not ill-appearing.  Cardiovascular:     Rate and Rhythm: Normal rate and regular rhythm.  Pulmonary:     Effort: Pulmonary effort is normal.  Abdominal:     Palpations: Abdomen is soft.  Neurological:     Mental Status: She is alert.    Lab Results Lab Results  Component Value Date   WBC 21.0 (H) 02/12/2022   HGB 8.3 (L) 02/12/2022   HCT 25.2 (L) 02/12/2022   MCV 73.7 (L) 02/12/2022   PLT 212 02/12/2022    Lab Results  Component Value Date   CREATININE 0.98 02/12/2022   BUN 24 (H) 02/12/2022   NA 136 02/12/2022   K 3.6 02/12/2022   CL 109 02/12/2022   CO2 22 02/12/2022    Lab Results  Component Value Date   ALT 36 02/12/2022   AST 34 02/12/2022   ALKPHOS 107 02/12/2022   BILITOT 2.0 (H) 02/12/2022      Microbiology: Recent Results (from the past 240 hour(s))  Blood culture (routine x 2)     Status: Abnormal   Collection Time: 02/06/22  8:15 PM   Specimen: BLOOD  Result Value Ref Range Status   Specimen Description   Final    BLOOD RIGHT ANTECUBITAL Performed at [facility], [address] [place], [place] [address]    Special Requests   Final    BOTTLES DRAWN AEROBIC ONLY Blood Culture results may not be optimal due to an inadequate volume of blood received in culture bottles Performed at [facility], [address] [place], [place] [address]    Culture  Setup Time   Final    GRAM POSITIVE COCCI IN CLUSTERS AEROBIC BOTTLE ONLY    Culture (A)  Final    STAPHYLOCOCCUS AUREUS SUSCEPTIBILITIES PERFORMED ON PREVIOUS CULTURE WITHIN THE LAST 5 DAYS. Performed at [facility], [address] [place], [place] [address]    Report Status 02/09/2022 FINAL  Final  Blood culture (routine x 2)     Status: Abnormal   Collection Time: 02/06/22  8:20 PM   Specimen: BLOOD  Result Value Ref Range Status   Specimen Description   Final    BLOOD BLOOD RIGHT HAND Performed at [facility], [address] [place], [place] [address]    Special Requests   Final    BOTTLES DRAWN AEROBIC AND ANAEROBIC Blood Culture results may not be optimal due to an excessive volume of blood received in culture bottles Performed at [facility], [address] [place], [place] [address]    Culture  Setup Time   Final    GRAM POSITIVE COCCI IN CLUSTERS IN BOTH AEROBIC AND ANAEROBIC BOTTLES CRITICAL RESULT CALLED TO, READ BACK BY AND VERIFIED WITH: Oneta RackHARMD M BELL [id number] AT 1205 BY CM Performed at [facility], [address] [place], [place] [address]    Culture METHICILLIN RESISTANT STAPHYLOCOCCUS AUREUS (A)  Final   Report Status 02/09/2022 FINAL  Final   Organism ID, Bacteria METHICILLIN RESISTANT STAPHYLOCOCCUS  AUREUS  Final      Susceptibility   Methicillin resistant staphylococcus aureus - MIC*    CIPROFLOXACIN >=8 RESISTANT Resistant     ERYTHROMYCIN >=8 RESISTANT Resistant     GENTAMICIN <=0.5 SENSITIVE Sensitive     OXACILLIN >=4 RESISTANT Resistant     TETRACYCLINE <=1 SENSITIVE Sensitive     VANCOMYCIN 1 SENSITIVE Sensitive     TRIMETH/SULFA 160 RESISTANT Resistant  CLINDAMYCIN <=0.25 SENSITIVE Sensitive     RIFAMPIN <=0.5 SENSITIVE Sensitive     Inducible Clindamycin NEGATIVE Sensitive     * METHICILLIN RESISTANT STAPHYLOCOCCUS AUREUS  Blood Culture ID Panel (Reflexed)     Status: Abnormal   Collection Time: 02/06/22  8:20 PM  Result Value Ref Range Status   Enterococcus faecalis NOT DETECTED NOT DETECTED Final   Enterococcus Faecium NOT DETECTED NOT DETECTED Final   Listeria monocytogenes NOT DETECTED NOT DETECTED Final   Staphylococcus species DETECTED (A) NOT DETECTED Final    Comment: CRITICAL RESULT CALLED TO, READ BACK BY AND VERIFIED WITH: PHARMD M BELL [id number] AT 1205 BY CM    Staphylococcus aureus (BCID) DETECTED (A) NOT DETECTED Final    Comment: Methicillin (oxacillin)-resistant Staphylococcus aureus (MRSA). MRSA is predictably resistant to beta-lactam antibiotics (except ceftaroline). Preferred therapy is vancomycin unless clinically contraindicated. Patient requires contact precautions if  hospitalized. CRITICAL RESULT CALLED TO, READ BACK BY AND VERIFIED WITH: PHARMD M BELL [id number] AT 1205 BY CM    Staphylococcus epidermidis NOT DETECTED NOT DETECTED Final   Staphylococcus lugdunensis NOT DETECTED NOT DETECTED Final   Streptococcus species NOT DETECTED NOT DETECTED Final   Streptococcus agalactiae NOT DETECTED NOT DETECTED Final   Streptococcus pneumoniae NOT DETECTED NOT DETECTED Final   Streptococcus pyogenes NOT DETECTED NOT DETECTED Final   A.calcoaceticus-baumannii NOT DETECTED NOT DETECTED Final   Bacteroides fragilis NOT DETECTED NOT DETECTED Final    Enterobacterales NOT DETECTED NOT DETECTED Final   Enterobacter cloacae complex NOT DETECTED NOT DETECTED Final   Escherichia coli NOT DETECTED NOT DETECTED Final   Klebsiella aerogenes NOT DETECTED NOT DETECTED Final   Klebsiella oxytoca NOT DETECTED NOT DETECTED Final   Klebsiella pneumoniae NOT DETECTED NOT DETECTED Final   Proteus species NOT DETECTED NOT DETECTED Final   Salmonella species NOT DETECTED NOT DETECTED Final   Serratia marcescens NOT DETECTED NOT DETECTED Final   Haemophilus influenzae NOT DETECTED NOT DETECTED Final   Neisseria meningitidis NOT DETECTED NOT DETECTED Final   Pseudomonas aeruginosa NOT DETECTED NOT DETECTED Final   Stenotrophomonas maltophilia NOT DETECTED NOT DETECTED Final   Candida albicans NOT DETECTED NOT DETECTED Final   Candida auris NOT DETECTED NOT DETECTED Final   Candida glabrata NOT DETECTED NOT DETECTED Final   Candida krusei NOT DETECTED NOT DETECTED Final   Candida parapsilosis NOT DETECTED NOT DETECTED Final   Candida tropicalis NOT DETECTED NOT DETECTED Final   Cryptococcus neoformans/gattii NOT DETECTED NOT DETECTED Final   Meth resistant mecA/C and MREJ DETECTED (A) NOT DETECTED Final    Comment: CRITICAL RESULT CALLED TO, READ BACK BY AND VERIFIED WITH: Oneta Rack [id number] AT 1205 BY CM Performed at [facility], [address], [place], [place] [address]   Resp Panel by RT-PCR (Flu A&B, Covid) Peripheral     Status: None   Collection Time: 02/06/22  8:38 PM   Specimen: Peripheral; Nasopharyngeal(NP) swabs in vial transport medium  Result Value Ref Range Status   SARS Coronavirus 2 by RT PCR NEGATIVE NEGATIVE Final    Comment: (NOTE) SARS-CoV-2 target nucleic acids are NOT DETECTED.  The SARS-CoV-2 RNA is generally detectable in upper respiratory specimens during the acute phase of infection. The lowest concentration of SARS-CoV-2 viral copies this assay can detect is 138 copies/mL. A negative result does not preclude  SARS-Cov-2 infection and should not be used as the sole basis for treatment or other patient management decisions. A negative result may occur with  improper specimen collection/handling, submission of specimen other than nasopharyngeal swab, presence of viral mutation(s) within the areas targeted by this assay, and inadequate number of viral copies(<138 copies/mL). A negative result must be combined with clinical observations, patient history, and epidemiological information. The expected result is Negative.  Fact Sheet for Patients:  BloggerCourse.com  Fact Sheet for Healthcare Providers:  SeriousBroker.it  This test is no t yet approved or cleared by the [place] FDA and  has been authorized for detection and/or diagnosis of SARS-CoV-2 by FDA under an Emergency Use Authorization (EUA). This EUA will remain  in effect (meaning this test can be used) for the duration of the COVID-19 declaration under Section 564(b)(1) of the Act, 21 U.S.C.section 360bbb-3(b)(1), unless the authorization is terminated  or revoked sooner.       Influenza A by PCR NEGATIVE NEGATIVE Final   Influenza B by PCR NEGATIVE NEGATIVE Final    Comment: (NOTE) The Xpert Xpress SARS-CoV-2/FLU/RSV plus assay is intended as an aid in the diagnosis of influenza from Nasopharyngeal swab specimens and should not be used as a sole basis for treatment. Nasal washings and aspirates are unacceptable for Xpert Xpress SARS-CoV-2/FLU/RSV testing.  Fact Sheet for Patients: BloggerCourse.com  Fact Sheet for Healthcare Providers: SeriousBroker.it  This test is not yet approved or cleared by the [place] FDA and has been authorized for detection and/or diagnosis of SARS-CoV-2 by FDA under an Emergency Use Authorization (EUA). This EUA will remain in effect (meaning this test can be used) for the duration of  the COVID-19 declaration under Section 564(b)(1) of the Act, 21 U.S.C. section 360bbb-3(b)(1), unless the authorization is terminated or revoked.  Performed at [facility], [address], [place], [place] [address]   Urine Culture     Status: Abnormal   Collection Time: 02/07/22 12:23 AM   Specimen: In/Out Cath Urine  Result Value Ref Range Status   Specimen Description   Final    IN/OUT CATH URINE Performed at [facility], [address] [place], [place] [address]    Special Requests   Final    NONE Performed at [facility], [address], [place], [place] [address]    Culture (A)  Final    >=100,000 COLONIES/mL METHICILLIN RESISTANT STAPHYLOCOCCUS AUREUS   Report Status 02/09/2022 FINAL  Final   Organism ID, Bacteria METHICILLIN RESISTANT STAPHYLOCOCCUS AUREUS (A)  Final      Susceptibility   Methicillin resistant staphylococcus aureus - MIC*    CIPROFLOXACIN >=8 RESISTANT Resistant     GENTAMICIN <=0.5 SENSITIVE Sensitive     NITROFURANTOIN <=16 SENSITIVE Sensitive     OXACILLIN >=4 RESISTANT Resistant     TETRACYCLINE <=1 SENSITIVE Sensitive     VANCOMYCIN 1 SENSITIVE Sensitive     TRIMETH/SULFA >=320 RESISTANT Resistant     CLINDAMYCIN <=0.25 SENSITIVE Sensitive     RIFAMPIN <=0.5 SENSITIVE Sensitive     Inducible Clindamycin NEGATIVE Sensitive     * >=100,000 COLONIES/mL METHICILLIN RESISTANT STAPHYLOCOCCUS AUREUS  MRSA Next Gen by PCR, Nasal     Status: Abnormal   Collection Time: 02/07/22 12:43 AM   Specimen: Nasal Mucosa; Nasal Swab  Result Value Ref Range Status   MRSA by PCR Next Gen DETECTED (A) NOT DETECTED Final    Comment: CRITICAL RESULT CALLED TO, READ BACK BY AND VERIFIED WITH: CALLED TO ZAHOUREK,A AT 9147 ON 02/07/22 BY VAZQUEZ,J (NOTE) The GeneXpert MRSA Assay (FDA approved for NASAL specimens only), is one  component of a comprehensive MRSA colonization surveillance program. It is not  intended to diagnose MRSA infection nor to guide or monitor treatment for MRSA infections. Test performance is not FDA approved in patients less than 1 years old. Performed at [facility], [address], [place], [place] [address]   Culture, blood (single) w Reflex to ID Panel     Status: Abnormal   Collection Time: 02/07/22  6:58 AM   Specimen: BLOOD  Result Value Ref Range Status   Specimen Description   Final    BLOOD BLOOD LEFT WRIST Performed at [facility], [address], [place], [place] [address]    Special Requests   Final    IN PEDIATRIC BOTTLE Blood Culture adequate volume Performed at [facility], [address] [place], [place] [address]    Culture  Setup Time   Final    GRAM POSITIVE COCCI IN CLUSTERS IN PEDIATRIC BOTTLE CRITICAL VALUE NOTED.  VALUE IS CONSISTENT WITH PREVIOUSLY REPORTED AND CALLED VALUE.    Culture (A)  Final    STAPHYLOCOCCUS AUREUS SUSCEPTIBILITIES PERFORMED ON PREVIOUS CULTURE WITHIN THE LAST 5 DAYS. Performed at [facility], [address] [place], [place] [address]    Report Status 02/09/2022 FINAL  Final  Culture, blood (Routine X 2) w Reflex to ID Panel     Status: Abnormal   Collection Time: 02/08/22  2:58 AM   Specimen: BLOOD  Result Value Ref Range Status   Specimen Description   Final    BLOOD BLOOD LEFT HAND Performed at [facility], [address], [place], [place] [address]    Special Requests   Final    IN PEDIATRIC BOTTLE Blood Culture adequate volume Performed at [facility], [address] [place], [place] [address]    Culture  Setup Time   Final    GRAM POSITIVE COCCI IN CLUSTERS AEROBIC BOTTLE ONLY CRITICAL VALUE NOTED.  VALUE IS CONSISTENT WITH PREVIOUSLY REPORTED AND CALLED VALUE.    Culture (A)  Final    STAPHYLOCOCCUS AUREUS SUSCEPTIBILITIES PERFORMED ON PREVIOUS CULTURE WITHIN THE LAST 5  DAYS. Performed at [facility], [address] [place], [place] [address]    Report Status 02/10/2022 FINAL  Final  Culture, blood (Routine X 2) w Reflex to ID Panel     Status: Abnormal (Preliminary result)   Collection Time: 02/08/22  2:59 AM   Specimen: BLOOD  Result Value Ref Range Status   Specimen Description   Final    BLOOD RIGHT ANTECUBITAL Performed at [facility], [address], [place], [place] [address]    Special Requests   Final    IN PEDIATRIC BOTTLE Blood Culture adequate volume Performed at [facility], [address] [place], [place] [address]    Culture  Setup Time   Final    GRAM POSITIVE COCCI IN CLUSTERS AEROBIC BOTTLE ONLY CRITICAL VALUE NOTED.  VALUE IS CONSISTENT WITH PREVIOUSLY REPORTED AND CALLED VALUE.    Culture (A)  Final    STAPHYLOCOCCUS AUREUS Sent to [facility] for further susceptibility testing. Performed at [facility], [address] [place], [place] [address]    Report Status PENDING  Incomplete  Culture, blood (Routine X 2) w Reflex to ID Panel     Status: None (Preliminary result)   Collection Time: 02/10/22  6:43 AM   Specimen: BLOOD  Result Value Ref Range Status  Specimen Description BLOOD LEFT ANTECUBITAL  Final   Special Requests   Final    BOTTLES DRAWN AEROBIC AND ANAEROBIC Blood Culture adequate volume   Culture  Setup Time   Final    GRAM POSITIVE COCCI IN CLUSTERS IN BOTH AEROBIC AND ANAEROBIC BOTTLES CRITICAL RESULT CALLED TO, READ BACK BY AND VERIFIED WITH: Tandy Gaw [id number] FCP Performed at [facility], [address], [place], [place] [address]    Culture GRAM POSITIVE COCCI  Final   Report Status PENDING  Incomplete  Blood Culture ID Panel (Reflexed)     Status: Abnormal   Collection Time: 02/10/22  6:43 AM  Result Value Ref Range Status   Enterococcus faecalis NOT DETECTED NOT DETECTED Final   Enterococcus Faecium NOT DETECTED NOT DETECTED  Final   Listeria monocytogenes NOT DETECTED NOT DETECTED Final   Staphylococcus species DETECTED (A) NOT DETECTED Final    Comment: CRITICAL RESULT CALLED TO, READ BACK BY AND VERIFIED WITH: PHARMD RACHEL S 1403 [id number] FCP    Staphylococcus aureus (BCID) DETECTED (A) NOT DETECTED Final    Comment: Methicillin (oxacillin)-resistant Staphylococcus aureus (MRSA). MRSA is predictably resistant to beta-lactam antibiotics (except ceftaroline). Preferred therapy is vancomycin unless clinically contraindicated. Patient requires contact precautions if  hospitalized. CRITICAL RESULT CALLED TO, READ BACK BY AND VERIFIED WITH: PHARMD RACHEL S 1403 [id number] FCP    Staphylococcus epidermidis NOT DETECTED NOT DETECTED Final   Staphylococcus lugdunensis NOT DETECTED NOT DETECTED Final   Streptococcus species NOT DETECTED NOT DETECTED Final   Streptococcus agalactiae NOT DETECTED NOT DETECTED Final   Streptococcus pneumoniae NOT DETECTED NOT DETECTED Final   Streptococcus pyogenes NOT DETECTED NOT DETECTED Final   A.calcoaceticus-baumannii NOT DETECTED NOT DETECTED Final   Bacteroides fragilis NOT DETECTED NOT DETECTED Final   Enterobacterales NOT DETECTED NOT DETECTED Final   Enterobacter cloacae complex NOT DETECTED NOT DETECTED Final   Escherichia coli NOT DETECTED NOT DETECTED Final   Klebsiella aerogenes NOT DETECTED NOT DETECTED Final   Klebsiella oxytoca NOT DETECTED NOT DETECTED Final   Klebsiella pneumoniae NOT DETECTED NOT DETECTED Final   Proteus species NOT DETECTED NOT DETECTED Final   Salmonella species NOT DETECTED NOT DETECTED Final   Serratia marcescens NOT DETECTED NOT DETECTED Final   Haemophilus influenzae NOT DETECTED NOT DETECTED Final   Neisseria meningitidis NOT DETECTED NOT DETECTED Final   Pseudomonas aeruginosa NOT DETECTED NOT DETECTED Final   Stenotrophomonas maltophilia NOT DETECTED NOT DETECTED Final   Candida albicans NOT DETECTED NOT DETECTED Final   Candida auris NOT  DETECTED NOT DETECTED Final   Candida glabrata NOT DETECTED NOT DETECTED Final   Candida krusei NOT DETECTED NOT DETECTED Final   Candida parapsilosis NOT DETECTED NOT DETECTED Final   Candida tropicalis NOT DETECTED NOT DETECTED Final   Cryptococcus neoformans/gattii NOT DETECTED NOT DETECTED Final   Meth resistant mecA/C and MREJ DETECTED (A) NOT DETECTED Final    Comment: Performed at [facility], [address], [place], [place] [address]  Culture, blood (Routine X 2) w Reflex to ID Panel     Status: None (Preliminary result)   Collection Time: 02/10/22  6:53 AM   Specimen: BLOOD  Result Value Ref Range Status   Specimen Description BLOOD LEFT ANTECUBITAL  Final   Special Requests   Final    BOTTLES DRAWN AEROBIC AND ANAEROBIC Blood Culture adequate volume   Culture   Final    NO GROWTH 2 DAYS Performed at [facility], [address] [place], [place] [address]    Report Status PENDING  Incomplete  Surgical PCR screen     Status: Abnormal   Collection Time: 02/10/22  9:22 AM   Specimen: Nasal Mucosa; Nasal Swab  Result Value Ref Range Status   MRSA, PCR POSITIVE (A) NEGATIVE Final    Comment: RESULT CALLED TO, READ BACK BY AND VERIFIED WITH: OLVIA MARTIN   DL    Staphylococcus aureus POSITIVE (A) NEGATIVE Final    Comment: (NOTE) The Xpert SA Assay (FDA approved for NASAL specimens in patients 62 years of age and older), is one component of a comprehensive surveillance program. It is not intended to diagnose infection nor to guide or monitor treatment. Performed at [facility], [address] [place], [place] [address]   Aerobic/Anaerobic Culture w Gram Stain (surgical/deep wound)     Status: None (Preliminary result)   Collection Time: 02/10/22 11:31 AM   Specimen: PATH Other; Tissue  Result Value Ref Range Status   Specimen Description WOUND  Final   Special Requests LEFT AC JOINT  Final   Gram Stain   Final    RARE WBC PRESENT,BOTH PMN AND  MONONUCLEAR RARE GRAM POSITIVE COCCI IN CLUSTERS    Culture   Final    FEW STAPHYLOCOCCUS AUREUS SUSCEPTIBILITIES TO FOLLOW CULTURE REINCUBATED FOR BETTER GROWTH Performed at [facility], [address], [place], [place] [address]    Report Status PENDING  Incomplete     Rexene Alberts, MSN, NP-C [facility]  [email or web address] Pager: [phone] Office: [phone] [facility] Main Line: [phone] *Secure Chat Communication Welcome

## 2022-02-13 ENCOUNTER — Inpatient Hospital Stay (HOSPITAL_COMMUNITY): Payer: Medicaid Other

## 2022-02-13 DIAGNOSIS — R7881 Bacteremia: Secondary | ICD-10-CM | POA: Diagnosis not present

## 2022-02-13 DIAGNOSIS — B9562 Methicillin resistant Staphylococcus aureus infection as the cause of diseases classified elsewhere: Secondary | ICD-10-CM | POA: Diagnosis not present

## 2022-02-13 DIAGNOSIS — D649 Anemia, unspecified: Secondary | ICD-10-CM

## 2022-02-13 HISTORY — DX: Anemia, unspecified: D64.9

## 2022-02-13 LAB — RPR: RPR Ser Ql: NONREACTIVE

## 2022-02-13 LAB — COMPREHENSIVE METABOLIC PANEL
ALT: 28 U/L (ref 0–44)
AST: 27 U/L (ref 15–41)
Albumin: <1.5 g/dL — ABNORMAL LOW (ref 3.5–5.0)
Alkaline Phosphatase: 102 U/L (ref 38–126)
Anion gap: 6 (ref 5–15)
BUN: 24 mg/dL — ABNORMAL HIGH (ref 6–20)
CO2: 20 mmol/L — ABNORMAL LOW (ref 22–32)
Calcium: 6.9 mg/dL — ABNORMAL LOW (ref 8.9–10.3)
Chloride: 110 mmol/L (ref 98–111)
Creatinine, Ser: 1.12 mg/dL — ABNORMAL HIGH (ref 0.44–1.00)
GFR, Estimated: >60 mL/min (ref >60)
Glucose, Bld: 108 mg/dL — ABNORMAL HIGH (ref 70–99)
Potassium: 3.7 mmol/L (ref 3.5–5.1)
Sodium: 136 mmol/L (ref 135–145)
Total Bilirubin: 1.4 mg/dL — ABNORMAL HIGH (ref 0.3–1.2)
Total Protein: 5.1 g/dL — ABNORMAL LOW (ref 6.5–8.1)

## 2022-02-13 LAB — GLUCOSE, CAPILLARY
Glucose-Capillary: 138 mg/dL — ABNORMAL HIGH (ref 70–99)
Glucose-Capillary: 99 mg/dL (ref 70–99)
Glucose-Capillary: 103 mg/dL — ABNORMAL HIGH (ref 70–99)

## 2022-02-13 LAB — MISC LABCORP TEST (SEND OUT)
LabCorp test name: 88005
Labcorp test code: 88005

## 2022-02-13 LAB — CBC
HCT: 24.7 % — ABNORMAL LOW (ref 36.0–46.0)
Hemoglobin: 8.2 g/dL — ABNORMAL LOW (ref 12.0–15.0)
MCH: 24.6 pg — ABNORMAL LOW (ref 26.0–34.0)
MCHC: 33.2 g/dL (ref 30.0–36.0)
MCV: 74.2 fL — ABNORMAL LOW (ref 80.0–100.0)
Platelets: 287 10*3/uL (ref 150–400)
RBC: 3.33 MIL/uL — ABNORMAL LOW (ref 3.87–5.11)
RDW: 23.4 % — ABNORMAL HIGH (ref 11.5–15.5)
WBC: 24.4 10*3/uL — ABNORMAL HIGH (ref 4.0–10.5)
nRBC: 0 % (ref 0.0–0.2)

## 2022-02-13 LAB — MAGNESIUM: Magnesium: 2.1 mg/dL (ref 1.7–2.4)

## 2022-02-13 IMAGING — MR MR CERVICAL SPINE WO/W CM
5 series · 37 of 48 positions shown · IV contrast (gadavist)
Comparison: [DATE]

CLINICAL DATA: Mid back pain, worsening. Persistent bacteremia,
evaluate for spinal infection

EXAM:
MRI CERVICAL AND THORACIC SPINE WITHOUT AND WITH CONTRAST
TECHNIQUE: Multiplanar and multiecho pulse sequences of the cervical spine, to
include the craniocervical junction and cervicothoracic junction,
and the thoracic spine, were obtained without and with intravenous
contrast.
CONTRAST:  7mL GADAVIST GADOBUTROL 1 MMOL/ML IV SOLN

[Series 1: T2 · sagittal · 3.0mm · 0.69mm/px · 6 of 15 slices shown (1 of 2)]
[im 1/15]
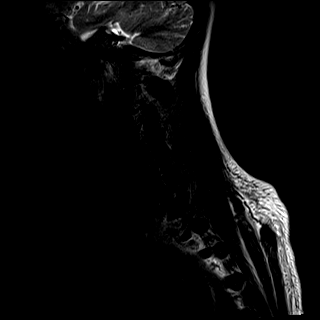
[im 3/15]
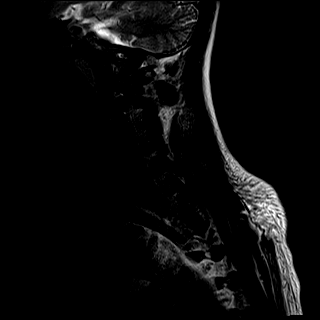
[im 6/15]
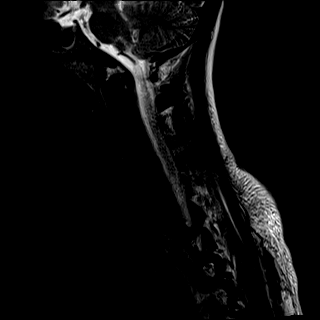
[im 9/15]
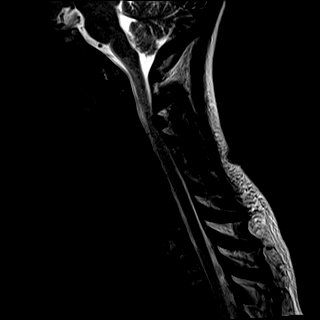
[im 12/15]
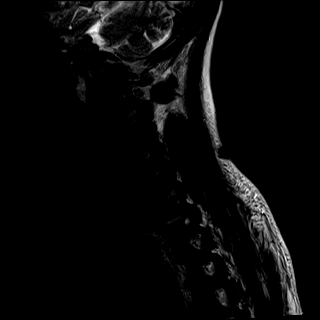
[im 15/15]
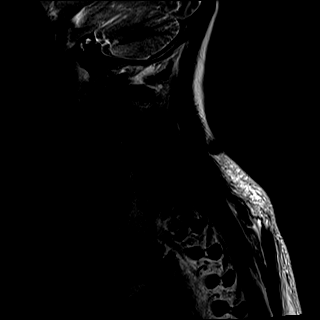

[Series 2: T1 · sagittal · 3.0mm · 0.69mm/px · 6 of 15 slices shown]
[im 1/15]
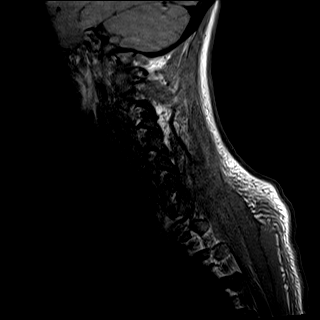
[im 3/15]
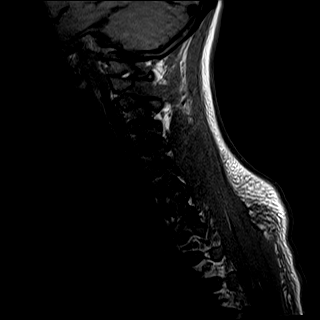
[im 6/15]
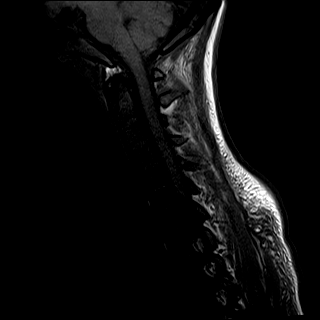
[im 9/15]
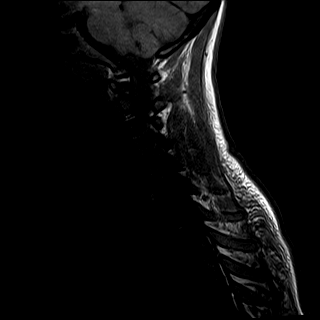
[im 12/15]
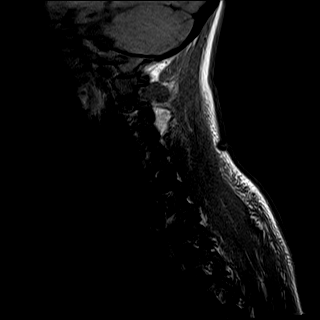
[im 15/15]
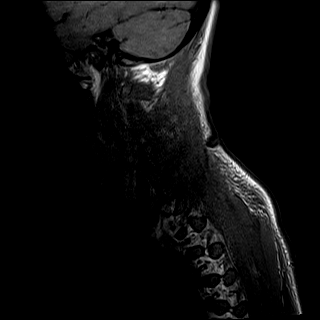

[Series 3: STIR · sagittal · 3.0mm · 0.86mm/px · 6 of 15 slices shown]
[im 1/15]
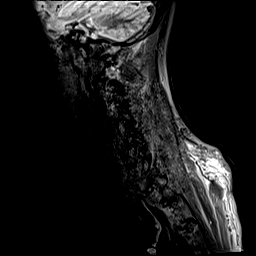
[im 3/15]
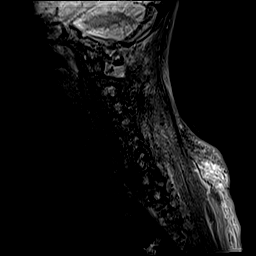
[im 6/15]
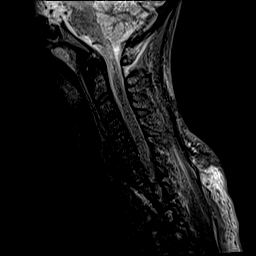
[im 9/15]
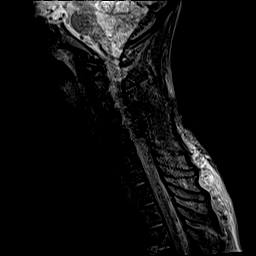
[im 12/15]
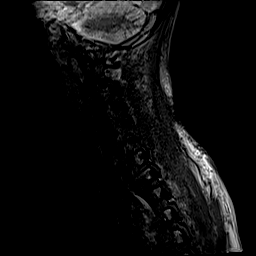
[im 15/15]
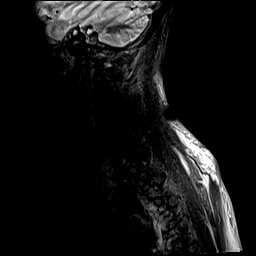

[Series 4: T2 · axial · 3.0mm · 0.66mm/px · z∈[-105,+16]mm · 11 of 40 slices shown (2 of 2)]
[im 1/40]
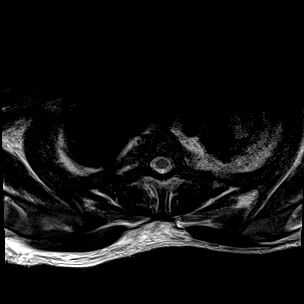
[im 3/40]
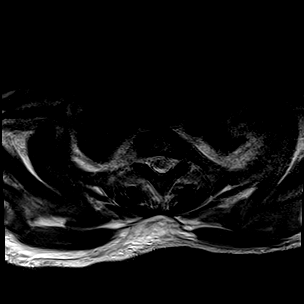
[im 6/40]
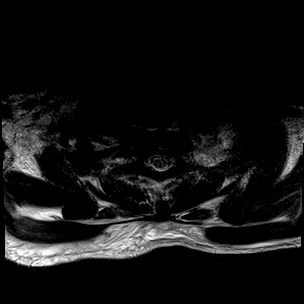
[im 9/40]
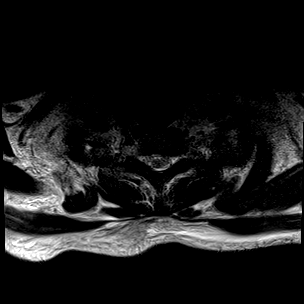
[im 12/40]
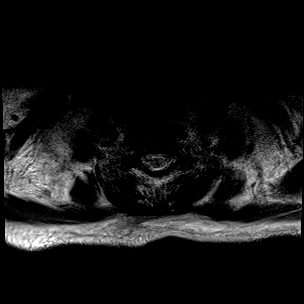
[im 17/40]
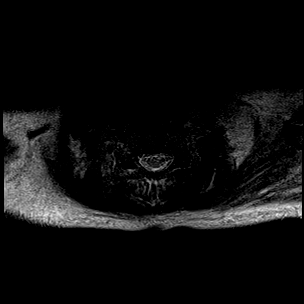
[im 20/40]
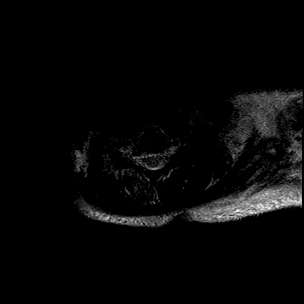
[im 23/40]
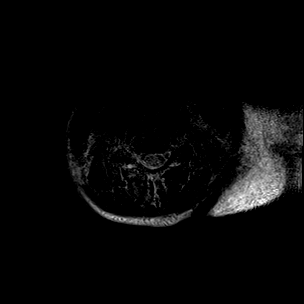
[im 28/40]
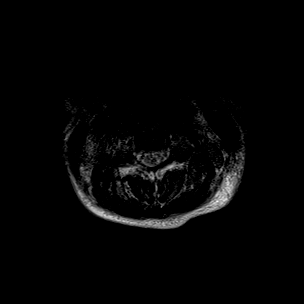
[im 34/40]
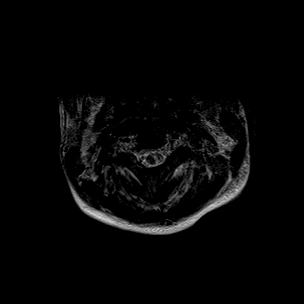
[im 40/40]
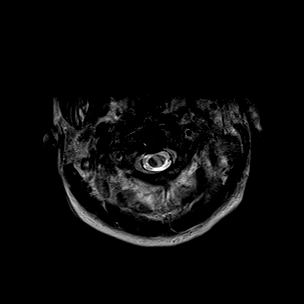

[Series 5: GRE · axial · 3.0mm · 0.35mm/px · z∈[-101,+20]mm · 8 of 40 slices shown]
[im 1/40]
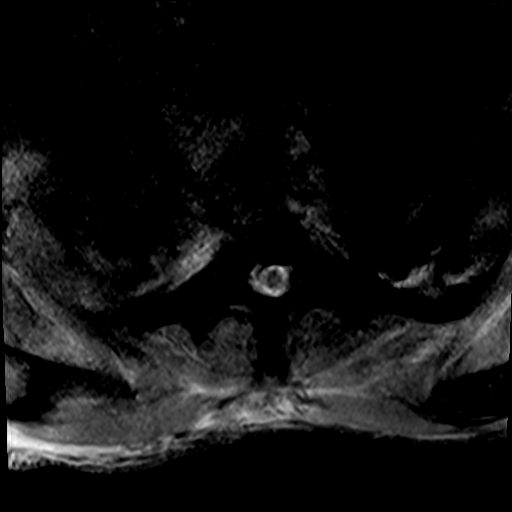
[im 6/40]
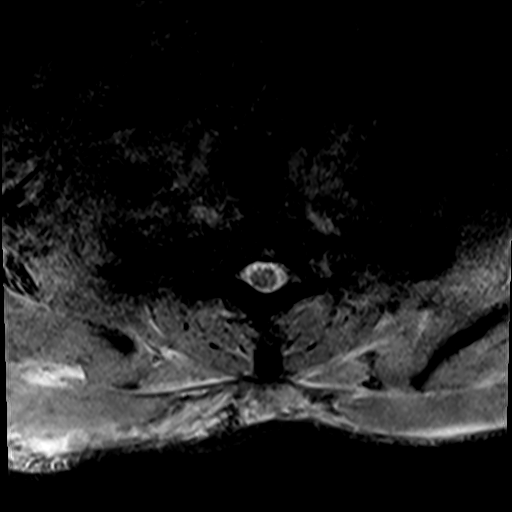
[im 12/40]
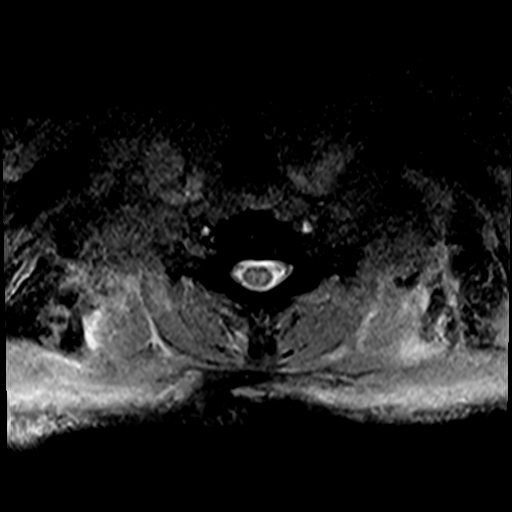
[im 17/40]
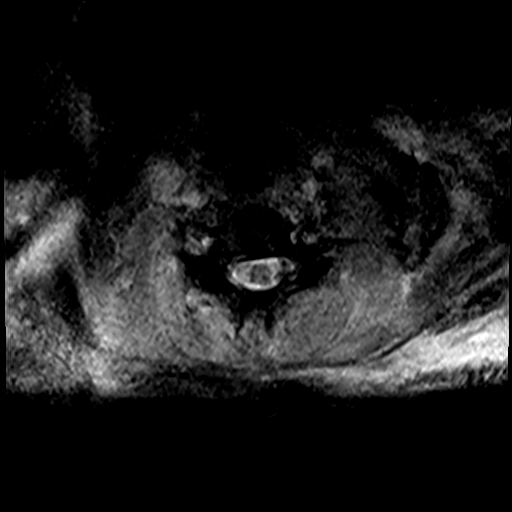
[im 23/40]
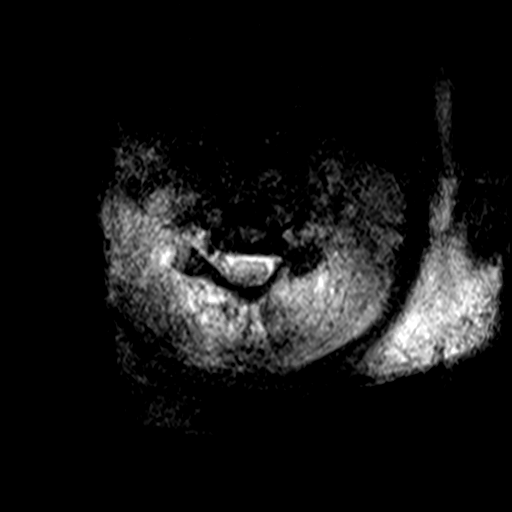
[im 28/40]
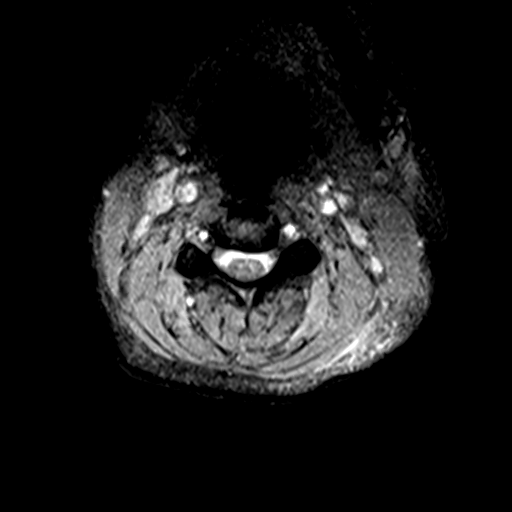
[im 34/40]
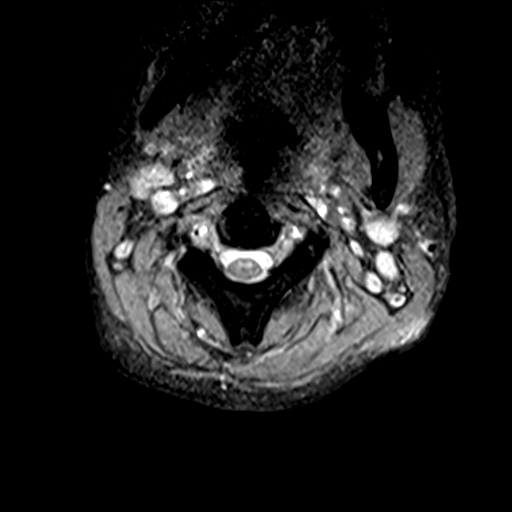
[im 40/40]
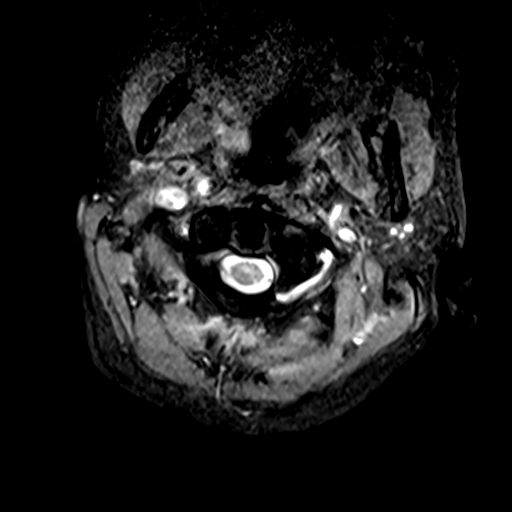

[37 of 48 positions shown; findings below may reference images not displayed]

FINDINGS: MRI CERVICAL SPINE FINDINGS

Very motion degraded

Alignment: Physiologic.

Vertebrae: No fracture, evidence of discitis, or bone lesion.

Cord: Normal signal and morphology.  No evidence of collection

Posterior Fossa, vertebral arteries, paraspinal tissues:
Subcutaneous edema which is generalized.

Disc levels:

No evidence of herniation or impingement.

MRI THORACIC SPINE FINDINGS

Alignment:  Normal

Vertebrae: New anterior inferior corner edema at T11. No disc T2
hyperintensity or endplate destruction.

Cord:  Normal signal and morphology.  No evidence of cord collection

Paraspinal and other soft tissues: Extensive bilateral pneumonia
with cavitary features and complicated bilateral pleural effusion.
Cavitation and pleural fluid is progressed from comparison CT
[DATE]

Disc levels:

No herniation, impingement, or facet spurring
IMPRESSION: 1. New area of marrow edema at the anterior inferior corner of T11,
likely early osteomyelitis/apophysitis.
2. No evidence of cervical spine infection.
3. Negative for cervicothoracic canal collection.
4. Partially covered bilateral septic pneumonia and complex pleural
effusions, progressed from chest CT [DATE].

## 2022-02-13 IMAGING — MR MR CERVICAL SPINE WO/W CM
4 series · 19 of 48 positions shown · IV contrast (MH)
Comparison: [DATE]

CLINICAL DATA: Mid back pain, worsening. Persistent bacteremia,
evaluate for spinal infection

EXAM:
MRI CERVICAL AND THORACIC SPINE WITHOUT AND WITH CONTRAST
TECHNIQUE: Multiplanar and multiecho pulse sequences of the cervical spine, to
include the craniocervical junction and cervicothoracic junction,
and the thoracic spine, were obtained without and with intravenous
contrast.
CONTRAST:  7mL GADAVIST GADOBUTROL 1 MMOL/ML IV SOLN

[Series 27: T1 · axial · 3.0mm · 0.35mm/px · z∈[-101,+20]mm · 9 of 40 slices shown]
[im 1/40]
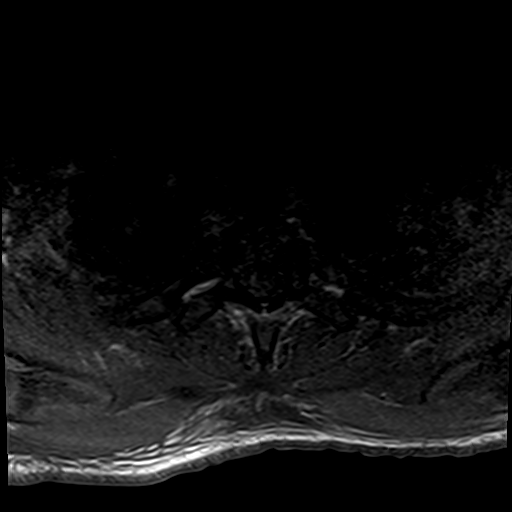
[im 6/40]
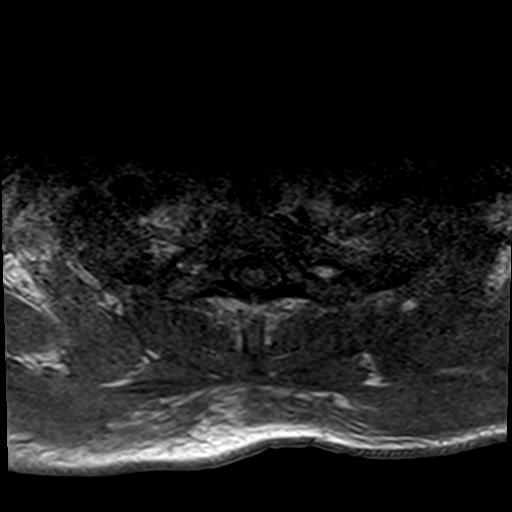
[im 12/40]
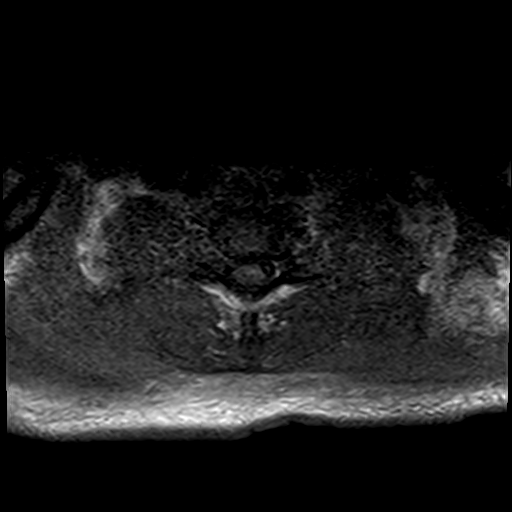
[im 17/40]
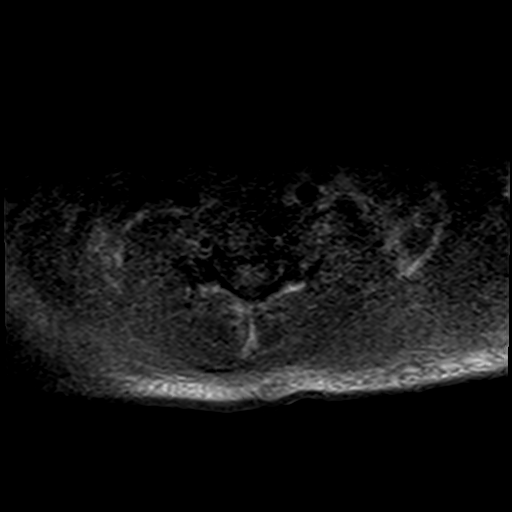
[im 20/40]
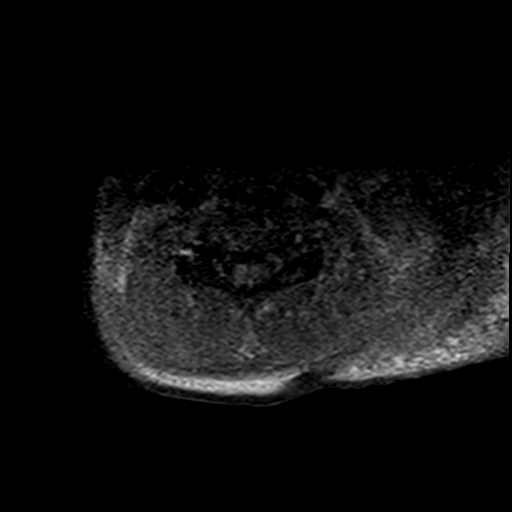
[im 23/40]
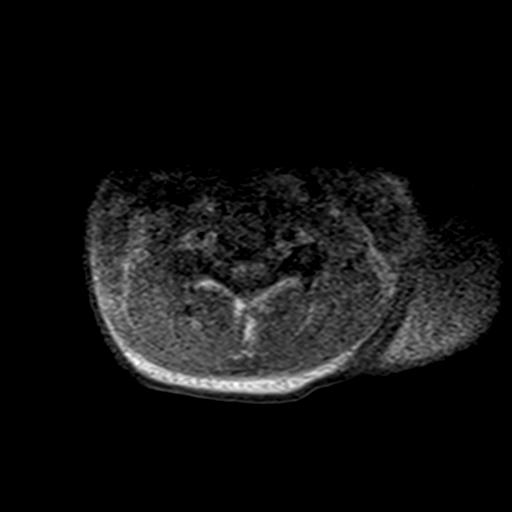
[im 28/40]
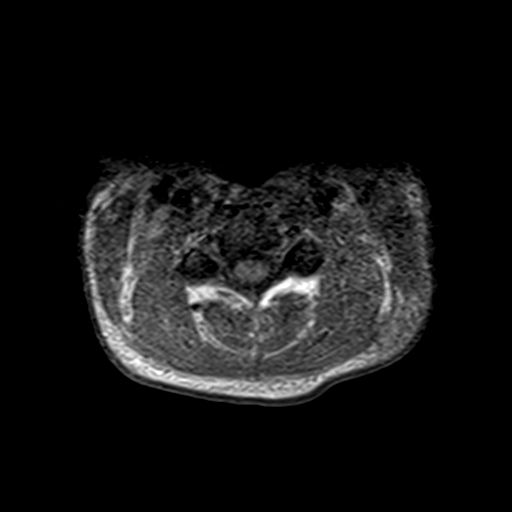
[im 34/40]
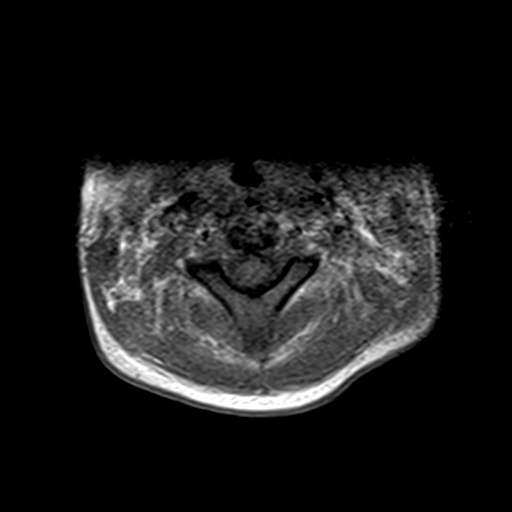
[im 40/40]
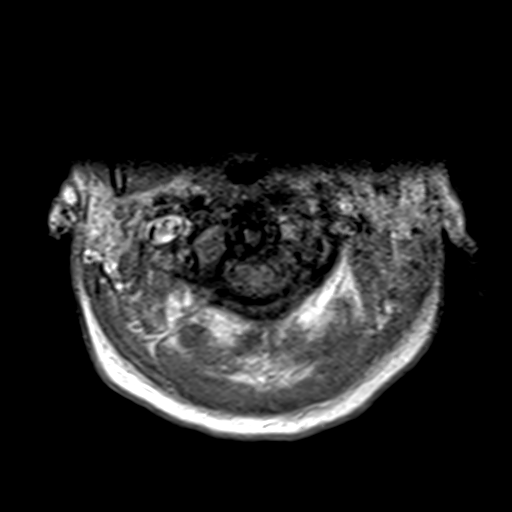

[Series 30: T1 post-contrast · sagittal · 3.0mm · 0.43mm/px · 4 of 15 slices shown (1 of 3)]
[im 1/15]
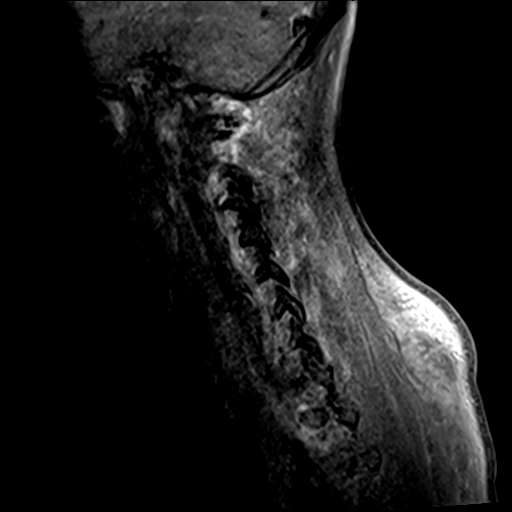
[im 4/15]
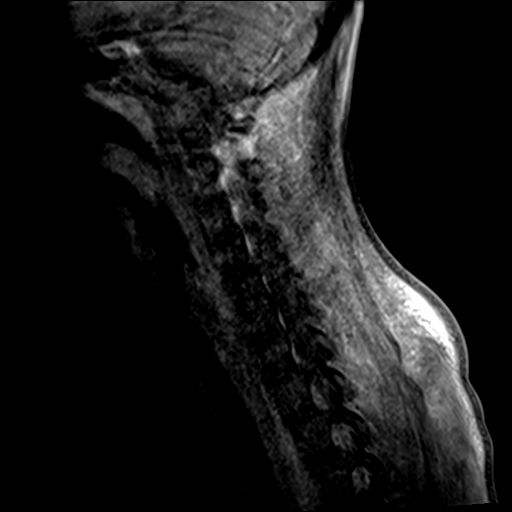
[im 8/15]
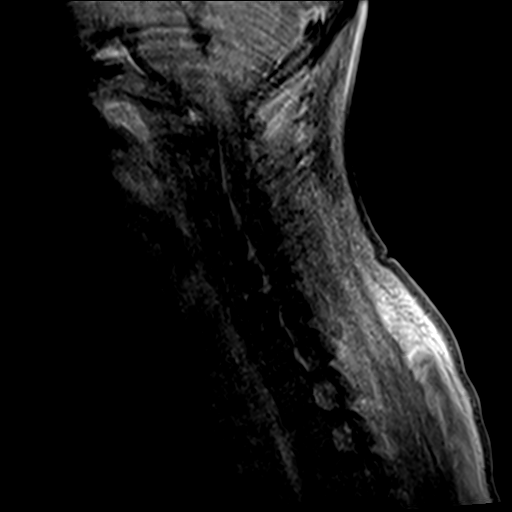
[im 15/15]
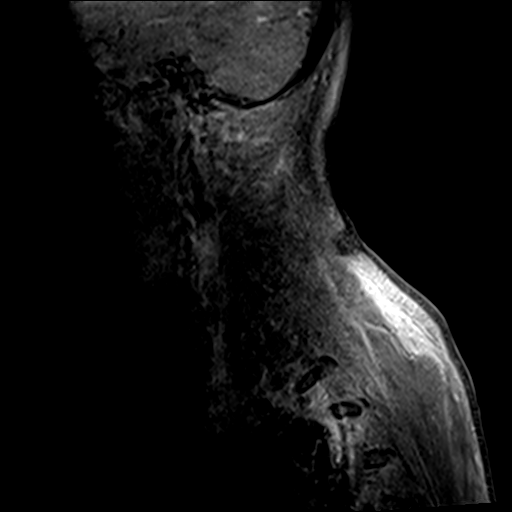

[Series 31: T1 post-contrast · axial · 3.0mm · 0.35mm/px · z∈[-83,+1]mm · 3 of 40 slices shown (2 of 3)]
[im 7/40]
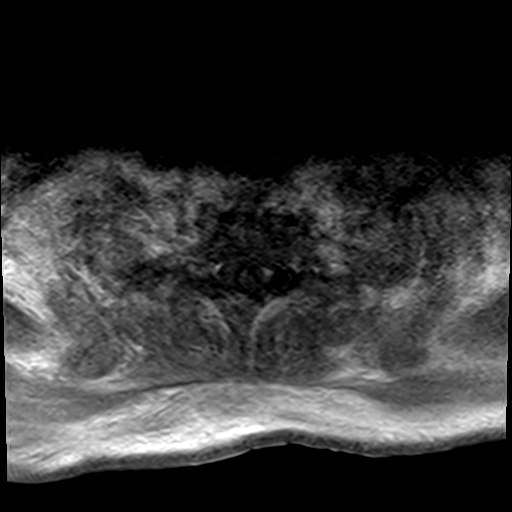
[im 22/40]
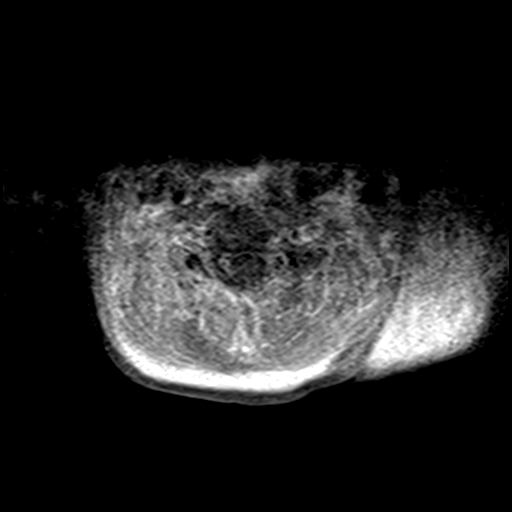
[im 34/40]
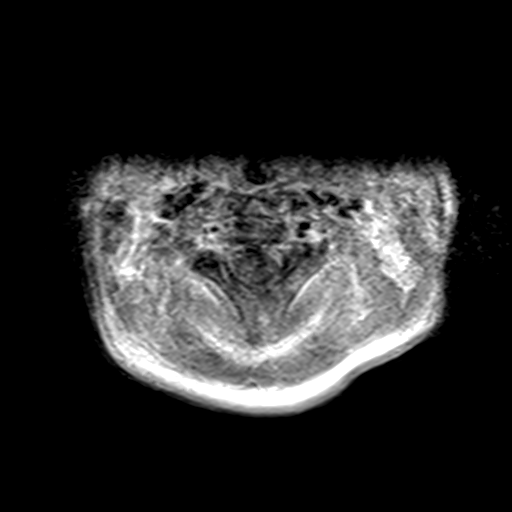

[Series 33: T1 post-contrast · axial · 3.0mm · 0.35mm/px · z∈[-83,+1]mm · 3 of 40 slices shown (3 of 3)]
[im 7/40]
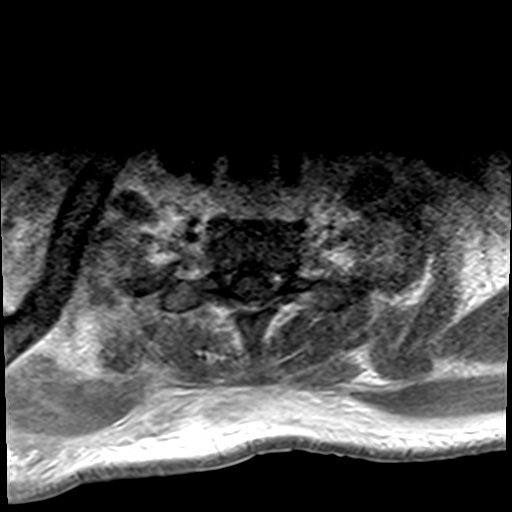
[im 22/40]
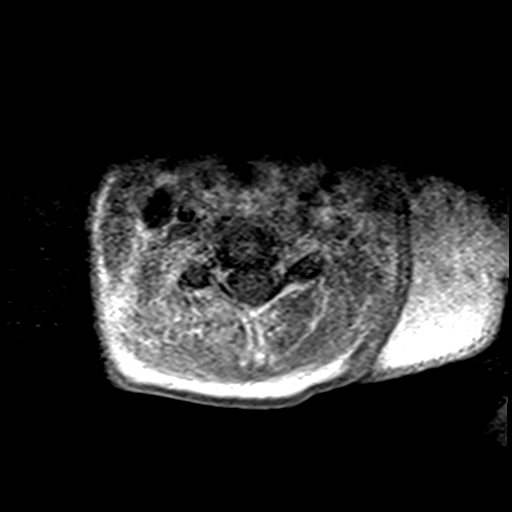
[im 34/40]
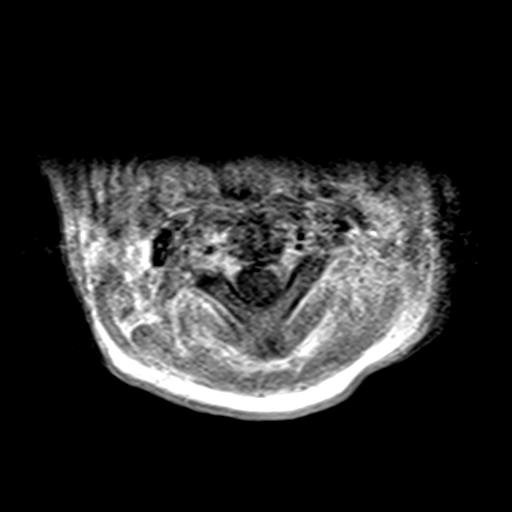

[19 of 48 positions shown; findings below may reference images not displayed]

FINDINGS: MRI CERVICAL SPINE FINDINGS

Very motion degraded

Alignment: Physiologic.

Vertebrae: No fracture, evidence of discitis, or bone lesion.

Cord: Normal signal and morphology.  No evidence of collection

Posterior Fossa, vertebral arteries, paraspinal tissues:
Subcutaneous edema which is generalized.

Disc levels:

No evidence of herniation or impingement.

MRI THORACIC SPINE FINDINGS

Alignment:  Normal

Vertebrae: New anterior inferior corner edema at T11. No disc T2
hyperintensity or endplate destruction.

Cord:  Normal signal and morphology.  No evidence of cord collection

Paraspinal and other soft tissues: Extensive bilateral pneumonia
with cavitary features and complicated bilateral pleural effusion.
Cavitation and pleural fluid is progressed from comparison CT
[DATE]

Disc levels:

No herniation, impingement, or facet spurring
IMPRESSION: 1. New area of marrow edema at the anterior inferior corner of T11,
likely early osteomyelitis/apophysitis.
2. No evidence of cervical spine infection.
3. Negative for cervicothoracic canal collection.
4. Partially covered bilateral septic pneumonia and complex pleural
effusions, progressed from chest CT [DATE].

## 2022-02-13 MED ORDER — BISACODYL 5 MG PO TBEC
10.0000 mg | DELAYED_RELEASE_TABLET | Freq: Every day | ORAL | Status: DC
  Administered 2022-02-14 – 2022-02-26 (×9): 10 mg via ORAL
  Filled 2022-02-13 (×12): qty 2

## 2022-02-13 MED ORDER — KETOROLAC TROMETHAMINE 15 MG/ML IJ SOLN
15.0000 mg | Freq: Three times a day (TID) | INTRAMUSCULAR | Status: DC
  Administered 2022-02-14: 15 mg via INTRAVENOUS
  Filled 2022-02-13: qty 1

## 2022-02-13 MED ORDER — GADOBUTROL 1 MMOL/ML IV SOLN
7.0000 mL | Freq: Once | INTRAVENOUS | Status: AC | PRN
  Administered 2022-02-13: 7 mL via INTRAVENOUS

## 2022-02-13 MED ORDER — ACETAMINOPHEN 500 MG PO TABS
1000.0000 mg | ORAL_TABLET | Freq: Four times a day (QID) | ORAL | Status: DC
  Administered 2022-02-13 – 2022-02-17 (×13): 1000 mg via ORAL
  Filled 2022-02-13 (×16): qty 2

## 2022-02-13 MED ORDER — KETOROLAC TROMETHAMINE 30 MG/ML IJ SOLN
15.0000 mg | Freq: Three times a day (TID) | INTRAMUSCULAR | Status: DC
  Administered 2022-02-13: 15 mg via INTRAVENOUS
  Filled 2022-02-13 (×2): qty 1

## 2022-02-13 NOTE — Progress Notes (Addendum)
PHARMACY - PHYSICIAN COMMUNICATION CRITICAL VALUE ALERT - BLOOD CULTURE IDENTIFICATION (BCID)  Carol Rivera is an 31 y.o. 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  Assessment:  3 out of 7 + GPC in clusters (MRSA)  Name of physician (or Provider) Contacted: none  Current antibiotics: Daptomycin/ceftaroline  Changes to prescribed antibiotics recommended:  Patient is on recommended antibiotics - No changes needed  Results for orders placed or performed during the hospital encounter of 02/06/22  Blood Culture ID Panel (Reflexed) (Collected: 02/10/2022  6:43 AM)  Result Value Ref Range   Enterococcus faecalis NOT DETECTED NOT DETECTED   Enterococcus Faecium NOT DETECTED NOT DETECTED   Listeria monocytogenes NOT DETECTED NOT DETECTED   Staphylococcus species DETECTED (A) NOT DETECTED   Staphylococcus aureus (BCID) DETECTED (A) NOT DETECTED   Staphylococcus epidermidis NOT DETECTED NOT DETECTED   Staphylococcus lugdunensis NOT DETECTED NOT DETECTED   Streptococcus species NOT DETECTED NOT DETECTED   Streptococcus agalactiae NOT DETECTED NOT DETECTED   Streptococcus pneumoniae NOT DETECTED NOT DETECTED   Streptococcus pyogenes NOT DETECTED NOT DETECTED   A.calcoaceticus-baumannii NOT DETECTED NOT DETECTED   Bacteroides fragilis NOT DETECTED NOT DETECTED   Enterobacterales NOT DETECTED NOT DETECTED   Enterobacter cloacae complex NOT DETECTED NOT DETECTED   Escherichia coli NOT DETECTED NOT DETECTED   Klebsiella aerogenes NOT DETECTED NOT DETECTED   Klebsiella oxytoca NOT DETECTED NOT DETECTED   Klebsiella pneumoniae NOT DETECTED NOT DETECTED   Proteus species NOT DETECTED NOT DETECTED   Salmonella species NOT DETECTED NOT DETECTED   Serratia marcescens NOT DETECTED NOT DETECTED   Haemophilus influenzae NOT DETECTED NOT DETECTED   Neisseria meningitidis NOT DETECTED NOT DETECTED    Pseudomonas aeruginosa NOT DETECTED NOT DETECTED   Stenotrophomonas maltophilia NOT DETECTED NOT DETECTED   Candida albicans NOT DETECTED NOT DETECTED   Candida auris NOT DETECTED NOT DETECTED   Candida glabrata NOT DETECTED NOT DETECTED   Candida krusei NOT DETECTED NOT DETECTED   Candida parapsilosis NOT DETECTED NOT DETECTED   Candida tropicalis NOT DETECTED NOT DETECTED   Cryptococcus neoformans/gattii NOT DETECTED NOT DETECTED   Meth resistant mecA/C and MREJ DETECTED (A) NOT DETECTED    Bain Whichard E Shondell Poulson 02/13/2022  3:37 PM

## 2022-02-13 NOTE — Progress Notes (Signed)
PROGRESS NOTE Carol Rivera  ZOX:[id number] DOB: Jan 11, 1991 DOA: 02/06/2022 PCP: Pcp, No   Brief Narrative/Hospital Course: 31 y.o. F W/ history significant for childhood asthma and intravenous drug abuse presented with fever, chest pain, cough and left shoulder pain. In ED-febrile, tachycardic.  CXR and left shoulder x-ray was negative for acute findings. Sodium 128, alkaline phosphatase 163, AST 257, ALT 454, total bilirubin of 6.9 with WBCs of 17,900 and lactic acid of 4.  COVID-19 and influenza testing negative.  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  MRI of the spine without obvious abscess/phlegmon or osseous involvement.subsequently transferred from [facility] to [facility] 5/22 for TEE and CT surgery evaluation. TEE--Large tricuspid vegetation, not accessible via angiovac per CTS on 5/22, also not a candidate for valve replacement given recent IV drug use -Seen by Orthopedic Surgery for septic AC joints, now s/p L shoulder AC resection and extensive debridement on 5/22. Sent to ICU and now back to [facility].      Subjective: Seen and examined this morning.  Was sleeping woke up on calling ,alert awake oriented. Complains of upper back pain, reports she is less anxious.  Able to move all her extremities. No focal weakness. Overnight Tmax 98.9 heart rate running in the low 100, she has been tachypneic from 20s to 40s-unchanged w/  pulse ox stable on room air Labs with slightly worsening leukocytosis and mildly elevated creatinine  Assessment and Plan: Principal Problem:   MRSA bacteremia Active Problems:   Sepsis (HCC)   Hyponatremia   Elevated LFTs   IVDU (intravenous drug user)   Left shoulder pain   Chest pain, pleuritic   Septic pulmonary embolism  without acute cor pulmonale (HCC)   Endocarditis of tricuspid valve   Staphylococcal arthritis of left shoulder (HCC)   Osteomyelitis (HCC)  Severe sepsis POA due to MRSA infection  MRSA tricuspid valve endocarditis-with large vegetation/destruction and severe TR: Mid and upper back pain Persistent bacteremia Septic emboli/multifocal pneumonia: Sepsis/bacteremia endocarditis left AC septic arthritis in the setting of IV drug abuse.  Persistent bacteremia repeat blood culture 5/25 sent. S/p TEE-last tricuspid vegetation CT surgery eval 5/22 not accessible via angiovac, and not a candidate for valve replacement given recent IV drug abuse and valve destruction. ID following continue daptomycin, Teflaro.  Follow-up C/S from OR sample of left shoulder joint.  Monitor hemodynamics, labs.  T and C-spine MRI with and without contrast pending to reeval vertebral infection. Recent Labs  Lab 02/07/22 0340 02/07/22 0658 02/07/22 1429 02/07/22 1722 02/08/22 0304 02/08/22 0848 02/09/22 0340 02/09/22 1655 02/11/22 0136 02/12/22 0224 02/13/22 0119  WBC  --  13.1*  --   --  17.0*  --  20.5*  --  32.2* 21.0* 24.4*  LATICACIDVEN >9.0* 2.8* 2.9* 2.9*  --   --   --  1.6  --   --   --   PROCALCITON  --  16.71  --   --   --  14.88 11.87  --   --   --   --      IVDA: Counseling when able.  HIV/RPR/hep B negative.  Repeating hep C antibody present, repeating hep c quantitive RNA.  Continue antianxiety with Klonopin, as needed Xanax/Ativan, pain control with oxy and low-dose Dilaudid-given patient's significant opiates use  Septic left AC joint: Status post I&D  5/22, continue pain control dressing per orthopedics.  Pain control with oral and IV opiates and on opiates  Hyponatremia: Resolved Elevated LFTs: Likely from sepsis.  Improved Chest pain, pleuritic: Continue pain control.  Respiratory status is stable although tachypneic doing well on room air Acute metabolic encephalopathy on admission, mental  status improved alert awake oriented Mild AKI with slightly increasing creatinine decrease Toradol add IV fluids.  Class I Obesity:Patient's Body mass index is 30.05 kg/m. : Will benefit with PCP follow-up, weight loss  healthy lifestyle and outpatient follow-up.  DVT prophylaxis: SCDs Start: 02/10/22 1422 Place TED hose Start: 02/10/22 1422 Place and maintain sequential compression device Start: 02/09/22 1132 enoxaparin (LOVENOX) injection 40 mg Start: 02/07/22 1000 Code Status:   Code Status: Full Code Family Communication: plan of care discussed with patient at bedside. Patient status is: Inpatient because of ongoing management of bacteremia endocarditis Level of care: Progressive   Dispo: The patient is from: home            Anticipated disposition: TBD  Mobility Assessment (last 72 hours)     Mobility Assessment   No documentation.            Objective: Vitals last 24 hrs: Vitals:   02/13/22 0000 02/13/22 0200 02/13/22 0400 02/13/22 0600  BP: 112/72 107/71 113/74 104/68  Pulse: (!) 108 (!) 101 100   Resp: (!) 41 (!) 38 (!) 40 (!) 49  Temp:      TempSrc:      SpO2: 97% 97% 97%   Weight:   69.8 kg   Height:       Weight change: 5.1 kg  Physical Examination: General exam: alert awake,older than stated age, weak appearing. HEENT:Oral mucosa moist, Ear/Nose WNL grossly, dentition normal. Respiratory system: bilaterally diminished BS, no use of accessory muscle Cardiovascular system: S1 & S2 +, murmur present no JVD. Gastrointestinal system: Abdomen soft,NT,ND, BS+ Nervous System:Alert, awake, moving extremities and grossly nonfocal Extremities: LE edema TRACE,distal peripheral pulses palpable.  Skin: No rashes,no icterus. MSK: Normal muscle bulk,tone, power.  Left shoulder site with dressing in place  Medications reviewed:  Scheduled Meds:  acetaminophen  650 mg Oral Q8H   Chlorhexidine Gluconate Cloth  6 each Topical Daily   clonazePAM  0.5 mg Oral BID    docusate sodium  100 mg Oral BID   enoxaparin (LOVENOX) injection  40 mg Subcutaneous Q24H   ketorolac  30 mg Intravenous Q8H   mouth rinse  15 mL Mouth Rinse BID   metoprolol tartrate  12.5 mg Oral BID   mupirocin ointment   Nasal BID   nicotine  14 mg Transdermal Daily   QUEtiapine  25 mg Oral QHS   sodium chloride flush  3 mL Intravenous Q12H   Continuous Infusions:  sodium chloride Stopped (02/11/22 1857)   ceFTAROline (TEFLARO) IV 100 mL/hr at 02/13/22 0700   DAPTOmycin (CUBICIN)  IV Stopped (02/12/22 2233)   methocarbamol (ROBAXIN) IV        Diet Order             Diet regular Room service appropriate? Yes; Fluid consistency: Thin  Diet effective now                            Intake/Output Summary (Last 24 hours) at 02/13/2022 0812 Last data filed at 02/13/2022 0601 Gross per 24 hour  Intake 1708.77 ml  Output --  Net 1708.77 ml   Net IO  Since Admission: 19,470.5 mL [02/13/22 0812]  Wt Readings from Last 3 Encounters:  02/13/22 69.8 kg  11/30/15 79.4 kg  02/25/12 90.3 kg     Unresulted Labs (From admission, onward)     Start     Ordered   02/16/22 0500  CK  Once-Timed,   R       Question:  Specimen collection method  Answer:  Lab=Lab collect   02/09/22 1009   02/14/22 0500  Creatinine, serum  (enoxaparin (LOVENOX)    CrCl >/= 30 ml/min)  Weekly,   R     Comments: while on enoxaparin therapy   Question:  Specimen collection method  Answer:  Lab=Lab collect   02/07/22 0427   02/13/22 0500  HCV RNA quant  Tomorrow morning,   R       Question:  Specimen collection method  Answer:  Lab=Lab collect   02/12/22 1537   02/13/22 0000  Culture, blood (Routine X 2) w Reflex to ID Panel  BLOOD CULTURE X 2,   R (with TIMED occurrences)      02/12/22 1408   02/11/22 0500  Magnesium  Daily,   R     Question:  Specimen collection method  Answer:  Lab=Lab collect   02/10/22 0815   02/11/22 0500  CBC  Daily,   R     Question:  Specimen collection method  Answer:   Lab=Lab collect   02/10/22 0815   02/11/22 0500  Basic metabolic panel  Daily,   R     Question:  Specimen collection method  Answer:  Lab=Lab collect   02/10/22 0815   02/10/22 0500  Comprehensive metabolic panel  Daily,   R     Question:  Specimen collection method  Answer:  Lab=Lab collect   02/09/22 1100          Data Reviewed: I have personally reviewed following labs and imaging studies CBC: Recent Labs  Lab 02/06/22 2020 02/07/22 0658 02/08/22 0304 02/09/22 0340 02/11/22 0136 02/12/22 0224 02/13/22 0119  WBC 17.9*   < > 17.0* 20.5* 32.2* 21.0* 24.4*  NEUTROABS 14.9*  --   --   --   --   --   --   HGB 12.2   < > 12.4 10.6* 9.5* 8.3* 8.2*  HCT 37.0   < > 36.5 32.7* 27.5* 25.2* 24.7*  MCV 76.1*   < > 74.8* 74.7* 72.2* 73.7* 74.2*  PLT 187   < > 126* 149* 159 212 287   < > = values in this interval not displayed.   Basic Metabolic Panel: Recent Labs  Lab 02/08/22 0304 02/09/22 0340 02/10/22 0653 02/11/22 0618 02/12/22 0224 02/13/22 0119  NA 134* 133* 134* 137 136 136  K 4.1 3.7 3.5 3.4* 3.6 3.7  CL 101 101 104 110 109 110  CO2 26 24 20* 19* 22 20*  GLUCOSE 94 108* 55* 86 120* 108*  BUN 10 12 12 18  24* 24*  CREATININE 0.53 0.67 0.62 0.70 0.98 1.12*  CALCIUM 8.0* 7.7* 7.2* 7.1* 6.9* 6.9*  MG 1.4*  --  2.0 2.1 2.1 2.1  PHOS 2.4*  --  4.1  --   --   --    GFR: Estimated Creatinine Clearance: 63.4 mL/min (A) (by C-G formula based on SCr of 1.12 mg/dL (H)). Liver Function Tests: Recent Labs  Lab 02/09/22 0340 02/10/22 0653 02/11/22 0618 02/12/22 0224 02/13/22 0119  AST 62* 45* 43* 34 27  ALT 125* 69* 46*  36 28  ALKPHOS 125 186* 105 107 102  BILITOT 5.7* 6.3* 4.8* 2.0* 1.4*  PROT 5.7* 5.6* 5.2* 5.1* 5.1*  ALBUMIN 1.6* <1.5* 1.6* <1.5* <1.5*   No results for input(s): LIPASE, AMYLASE in the last 168 hours. No results for input(s): AMMONIA in the last 168 hours. Coagulation Profile: Recent Labs  Lab 02/06/22 2020  INR 1.3*   BNP (last 3  results) No results for input(s): PROBNP in the last 8760 hours. HbA1C: No results for input(s): HGBA1C in the last 72 hours. CBG: Recent Labs  Lab 02/11/22 1535 02/11/22 2210 02/12/22 1153 02/12/22 1705 02/13/22 0658  GLUCAP 132* 164* 102* 129* 138*   Lipid Profile: No results for input(s): CHOL, HDL, LDLCALC, TRIG, CHOLHDL, LDLDIRECT in the last 72 hours. Thyroid Function Tests: No results for input(s): TSH, T4TOTAL, FREET4, T3FREE, THYROIDAB in the last 72 hours. Sepsis Labs: Recent Labs  Lab 02/07/22 0658 02/07/22 1429 02/07/22 1722 02/08/22 0848 02/09/22 0340 02/09/22 1655  PROCALCITON 16.71  --   --  14.88 11.87  --   LATICACIDVEN 2.8* 2.9* 2.9*  --   --  1.6  Antimicrobials: Anti-infectives (From admission, onward)    Start     Dose/Rate Route Frequency Ordered Stop   02/10/22 1152  vancomycin (VANCOCIN) powder  Status:  Discontinued          As needed 02/10/22 1153 02/10/22 1242   02/09/22 1400  ceftaroline (TEFLARO) 600 mg in sodium chloride 0.9 % 100 mL IVPB        600 mg 100 mL/hr over 60 Minutes Intravenous Every 8 hours 02/09/22 0955     02/09/22 1200  DAPTOmycin (CUBICIN) 500 mg in sodium chloride 0.9 % IVPB        8 mg/kg  61.2 kg 120 mL/hr over 30 Minutes Intravenous Daily 02/09/22 0955     02/07/22 2200  Vancomycin (VANCOCIN) 1,250 mg in sodium chloride 0.9 % 250 mL IVPB  Status:  Discontinued        1,250 mg 166.7 mL/hr over 90 Minutes Intravenous Every 24 hours 02/07/22 0434 02/07/22 1645   02/07/22 2200  vancomycin (VANCOREADY) IVPB 1250 mg/250 mL  Status:  Discontinued        1,250 mg 166.7 mL/hr over 90 Minutes Intravenous Every 24 hours 02/07/22 1645 02/09/22 0955   02/07/22 1000  metroNIDAZOLE (FLAGYL) IVPB 500 mg  Status:  Discontinued        500 mg 100 mL/hr over 60 Minutes Intravenous Every 12 hours 02/07/22 0427 02/07/22 1231   02/07/22 0600  ceFEPIme (MAXIPIME) 2 g in sodium chloride 0.9 % 100 mL IVPB  Status:  Discontinued        2  g 200 mL/hr over 30 Minutes Intravenous Every 8 hours 02/07/22 0434 02/07/22 1231   02/06/22 2130  metroNIDAZOLE (FLAGYL) IVPB 500 mg        500 mg 100 mL/hr over 60 Minutes Intravenous  Once 02/06/22 2121 02/06/22 2254   02/06/22 2130  ceFEPIme (MAXIPIME) 2 g in sodium chloride 0.9 % 100 mL IVPB        2 g 200 mL/hr over 30 Minutes Intravenous  Once 02/06/22 2126 02/06/22 2223   02/06/22 2130  vancomycin (VANCOCIN) IVPB 1000 mg/200 mL premix        1,000 mg 200 mL/hr over 60 Minutes Intravenous  Once 02/06/22 2126 02/06/22 2254      Culture/Microbiology    Component Value Date/Time   SDES WOUND 02/10/2022 1131   SPECREQUEST  LEFT AC JOINT 02/10/2022 1131   CULT  02/10/2022 1131    FEW STAPHYLOCOCCUS AUREUS SUSCEPTIBILITIES TO FOLLOW CULTURE REINCUBATED FOR BETTER GROWTH Performed at [facility], [address] [place], [place] [address]    REPTSTATUS PENDING 02/10/2022 1131  Other culture-see note  Radiology Studies: DG CHEST PORT 1 VIEW  Result Date: 02/12/2022 CLINICAL DATA:  Pneumonia. EXAM: PORTABLE CHEST 1 VIEW COMPARISON:  Feb 09, 2022. FINDINGS: Stable cardiomediastinal silhouette. Increased right upper lobe airspace opacity is noted most consistent with pneumonia. Smaller patchy opacities are noted throughout both lungs also most consistent with pneumonia. Bony thorax is unremarkable. IMPRESSION: Findings consistent with multifocal pneumonia bilaterally, with significantly increased opacity seen in right upper lobe consistent with worsening focal infiltrate. Electronically Signed   By: Lupita Raider M.D.   On: 02/12/2022 10:17   ECHO TEE  Result Date: 02/11/2022    TRANSESOPHOGEAL ECHO REPORT   Patient Name:   DAROTHY COURTRIGHT Date of Exam: 02/11/2022 Medical Rec #:  [id number]         Height:       60.0 in Accession #:    [id number]        Weight:       142.6 lb Date of Birth:  [birth year]         BSA:          1.617 m Patient Age:    30 years          BP:            108/74 mmHg Patient Gender: F                 HR:           100 bpm. Exam Location:  Inpatient Procedure: Transesophageal Echo, Cardiac Doppler, Color Doppler and 3D Echo Indications:    Endocarditis  History:        Patient has prior history of Echocardiogram examinations. Risk                 Factors:Current Smoker. IVDU. MRSA bacteremia. Sepsis.  Sonographer:    Ross Ludwig RDCS (AE) Referring Phys: [id number] Jonita Albee PROCEDURE: After discussion of the risks and benefits of a TEE, an informed consent was obtained from the patient. The transesophogeal probe was passed without difficulty through the esophogus of the patient. Sedation performed by different physician. The patient was monitored while under deep sedation. Anesthestetic sedation was provided intravenously by Anesthesiology: 102.06mg  of Propofol. Image quality was good. The patient's vital signs; including heart rate, blood pressure, and oxygen saturation; remained stable throughout the procedure. The patient developed no complications during the procedure. IMPRESSIONS  1. Left ventricular ejection fraction, by estimation, is 65 to 70%. The left ventricle has normal function. The left ventricle has no regional wall motion abnormalities. There is the interventricular septum is flattened in diastole ('D' shaped left ventricle), consistent with right ventricular volume overload.  2. Right ventricular systolic function is hyperdynamic. The right ventricular size is normal.  3. No left atrial/left atrial appendage thrombus was detected.  4. The mitral valve is normal in structure. No evidence of mitral valve regurgitation. No evidence of mitral stenosis.  5. There is a large mobile multilobed vegetation on the posterior tricuspid leaflet, which appears to be perforated. The largest component of the vegetation measures 23 x 5 mm. The tricuspid valve is abnormal. Tricuspid valve regurgitation is torrential.  6. 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 The aortic valve is normal in structure. Aortic valve regurgitation is not visualized. No aortic stenosis is present. FINDINGS  Left Ventricle: Left ventricular ejection fraction, by estimation, is 65 to 70%. The left ventricle has normal function. The left ventricle has no regional wall motion abnormalities. The left ventricular internal cavity size was normal in size. There is  no left ventricular hypertrophy. The interventricular septum is flattened in diastole ('D' shaped left ventricle), consistent with right ventricular volume overload. Right Ventricle: The right ventricular size is normal. No increase in right ventricular wall thickness. Right ventricular systolic function is hyperdynamic. Left Atrium: Left atrial size was normal in size. No left atrial/left atrial appendage thrombus was detected. Right Atrium: Right atrial size was normal in size. Pericardium: There is no evidence of pericardial effusion. Mitral Valve: The mitral valve is normal in structure. No evidence of mitral valve regurgitation. No evidence of mitral valve stenosis. Tricuspid Valve: There is a large mobile multilobed vegetation on the posterior tricuspid leaflet, which appears to be perforated. The largest component of the vegetation measures 23 x 5 mm. The tricuspid valve is abnormal. Tricuspid valve regurgitation is torrential. No evidence of tricuspid stenosis. 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 The aortic valve is normal in structure. Aortic valve regurgitation is not visualized. No aortic stenosis is present. Pulmonic Valve: The pulmonic valve was normal in structure. Pulmonic valve regurgitation is not visualized. No evidence of pulmonic stenosis. Aorta: The aortic root is normal in size and structure. IAS/Shunts: No atrial  level shunt detected by color flow Doppler.  TRICUSPID VALVE TR Peak grad:   28.9 mmHg TR Vmax:        269.00 cm/s Thurmon Fair MD Electronically signed by Thurmon Fair MD Signature Date/Time: 02/11/2022/3:11:16 PM    Final      LOS: 7 days   Lanae Boast, MD [facility]  02/13/2022, 8:12 AM

## 2022-02-14 DIAGNOSIS — N179 Acute kidney failure, unspecified: Secondary | ICD-10-CM

## 2022-02-14 DIAGNOSIS — R7881 Bacteremia: Secondary | ICD-10-CM | POA: Diagnosis not present

## 2022-02-14 DIAGNOSIS — E8809 Other disorders of plasma-protein metabolism, not elsewhere classified: Secondary | ICD-10-CM

## 2022-02-14 DIAGNOSIS — M009 Pyogenic arthritis, unspecified: Secondary | ICD-10-CM

## 2022-02-14 DIAGNOSIS — D509 Iron deficiency anemia, unspecified: Secondary | ICD-10-CM

## 2022-02-14 DIAGNOSIS — B9562 Methicillin resistant Staphylococcus aureus infection as the cause of diseases classified elsewhere: Secondary | ICD-10-CM | POA: Diagnosis not present

## 2022-02-14 HISTORY — DX: Iron deficiency anemia, unspecified: D50.9

## 2022-02-14 HISTORY — DX: Other disorders of plasma-protein metabolism, not elsewhere classified: E88.09

## 2022-02-14 HISTORY — DX: Acute kidney failure, unspecified: N17.9

## 2022-02-14 LAB — BLOOD CULTURE ID PANEL (REFLEXED) - BCID2

## 2022-02-14 LAB — COMPREHENSIVE METABOLIC PANEL
ALT: 22 U/L (ref 0–44)
AST: 23 U/L (ref 15–41)
Albumin: <1.5 g/dL — ABNORMAL LOW (ref 3.5–5.0)
Alkaline Phosphatase: 101 U/L (ref 38–126)
Anion gap: 7 (ref 5–15)
BUN: 37 mg/dL — ABNORMAL HIGH (ref 6–20)
CO2: 21 mmol/L — ABNORMAL LOW (ref 22–32)
Calcium: 7.1 mg/dL — ABNORMAL LOW (ref 8.9–10.3)
Chloride: 108 mmol/L (ref 98–111)
Creatinine, Ser: 2.06 mg/dL — ABNORMAL HIGH (ref 0.44–1.00)
GFR, Estimated: 32 mL/min — ABNORMAL LOW (ref >60)
Glucose, Bld: 101 mg/dL — ABNORMAL HIGH (ref 70–99)
Potassium: 4.4 mmol/L (ref 3.5–5.1)
Sodium: 136 mmol/L (ref 135–145)
Total Bilirubin: 1.4 mg/dL — ABNORMAL HIGH (ref 0.3–1.2)
Total Protein: 5.3 g/dL — ABNORMAL LOW (ref 6.5–8.1)

## 2022-02-14 LAB — GLUCOSE, CAPILLARY
Glucose-Capillary: 163 mg/dL — ABNORMAL HIGH (ref 70–99)
Glucose-Capillary: 106 mg/dL — ABNORMAL HIGH (ref 70–99)
Glucose-Capillary: 100 mg/dL — ABNORMAL HIGH (ref 70–99)
Glucose-Capillary: 110 mg/dL — ABNORMAL HIGH (ref 70–99)

## 2022-02-14 LAB — URINALYSIS, ROUTINE W REFLEX MICROSCOPIC
Bilirubin Urine: NEGATIVE
Glucose, UA: NEGATIVE mg/dL
Ketones, ur: NEGATIVE mg/dL
Nitrite: NEGATIVE
Protein, ur: 100 mg/dL — AB
RBC / HPF: >50 RBC/hpf — ABNORMAL HIGH (ref 0–5)
Specific Gravity, Urine: 1.014 (ref 1.005–1.030)
WBC, UA: >50 WBC/hpf — ABNORMAL HIGH (ref 0–5)
pH: 5 (ref 5.0–8.0)

## 2022-02-14 LAB — CULTURE, BLOOD (ROUTINE X 2): Special Requests: ADEQUATE

## 2022-02-14 LAB — PROTEIN / CREATININE RATIO, URINE
Creatinine, Urine: 58.8 mg/dL
Protein Creatinine Ratio: 6.12 mg/mg{Cre} — ABNORMAL HIGH (ref 0.00–0.15)
Total Protein, Urine: 360 mg/dL

## 2022-02-14 LAB — HCV RNA QUANT
HCV Quantitative Log: 3.597 log10 IU/mL (ref >1.70)
HCV Quantitative: 3950 IU/mL (ref >50)

## 2022-02-14 LAB — CBC
HCT: 22.5 % — ABNORMAL LOW (ref 36.0–46.0)
Hemoglobin: 7.3 g/dL — ABNORMAL LOW (ref 12.0–15.0)
MCH: 23.8 pg — ABNORMAL LOW (ref 26.0–34.0)
MCHC: 32.4 g/dL (ref 30.0–36.0)
MCV: 73.3 fL — ABNORMAL LOW (ref 80.0–100.0)
Platelets: 358 10*3/uL (ref 150–400)
RBC: 3.07 MIL/uL — ABNORMAL LOW (ref 3.87–5.11)
RDW: 23.9 % — ABNORMAL HIGH (ref 11.5–15.5)
WBC: 26.8 10*3/uL — ABNORMAL HIGH (ref 4.0–10.5)
nRBC: 0 % (ref 0.0–0.2)

## 2022-02-14 LAB — MAGNESIUM: Magnesium: 2 mg/dL (ref 1.7–2.4)

## 2022-02-14 MED ORDER — ALBUMIN HUMAN 25 % IV SOLN
12.5000 g | Freq: Once | INTRAVENOUS | Status: AC
  Administered 2022-02-14: 12.5 g via INTRAVENOUS
  Filled 2022-02-14: qty 50

## 2022-02-14 MED ORDER — FUROSEMIDE 10 MG/ML IJ SOLN
40.0000 mg | Freq: Once | INTRAMUSCULAR | Status: AC
  Administered 2022-02-14: 40 mg via INTRAVENOUS
  Filled 2022-02-14: qty 4

## 2022-02-14 MED ORDER — SODIUM CHLORIDE 0.9 % IV SOLN
400.0000 mg | Freq: Three times a day (TID) | INTRAVENOUS | Status: DC
  Administered 2022-02-14 – 2022-02-15 (×3): 400 mg via INTRAVENOUS
  Filled 2022-02-14 (×5): qty 20

## 2022-02-14 NOTE — Progress Notes (Signed)
[facility]  Date of Admission:  02/06/2022      Total days of antibiotics 8  Daptomycin + Ceftaroline 5/21 >> current          ASSESSMENT: DEVRA KOPINSKI is a 31 y.o. 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  Continue Daptomycin + Ceftaroline combination. Repeat blood cultures drawn 5/19 still growing 1/4 bottles; repeated again. Non-operative candidate for valve replacement.   T11 - early osteomyelitis changes new from study 1 week prior noted on Thoracic spine @ T11. No neurologic compromise, no epidural abscess noted. We have ID of bug with blood cultures so no further work up needed. Will need to follow neurologic exam to ensure no development of epidural abscess given ongoing bacteremia that has been challenging to clear.   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 Can defer for outpatient care after acute issues resolved. RPR and HIV non-reactive.     PLAN: Follow pending blood cultures Continue ceftaroline and daptomycin Follow neurologic exams     Principal Problem:   MRSA bacteremia Active Problems:   Sepsis ([place])   Septic pulmonary embolism without acute cor pulmonale (HCC)   Endocarditis of tricuspid valve   Staphylococcal arthritis of left shoulder (HCC)   Hyponatremia   Elevated LFTs   IVDU (intravenous drug user)   Left shoulder pain   Chest pain, pleuritic   Osteomyelitis (HCC)   Normocytic anemia   Microcytic anemia   Hypoalbuminemia   AKI (acute kidney injury) (HCC)    acetaminophen  1,000 mg Oral Q6H   bisacodyl  10 mg Oral Daily   Chlorhexidine Gluconate Cloth  6 each Topical Daily   clonazePAM  0.5 mg Oral BID   docusate sodium  100  mg Oral BID   enoxaparin (LOVENOX) injection  40 mg Subcutaneous Q24H   mouth rinse  15 mL Mouth Rinse BID   metoprolol tartrate  12.5 mg Oral BID   mupirocin ointment   Nasal BID   nicotine  14 mg Transdermal Daily   QUEtiapine  25 mg Oral QHS   sodium chloride flush  3 mL Intravenous Q12H    SUBJECTIVE: Tired    Review of Systems: Review of Systems  Constitutional:  Negative for chills and fever.  Musculoskeletal:  Positive for back pain and neck pain.    Allergies  Allergen Reactions   Doxycycline Other (See Comments)    REACTION: Joint Pain    OBJECTIVE: Vitals:   02/13/22 1637 02/13/22 2000 02/14/22 0331 02/14/22 0814  BP: 115/86   112/77  Pulse: (!) 102   95  Resp: 18   (!) 33  Temp: 97.6 F (36.4 C) 98.9 F (37.2 C) 98.7 F (37.1 C) 98.9 F (37.2 C)  TempSrc: Oral Oral Oral Oral  SpO2: 96%  95% 96%  Weight:      Height:       Body mass index is 30.05 kg/m.    Physical Exam Vitals reviewed.  Constitutional:      Appearance: She is not ill-appearing.     Comments: Sitting in bed under covers. Looks uncomfortable.   HENT:     Mouth/Throat:     Mouth: Mucous  membranes are moist.     Pharynx: Oropharynx is clear.  Cardiovascular:     Rate and Rhythm: Normal rate and regular rhythm.  Pulmonary:     Effort: Pulmonary effort is normal.     Breath sounds: Normal breath sounds.     Comments: On room air, tachypnea  Skin:    General: Skin is warm and dry.  Neurological:     Mental Status: She is alert and oriented to person, place, and time.    Lab Results Lab Results  Component Value Date   WBC 26.8 (H) 02/14/2022   HGB 7.3 (L) 02/14/2022   HCT 22.5 (L) 02/14/2022   MCV 73.3 (L) 02/14/2022   PLT 358 02/14/2022    Lab Results  Component Value Date   CREATININE 2.06 (H) 02/14/2022   BUN 37 (H) 02/14/2022   NA 136 02/14/2022   K 4.4 02/14/2022   CL 108 02/14/2022   CO2 21 (L) 02/14/2022    Lab Results  Component Value Date   ALT 22  02/14/2022   AST 23 02/14/2022   ALKPHOS 101 02/14/2022   BILITOT 1.4 (H) 02/14/2022     Microbiology: Recent Results (from the past 240 hour(s))  Blood culture (routine x 2)     Status: Abnormal   Collection Time: 02/06/22  8:15 PM   Specimen: BLOOD  Result Value Ref Range Status   Specimen Description   Final    BLOOD RIGHT ANTECUBITAL Performed at [facility], [place] [address], [place], [place] [address]    Special Requests   Final    BOTTLES DRAWN AEROBIC ONLY Blood Culture results may not be optimal due to an inadequate volume of blood received in culture bottles Performed at [facility] [address], [place], [place] [address]    Culture  Setup Time   Final    GRAM POSITIVE COCCI IN CLUSTERS AEROBIC BOTTLE ONLY    Culture (A)  Final    STAPHYLOCOCCUS AUREUS SUSCEPTIBILITIES PERFORMED ON PREVIOUS CULTURE WITHIN THE LAST 5 DAYS. Performed at [facility], [place] [address] [place], [place] [address]    Report Status 02/09/2022 FINAL  Final  Blood culture (routine x 2)     Status: Abnormal   Collection Time: 02/06/22  8:20 PM   Specimen: BLOOD  Result Value Ref Range Status   Specimen Description   Final    BLOOD BLOOD RIGHT HAND Performed at [facility] [address] [place], [place] [address]    Special Requests   Final    BOTTLES DRAWN AEROBIC AND ANAEROBIC Blood Culture results may not be optimal due to an excessive volume of blood received in culture bottles Performed at [facility] [address] [place], [place] [address]    Culture  Setup Time   Final    GRAM POSITIVE COCCI IN CLUSTERS IN BOTH AEROBIC AND ANAEROBIC BOTTLES CRITICAL RESULT CALLED TO, READ BACK BY AND VERIFIED WITH: Sanda Klein [id number] AT 1205 BY CM Performed at [facility], [place] [address], [place],  [address]    Culture METHICILLIN RESISTANT STAPHYLOCOCCUS AUREUS (A)  Final   Report  Status 02/09/2022 FINAL  Final   Organism ID, Bacteria METHICILLIN RESISTANT STAPHYLOCOCCUS AUREUS  Final      Susceptibility   Methicillin resistant staphylococcus aureus - MIC*    CIPROFLOXACIN >=8 RESISTANT Resistant     ERYTHROMYCIN >=8 RESISTANT Resistant     GENTAMICIN <=0.5  SENSITIVE Sensitive     OXACILLIN >=4 RESISTANT Resistant     TETRACYCLINE <=1 SENSITIVE Sensitive     VANCOMYCIN 1 SENSITIVE Sensitive     TRIMETH/SULFA 160 RESISTANT Resistant     CLINDAMYCIN <=0.25 SENSITIVE Sensitive     RIFAMPIN <=0.5 SENSITIVE Sensitive     Inducible Clindamycin NEGATIVE Sensitive     * METHICILLIN RESISTANT STAPHYLOCOCCUS AUREUS  Blood Culture ID Panel (Reflexed)     Status: Abnormal   Collection Time: 02/06/22  8:20 PM  Result Value Ref Range Status   Enterococcus faecalis NOT DETECTED NOT DETECTED Final   Enterococcus Faecium NOT DETECTED NOT DETECTED Final   Listeria monocytogenes NOT DETECTED NOT DETECTED Final   Staphylococcus species DETECTED (A) NOT DETECTED Final    Comment: CRITICAL RESULT CALLED TO, READ BACK BY AND VERIFIED WITH: PHARMD M BELL [id number] AT 1205 BY CM    Staphylococcus aureus (BCID) DETECTED (A) NOT DETECTED Final    Comment: Methicillin (oxacillin)-resistant Staphylococcus aureus (MRSA). MRSA is predictably resistant to beta-lactam antibiotics (except ceftaroline). Preferred therapy is vancomycin unless clinically contraindicated. Patient requires contact precautions if  hospitalized. CRITICAL RESULT CALLED TO, READ BACK BY AND VERIFIED WITH: PHARMD M BELL [id number] AT 1205 BY CM    Staphylococcus epidermidis NOT DETECTED NOT DETECTED Final   Staphylococcus lugdunensis NOT DETECTED NOT DETECTED Final   Streptococcus species NOT DETECTED NOT DETECTED Final   Streptococcus agalactiae NOT DETECTED NOT DETECTED Final   Streptococcus pneumoniae NOT DETECTED NOT DETECTED Final   Streptococcus pyogenes NOT DETECTED NOT DETECTED Final   A.calcoaceticus-baumannii NOT  DETECTED NOT DETECTED Final   Bacteroides fragilis NOT DETECTED NOT DETECTED Final   Enterobacterales NOT DETECTED NOT DETECTED Final   Enterobacter cloacae complex NOT DETECTED NOT DETECTED Final   Escherichia coli NOT DETECTED NOT DETECTED Final   Klebsiella aerogenes NOT DETECTED NOT DETECTED Final   Klebsiella oxytoca NOT DETECTED NOT DETECTED Final   Klebsiella pneumoniae NOT DETECTED NOT DETECTED Final   Proteus species NOT DETECTED NOT DETECTED Final   Salmonella species NOT DETECTED NOT DETECTED Final   Serratia marcescens NOT DETECTED NOT DETECTED Final   Haemophilus influenzae NOT DETECTED NOT DETECTED Final   Neisseria meningitidis NOT DETECTED NOT DETECTED Final   Pseudomonas aeruginosa NOT DETECTED NOT DETECTED Final   Stenotrophomonas maltophilia NOT DETECTED NOT DETECTED Final   Candida albicans NOT DETECTED NOT DETECTED Final   Candida auris NOT DETECTED NOT DETECTED Final   Candida glabrata NOT DETECTED NOT DETECTED Final   Candida krusei NOT DETECTED NOT DETECTED Final   Candida parapsilosis NOT DETECTED NOT DETECTED Final   Candida tropicalis NOT DETECTED NOT DETECTED Final   Cryptococcus neoformans/gattii NOT DETECTED NOT DETECTED Final   Meth resistant mecA/C and MREJ DETECTED (A) NOT DETECTED Final    Comment: CRITICAL RESULT CALLED TO, READ BACK BY AND VERIFIED WITH: Sanda Klein [id number] AT 1205 BY CM Performed at [facility], [address], [place], [place] [address]   Resp Panel by RT-PCR (Flu A&B, Covid) Peripheral     Status: None   Collection Time: 02/06/22  8:38 PM   Specimen: Peripheral; Nasopharyngeal(NP) swabs in vial transport medium  Result Value Ref Range Status   SARS Coronavirus 2 by RT PCR NEGATIVE NEGATIVE Final    Comment: (NOTE) SARS-CoV-2 target nucleic acids are NOT DETECTED.  The SARS-CoV-2 RNA is generally detectable in upper respiratory specimens during the acute phase of infection. The lowest concentration of SARS-CoV-2 viral  copies  this assay can detect is 138 copies/mL. A negative result does not preclude SARS-Cov-2 infection and should not be used as the sole basis for treatment or other patient management decisions. A negative result may occur with  improper specimen collection/handling, submission of specimen other than nasopharyngeal swab, presence of viral mutation(s) within the areas targeted by this assay, and inadequate number of viral copies(<138 copies/mL). A negative result must be combined with clinical observations, patient history, and epidemiological information. The expected result is Negative.  Fact Sheet for Patients:  EntrepreneurPulse.com.au  Fact Sheet for Healthcare Providers:  IncredibleEmployment.be  This test is no t yet approved or cleared by the [place] FDA and  has been authorized for detection and/or diagnosis of SARS-CoV-2 by FDA under an Emergency Use Authorization (EUA). This EUA will remain  in effect (meaning this test can be used) for the duration of the COVID-19 declaration under Section 564(b)(1) of the Act, 21 U.S.C.section 360bbb-3(b)(1), unless the authorization is terminated  or revoked sooner.       Influenza A by PCR NEGATIVE NEGATIVE Final   Influenza B by PCR NEGATIVE NEGATIVE Final    Comment: (NOTE) The Xpert Xpress SARS-CoV-2/FLU/RSV plus assay is intended as an aid in the diagnosis of influenza from Nasopharyngeal swab specimens and should not be used as a sole basis for treatment. Nasal washings and aspirates are unacceptable for Xpert Xpress SARS-CoV-2/FLU/RSV testing.  Fact Sheet for Patients: EntrepreneurPulse.com.au  Fact Sheet for Healthcare Providers: IncredibleEmployment.be  This test is not yet approved or cleared by the [place] FDA and has been authorized for detection and/or diagnosis of SARS-CoV-2 by FDA under an Emergency Use Authorization (EUA). This  EUA will remain in effect (meaning this test can be used) for the duration of the COVID-19 declaration under Section 564(b)(1) of the Act, 21 U.S.C. section 360bbb-3(b)(1), unless the authorization is terminated or revoked.  Performed at [facility], [place] [address], [place], [place] [address]   Urine Culture     Status: Abnormal   Collection Time: 02/07/22 12:23 AM   Specimen: In/Out Cath Urine  Result Value Ref Range Status   Specimen Description   Final    IN/OUT CATH URINE Performed at [facility] [address], [place], [place] [address]    Special Requests   Final    NONE Performed at [facility], [place] [address], [place],  [address]    Culture (A)  Final    >=100,000 COLONIES/mL METHICILLIN RESISTANT STAPHYLOCOCCUS AUREUS   Report Status 02/09/2022 FINAL  Final   Organism ID, Bacteria METHICILLIN RESISTANT STAPHYLOCOCCUS AUREUS (A)  Final      Susceptibility   Methicillin resistant staphylococcus aureus - MIC*    CIPROFLOXACIN >=8 RESISTANT Resistant     GENTAMICIN <=0.5 SENSITIVE Sensitive     NITROFURANTOIN <=16 SENSITIVE Sensitive     OXACILLIN >=4 RESISTANT Resistant     TETRACYCLINE <=1 SENSITIVE Sensitive     VANCOMYCIN 1 SENSITIVE Sensitive     TRIMETH/SULFA >=320 RESISTANT Resistant     CLINDAMYCIN <=0.25 SENSITIVE Sensitive     RIFAMPIN <=0.5 SENSITIVE Sensitive     Inducible Clindamycin NEGATIVE Sensitive     * >=100,000 COLONIES/mL METHICILLIN RESISTANT STAPHYLOCOCCUS AUREUS  MRSA Next Gen by PCR, Nasal     Status: Abnormal   Collection Time: 02/07/22 12:43 AM   Specimen: Nasal Mucosa; Nasal Swab  Result Value Ref Range Status   MRSA by PCR Next Gen DETECTED (A) NOT  DETECTED Final    Comment: CRITICAL RESULT CALLED TO, READ BACK BY AND VERIFIED WITH: CALLED TO ZAHOUREK,A AT NO:[id number] ON 02/07/22 BY VAZQUEZ,J (NOTE) The GeneXpert MRSA Assay (FDA approved for NASAL specimens only), is one  component of a comprehensive MRSA colonization surveillance program. It is not intended to diagnose MRSA infection nor to guide or monitor treatment for MRSA infections. Test performance is not FDA approved in patients less than 75 years old. Performed at [facility], [place] [address] [place], [place] [address]   Culture, blood (single) w Reflex to ID Panel     Status: Abnormal   Collection Time: 02/07/22  6:58 AM   Specimen: BLOOD  Result Value Ref Range Status   Specimen Description   Final    BLOOD BLOOD LEFT WRIST Performed at [facility] [address] [place], [place] [address]    Special Requests   Final    IN PEDIATRIC BOTTLE Blood Culture adequate volume Performed at [facility] [address], [place], [place] [address]    Culture  Setup Time   Final    GRAM POSITIVE COCCI IN CLUSTERS IN PEDIATRIC BOTTLE CRITICAL VALUE NOTED.  VALUE IS CONSISTENT WITH PREVIOUSLY REPORTED AND CALLED VALUE.    Culture (A)  Final    STAPHYLOCOCCUS AUREUS SUSCEPTIBILITIES PERFORMED ON PREVIOUS CULTURE WITHIN THE LAST 5 DAYS. Performed at [facility], [place] [address] [place], [place] [address]    Report Status 02/09/2022 FINAL  Final  Culture, blood (Routine X 2) w Reflex to ID Panel     Status: Abnormal   Collection Time: 02/08/22  2:58 AM   Specimen: BLOOD  Result Value Ref Range Status   Specimen Description   Final    BLOOD BLOOD LEFT HAND Performed at [facility] [address], [place], [place] [address]    Special Requests   Final    IN PEDIATRIC BOTTLE Blood Culture adequate volume Performed at [facility] [address], [place], [place] [address]    Culture  Setup Time   Final    GRAM POSITIVE COCCI IN CLUSTERS AEROBIC BOTTLE ONLY CRITICAL VALUE NOTED.  VALUE IS CONSISTENT WITH PREVIOUSLY REPORTED AND CALLED VALUE.    Culture (A)  Final    STAPHYLOCOCCUS  AUREUS SUSCEPTIBILITIES PERFORMED ON PREVIOUS CULTURE WITHIN THE LAST 5 DAYS. Performed at [facility], [place] [address] [place], [place] [address]    Report Status 02/10/2022 FINAL  Final  Culture, blood (Routine X 2) w Reflex to ID Panel     Status: Abnormal (Preliminary result)   Collection Time: 02/08/22  2:59 AM   Specimen: BLOOD  Result Value Ref Range Status   Specimen Description   Final    BLOOD RIGHT ANTECUBITAL Performed at [facility] [address], [place], [place] [address]    Special Requests   Final    IN PEDIATRIC BOTTLE Blood Culture adequate volume Performed at [facility] [address] [place], [place] [address]    Culture  Setup Time   Final    GRAM POSITIVE COCCI IN CLUSTERS AEROBIC BOTTLE ONLY CRITICAL VALUE NOTED.  VALUE IS CONSISTENT WITH PREVIOUSLY REPORTED AND CALLED VALUE.    Culture (A)  Final    STAPHYLOCOCCUS AUREUS Sent to [facility] for further susceptibility testing. Performed at [facility], [place] [address], [place], [place] [address]    Report Status PENDING  Incomplete  Min Inhibitory Conc (2 Drugs)     Status: None   Collection Time: 02/08/22  2:59 AM  Result Value Ref Range Status   Min Inhibitory Conc (2 Drugs) Preliminary report  Final    Comment: (NOTE) Performed At: [facility] [place], [place] [id number] Rush Farmer MD [id number]    Source ([place]) BLOOD  Final    Comment: Performed at [facility], [place] [address], [place], [place] [address]  MIC Results (2 Drugs)     Status: None   Collection Time: 02/08/22  2:59 AM  Result Value Ref Range Status   RESULT 5 MIC (RESULT 1)                Base Name: Staphylococcus aureus  Final    Comment: (NOTE) Identification performed by account, not confirmed by this laboratory. Performed At: [facility] [place], [place] [id number] Rush Farmer MD [id number]    Culture, blood (Routine X 2) w Reflex to ID Panel     Status: Abnormal   Collection Time: 02/10/22  6:43 AM   Specimen: BLOOD  Result Value Ref Range Status   Specimen Description BLOOD LEFT ANTECUBITAL  Final   Special Requests   Final    BOTTLES DRAWN AEROBIC AND ANAEROBIC Blood Culture adequate volume   Culture  Setup Time   Final    GRAM POSITIVE COCCI IN CLUSTERS IN BOTH AEROBIC AND ANAEROBIC BOTTLES CRITICAL RESULT CALLED TO, READ BACK BY AND VERIFIED WITH: Carleene Mains [id number] FCP Performed at [facility], [place] [address], [place], [place] [address]    Culture METHICILLIN RESISTANT STAPHYLOCOCCUS AUREUS (A)  Final   Report Status 02/14/2022 FINAL  Final   Organism ID, Bacteria METHICILLIN RESISTANT STAPHYLOCOCCUS AUREUS  Final      Susceptibility   Methicillin resistant staphylococcus aureus - MIC*    CIPROFLOXACIN >=8 RESISTANT Resistant     ERYTHROMYCIN >=8 RESISTANT Resistant     GENTAMICIN <=0.5 SENSITIVE Sensitive     OXACILLIN >=4 RESISTANT Resistant     TETRACYCLINE <=1 SENSITIVE Sensitive     VANCOMYCIN 1 SENSITIVE Sensitive     TRIMETH/SULFA >=320 RESISTANT Resistant     CLINDAMYCIN <=0.25 SENSITIVE Sensitive     RIFAMPIN <=0.5 SENSITIVE Sensitive     Inducible Clindamycin NEGATIVE Sensitive     * METHICILLIN RESISTANT STAPHYLOCOCCUS AUREUS  Blood Culture ID Panel (Reflexed)     Status: Abnormal   Collection Time: 02/10/22  6:43 AM  Result Value Ref Range Status   Enterococcus faecalis NOT DETECTED NOT DETECTED Final   Enterococcus Faecium NOT DETECTED NOT DETECTED Final   Listeria monocytogenes NOT DETECTED NOT DETECTED Final   Staphylococcus species DETECTED (A) NOT DETECTED Final    Comment: CRITICAL RESULT CALLED TO, READ BACK BY AND VERIFIED WITH: PHARMD RACHEL S 1403 [id number] FCP    Staphylococcus aureus (BCID) DETECTED (A) NOT DETECTED Final    Comment: Methicillin (oxacillin)-resistant Staphylococcus aureus (MRSA). MRSA is predictably  resistant to beta-lactam antibiotics (except ceftaroline). Preferred therapy is vancomycin unless clinically contraindicated. Patient requires contact precautions if  hospitalized. CRITICAL RESULT CALLED TO, READ BACK BY AND VERIFIED WITH: PHARMD RACHEL S 1403 [id number] FCP    Staphylococcus epidermidis NOT DETECTED NOT DETECTED Final   Staphylococcus lugdunensis NOT DETECTED NOT DETECTED Final   Streptococcus species NOT DETECTED NOT DETECTED Final   Streptococcus agalactiae NOT DETECTED NOT DETECTED Final   Streptococcus pneumoniae NOT DETECTED NOT DETECTED Final  Streptococcus pyogenes NOT DETECTED NOT DETECTED Final   A.calcoaceticus-baumannii NOT DETECTED NOT DETECTED Final   Bacteroides fragilis NOT DETECTED NOT DETECTED Final   Enterobacterales NOT DETECTED NOT DETECTED Final   Enterobacter cloacae complex NOT DETECTED NOT DETECTED Final   Escherichia coli NOT DETECTED NOT DETECTED Final   Klebsiella aerogenes NOT DETECTED NOT DETECTED Final   Klebsiella oxytoca NOT DETECTED NOT DETECTED Final   Klebsiella pneumoniae NOT DETECTED NOT DETECTED Final   Proteus species NOT DETECTED NOT DETECTED Final   Salmonella species NOT DETECTED NOT DETECTED Final   Serratia marcescens NOT DETECTED NOT DETECTED Final   Haemophilus influenzae NOT DETECTED NOT DETECTED Final   Neisseria meningitidis NOT DETECTED NOT DETECTED Final   Pseudomonas aeruginosa NOT DETECTED NOT DETECTED Final   Stenotrophomonas maltophilia NOT DETECTED NOT DETECTED Final   Candida albicans NOT DETECTED NOT DETECTED Final   Candida auris NOT DETECTED NOT DETECTED Final   Candida glabrata NOT DETECTED NOT DETECTED Final   Candida krusei NOT DETECTED NOT DETECTED Final   Candida parapsilosis NOT DETECTED NOT DETECTED Final   Candida tropicalis NOT DETECTED NOT DETECTED Final   Cryptococcus neoformans/gattii NOT DETECTED NOT DETECTED Final   Meth resistant mecA/C and MREJ DETECTED (A) NOT DETECTED Final    Comment:  CRITICAL RESULT CALLED TO, READ BACK BY AND VERIFIED WITH: Fabian Sharp 1403 [id number] FCP Performed at [facility], [address] [place], [place] [address]   Culture, blood (Routine X 2) w Reflex to ID Panel     Status: Abnormal (Preliminary result)   Collection Time: 02/10/22  6:53 AM   Specimen: BLOOD  Result Value Ref Range Status   Specimen Description BLOOD LEFT ANTECUBITAL  Final   Special Requests   Final    BOTTLES DRAWN AEROBIC AND ANAEROBIC Blood Culture adequate volume   Culture  Setup Time   Final    GRAM POSITIVE COCCI IN CLUSTERS IN BOTH AEROBIC AND ANAEROBIC BOTTLES CRITICAL RESULT CALLED TO, READ BACK BY AND VERIFIED WITH: PHARMD EDEN Chester ON 02/13/22 @ 1538 BY DRT    Culture (A)  Final    STAPHYLOCOCCUS AUREUS SUSCEPTIBILITIES PERFORMED ON PREVIOUS CULTURE WITHIN THE LAST 5 DAYS. Performed at [facility], [place] [address] [place], [place] [address]    Report Status PENDING  Incomplete  Surgical PCR screen     Status: Abnormal   Collection Time: 02/10/22  9:22 AM   Specimen: Nasal Mucosa; Nasal Swab  Result Value Ref Range Status   MRSA, PCR POSITIVE (A) NEGATIVE Final    Comment: RESULT CALLED TO, READ BACK BY AND VERIFIED WITH: OLVIA MARTIN @1503   DL    Staphylococcus aureus POSITIVE (A) NEGATIVE Final    Comment: (NOTE) The Xpert SA Assay (FDA approved for NASAL specimens in patients 40 years of age and older), is one component of a comprehensive surveillance program. It is not intended to diagnose infection nor to guide or monitor treatment. Performed at [facility], [place] [address], [place], [place] [address]   Aerobic/Anaerobic Culture w Gram Stain (surgical/deep wound)     Status: None (Preliminary result)   Collection Time: 02/10/22 11:31 AM   Specimen: PATH Other; Tissue  Result Value Ref Range Status   Specimen Description WOUND  Final   Special Requests LEFT AC JOINT  Final   Gram Stain   Final    RARE WBC  PRESENT,BOTH PMN AND MONONUCLEAR RARE GRAM POSITIVE COCCI IN CLUSTERS Performed at  [facility], [place] [address], [place], [place] [address]    Culture FEW METHICILLIN RESISTANT STAPHYLOCOCCUS AUREUS  Final   Report Status PENDING  Incomplete   Organism ID, Bacteria METHICILLIN RESISTANT STAPHYLOCOCCUS AUREUS  Final      Susceptibility   Methicillin resistant staphylococcus aureus - MIC*    CIPROFLOXACIN >=8 RESISTANT Resistant     ERYTHROMYCIN >=8 RESISTANT Resistant     GENTAMICIN <=0.5 SENSITIVE Sensitive     OXACILLIN >=4 RESISTANT Resistant     TETRACYCLINE <=1 SENSITIVE Sensitive     VANCOMYCIN <=0.5 SENSITIVE Sensitive     TRIMETH/SULFA >=320 RESISTANT Resistant     CLINDAMYCIN <=0.25 SENSITIVE Sensitive     RIFAMPIN <=0.5 SENSITIVE Sensitive     Inducible Clindamycin NEGATIVE Sensitive     * FEW METHICILLIN RESISTANT STAPHYLOCOCCUS AUREUS  Culture, blood (Routine X 2) w Reflex to ID Panel     Status: None (Preliminary result)   Collection Time: 02/13/22  1:19 AM   Specimen: BLOOD LEFT HAND  Result Value Ref Range Status   Specimen Description BLOOD LEFT HAND  Final   Special Requests AEROBIC BOTTLE ONLY Blood Culture adequate volume  Final   Culture   Final    NO GROWTH 1 DAY Performed at [facility], [address], [place], [place] [address]    Report Status PENDING  Incomplete  Culture, blood (Routine X 2) w Reflex to ID Panel     Status: None (Preliminary result)   Collection Time: 02/13/22  1:38 AM   Specimen: BLOOD RIGHT HAND  Result Value Ref Range Status   Specimen Description BLOOD RIGHT HAND  Final   Special Requests   Final    BOTTLES DRAWN AEROBIC AND ANAEROBIC Blood Culture adequate volume   Culture   Final    NO GROWTH 1 DAY Performed at [facility], [address], [place], [place] [address]    Report Status PENDING  Incomplete     Janene Madeira, MSN, NP-C [facility] [email or web address] Pager: [phone] Office: [phone] [facility] Main Line: [place] Communication Welcome

## 2022-02-14 NOTE — Consult Note (Signed)
Neurosurgery Consultation  Reason for Consult: Thoracic osteomyelitis Referring Physician: Kc  CC: Back pain, MRSA bacteremia  HPI: This is a 31 y.o. woman that originally prseented with chest pain, cough, fevers in the setting of IVDU. She was diagnosed with complicated MRSA bacteremia with septic shock, septic emboli, AKI,  endocarditis w/ large tricuspid vegetation, septic joints s/p L AC rsxn and debridement, on ceftaroline and dapto. MRI T-spine was negative on 5/19 but technically limited, repeat study performed yesterday that showed new osteomyelitis. No new weakness, numbness, or parasthesias except some occasional L ulnar distribution numbness that was present prior to her hospitalization.   ROS: A 14 point ROS was performed and is negative except as noted in the HPI.   PMHx:  Past Medical History:  Diagnosis Date   Asthma    Drug abuse (HCC)    FamHx: History reviewed. No pertinent family history. SocHx:  reports that she has been smoking cigarettes. She has been smoking an average of 1 pack per day. She does not have any smokeless tobacco history on file. She reports that she does not drink alcohol and does not use drugs.  Exam: Vital signs in last 24 hours: Temp:  [97.6 F (36.4 C)-98.9 F (37.2 C)] 98.9 F (37.2 C) (05/26 0814) Pulse Rate:  [95-105] 95 (05/26 0814) Resp:  [14-40] 33 (05/26 0814) BP: (106-115)/(59-86) 112/77 (05/26 0814) SpO2:  [95 %-98 %] 96 % (05/26 0814) General: Awake, alert, cooperative, lying in bed, appears acutely ill Head: \ormocephalic and atruamatic HEENT: Neck supple Pulmonary: breathing room air comfortably, no evidence of increased work of breathing Cardiac: tachycardic, regular Abdomen: S NT ND Extremities: Warm and well perfused x4 Neuro: AOx3, PERRL, EOMI, FS Strength 5/5 x4, SILTx4, reflexes 2+, mildly inc'd tone diffusely, ankle clonus sustained on the left, two beats on the right, +right hoffman's, none on left   Assessment  and Plan: 32 y.o. woman w/ IVDU and bacteremia with septic emboli / endocarditis / AKI and large infectious burden. MRI T-spine personally reviewed, which shows new T2 hyperintensity in the anteroinferior aspect of T11, given the recent negative MRI, likely new osteomyelitis.   -no acute neurosurgical intervention indicated, recommend medical treatment with antibiotics -MRI C-spine unremarkable but does have some long-tract signs. Given her h/o septic emboli, could have septic cerebral emboli but hoffman's less common with intracranial pathology compared to cervical spine pathology. Her MRI C-spine does have some T2 change that appears artifactual in the left side of the upper cord on the left. If she has any symptoms of ataxia or difficulty with fine motor function, could consider neurology consult for further evaluation.  -please call with any concerns or questions  Jadene Pierini, MD 02/14/22 11:03 AM [facility]

## 2022-02-14 NOTE — Consult Note (Signed)
Reason for Consult: Acute kidney injury Referring Physician: Antonieta Pert MD [facility])  HPI:  31 year old woman with past medical history significant for childhood asthma and intravenous drug use who presented to the emergency room 1 week ago with fever, cough, chest pain and left-sided shoulder pain.  Additional evaluation showed that she had MRSA bacteremia with tricuspid valve endocarditis, septic pulmonary emboli, early T11 osteomyelitis and septic left AC joint status post I&D on 5/22 (metastatic MRSA infection).  She is on antibiotic therapy with daptomycin and ceftaroline and is not a surgical candidate for valve replacement.  Incidentally also found to have hepatitis C virus infection.  Baseline creatinine appears to have been 0.5-0.7, yesterday rose to 1.1 and today is 2.1.  Urine output not accurately charted.  She was previously on intravenous ketorolac for pain management that was decreased yesterday and discontinued today.  She has been on intravenous fluids and is net +20.6 L with weight increase of 18 kg since admission.  Past Medical History:  Diagnosis Date   Asthma    Drug abuse [facility])     Past Surgical History:  Procedure Laterality Date   IRRIGATION AND DEBRIDEMENT SHOULDER Left 02/10/2022   Procedure: IRRIGATION AND DEBRIDEMENT SHOULDER;  Surgeon: Meredith Pel, MD;  Location: [facility];  Service: Orthopedics;  Laterality: Left;   TEE WITHOUT CARDIOVERSION N/A 02/11/2022   Procedure: TRANSESOPHAGEAL ECHOCARDIOGRAM (TEE);  Surgeon: Sanda Klein, MD;  Location: [facility] ENDOSCOPY;  Service: Cardiovascular;  Laterality: N/A;    History reviewed. No pertinent family history.  Social History:  reports that she has been smoking cigarettes. She has been smoking an average of 1 pack per day. She does not have any smokeless tobacco history on file. She reports that she does not drink alcohol and does not use drugs.  Allergies:  Allergies  Allergen Reactions   Doxycycline Other (See  Comments)    REACTION: Joint Pain    Medications: I have reviewed the patient's current medications. Scheduled:  acetaminophen  1,000 mg Oral Q6H   bisacodyl  10 mg Oral Daily   Chlorhexidine Gluconate Cloth  6 each Topical Daily   clonazePAM  0.5 mg Oral BID   docusate sodium  100 mg Oral BID   enoxaparin (LOVENOX) injection  40 mg Subcutaneous Q24H   mouth rinse  15 mL Mouth Rinse BID   metoprolol tartrate  12.5 mg Oral BID   mupirocin ointment   Nasal BID   nicotine  14 mg Transdermal Daily   QUEtiapine  25 mg Oral QHS   sodium chloride flush  3 mL Intravenous Q12H   Continuous:  sodium chloride 125 mL/hr at 02/14/22 0930   ceFTAROline (TEFLARO) IV 400 mg (02/14/22 1251)   DAPTOmycin (CUBICIN)  IV Stopped (02/13/22 2043)   methocarbamol (ROBAXIN) IV         Latest Ref Rng & Units 02/14/2022    5:51 AM 02/13/2022    1:19 AM 02/12/2022    2:24 AM  BMP  Glucose 70 - 99 mg/dL 101   108   120    BUN 6 - 20 mg/dL 37   24   24    Creatinine 0.44 - 1.00 mg/dL 2.06   1.12   0.98    Sodium 135 - 145 mmol/L 136   136   136    Potassium 3.5 - 5.1 mmol/L 4.4   3.7   3.6    Chloride 98 - 111 mmol/L 108   110   109  CO2 22 - 32 mmol/L 21   20   22     Calcium 8.9 - 10.3 mg/dL 7.1   6.9   6.9        Latest Ref Rng & Units 02/14/2022    5:51 AM 02/13/2022    1:19 AM 02/12/2022    2:24 AM  CBC  WBC 4.0 - 10.5 K/uL 26.8   24.4   21.0    Hemoglobin 12.0 - 15.0 g/dL 7.3   8.2   8.3    Hematocrit 36.0 - 46.0 % 22.5   24.7   25.2    Platelets 150 - 400 K/uL 358   287   212     Urinalysis    Component Value Date/Time   COLORURINE YELLOW 02/07/2022 0023   APPEARANCEUR CLEAR 02/07/2022 0023   LABSPEC 1.002 (L) 02/07/2022 0023   PHURINE 6.0 02/07/2022 0023   GLUCOSEU NEGATIVE 02/07/2022 0023   HGBUR MODERATE (A) 02/07/2022 0023   BILIRUBINUR NEGATIVE 02/07/2022 0023   KETONESUR NEGATIVE 02/07/2022 0023   PROTEINUR NEGATIVE 02/07/2022 0023   UROBILINOGEN 0.2 02/24/2012 2301    NITRITE NEGATIVE 02/07/2022 0023   LEUKOCYTESUR NEGATIVE 02/07/2022 0023      MR CERVICAL SPINE W WO CONTRAST  Result Date: 02/13/2022 CLINICAL DATA:  Mid back pain, worsening. Persistent bacteremia, evaluate for spinal infection EXAM: MRI CERVICAL AND THORACIC SPINE WITHOUT AND WITH CONTRAST TECHNIQUE: Multiplanar and multiecho pulse sequences of the cervical spine, to include the craniocervical junction and cervicothoracic junction, and the thoracic spine, were obtained without and with intravenous contrast. CONTRAST:  78mL GADAVIST GADOBUTROL 1 MMOL/ML IV SOLN COMPARISON:  02/07/2022 FINDINGS: MRI CERVICAL SPINE FINDINGS Very motion degraded Alignment: Physiologic. Vertebrae: No fracture, evidence of discitis, or bone lesion. Cord: Normal signal and morphology.  No evidence of collection Posterior Fossa, vertebral arteries, paraspinal tissues: Subcutaneous edema which is generalized. Disc levels: No evidence of herniation or impingement. MRI THORACIC SPINE FINDINGS Alignment:  Normal Vertebrae: New anterior inferior corner edema at T11. No disc T2 hyperintensity or endplate destruction. Cord:  Normal signal and morphology.  No evidence of cord collection Paraspinal and other soft tissues: Extensive bilateral pneumonia with cavitary features and complicated bilateral pleural effusion. Cavitation and pleural fluid is progressed from comparison CT 02/07/2022 Disc levels: No herniation, impingement, or facet spurring IMPRESSION: 1. New area of marrow edema at the anterior inferior corner of T11, likely early osteomyelitis/apophysitis. 2. No evidence of cervical spine infection. 3. Negative for cervicothoracic canal collection. 4. Partially covered bilateral septic pneumonia and complex pleural effusions, progressed from chest CT 02/07/2022. Electronically Signed   By: Jorje Guild M.D.   On: 02/13/2022 13:02   MR THORACIC SPINE W WO CONTRAST  Result Date: 02/13/2022 CLINICAL DATA:  Mid back pain,  worsening. Persistent bacteremia, evaluate for spinal infection EXAM: MRI CERVICAL AND THORACIC SPINE WITHOUT AND WITH CONTRAST TECHNIQUE: Multiplanar and multiecho pulse sequences of the cervical spine, to include the craniocervical junction and cervicothoracic junction, and the thoracic spine, were obtained without and with intravenous contrast. CONTRAST:  8mL GADAVIST GADOBUTROL 1 MMOL/ML IV SOLN COMPARISON:  02/07/2022 FINDINGS: MRI CERVICAL SPINE FINDINGS Very motion degraded Alignment: Physiologic. Vertebrae: No fracture, evidence of discitis, or bone lesion. Cord: Normal signal and morphology.  No evidence of collection Posterior Fossa, vertebral arteries, paraspinal tissues: Subcutaneous edema which is generalized. Disc levels: No evidence of herniation or impingement. MRI THORACIC SPINE FINDINGS Alignment:  Normal Vertebrae: New anterior inferior corner edema at T11. No disc T2  hyperintensity or endplate destruction. Cord:  Normal signal and morphology.  No evidence of cord collection Paraspinal and other soft tissues: Extensive bilateral pneumonia with cavitary features and complicated bilateral pleural effusion. Cavitation and pleural fluid is progressed from comparison CT 02/07/2022 Disc levels: No herniation, impingement, or facet spurring IMPRESSION: 1. New area of marrow edema at the anterior inferior corner of T11, likely early osteomyelitis/apophysitis. 2. No evidence of cervical spine infection. 3. Negative for cervicothoracic canal collection. 4. Partially covered bilateral septic pneumonia and complex pleural effusions, progressed from chest CT 02/07/2022. Electronically Signed   By: Jorje Guild M.D.   On: 02/13/2022 13:02    Review of Systems Blood pressure (!) 133/95, pulse (!) 104, temperature 97.9 F (36.6 C), temperature source Oral, resp. rate 16, height 5' (1.524 m), weight 69.8 kg, SpO2 91 %. Physical Exam Vitals and nursing note reviewed.  Constitutional:      Appearance: She  is normal weight. She is ill-appearing.     Comments: Initially alert but falls asleep during conversation  HENT:     Head: Normocephalic and atraumatic.     Right Ear: External ear normal.     Left Ear: External ear normal.     Nose: Nose normal.     Mouth/Throat:     Mouth: Mucous membranes are moist.     Pharynx: Oropharynx is clear.  Eyes:     Extraocular Movements: Extraocular movements intact.     Conjunctiva/sclera: Conjunctivae normal.  Cardiovascular:     Rate and Rhythm: Regular rhythm. Tachycardia present.     Heart sounds: Murmur heard.     Comments: 3/6 holosystolic Pulmonary:     Effort: Pulmonary effort is normal.     Breath sounds: Rales present. No wheezing.  Abdominal:     General: Abdomen is flat. Bowel sounds are normal.     Palpations: Abdomen is soft.     Tenderness: There is no guarding.  Musculoskeletal:     Cervical back: Normal range of motion and neck supple.  Skin:    General: Skin is warm and dry.     Coloration: Skin is pale.  Neurological:     Mental Status: She is alert and oriented to person, place, and time.  Psychiatric:        Mood and Affect: Mood normal.    Assessment/Plan: 1.  Acute kidney injury: Likely hemodynamically mediated in the setting of NSAID use but cannot entirely rule out infection related GN.  There was no dipstick proteinuria seen and urine microscopy did not show any RBCs however she was dipstick positive for blood.  I agree with indefinite discontinuation of ketorolac and will send off for a renal ultrasound and complement levels to assess for possible infection related GN.  Based on her physical exam, I will discontinue her intravenous fluids and give a bolus dose of albumin/furosemide to help augment urine output. Avoid nephrotoxic medications including NSAIDs and iodinated intravenous contrast exposure unless the latter is absolutely indicated.  Preferred narcotic agents for pain control are hydromorphone, fentanyl, and  methadone. Morphine should not be used. Avoid Baclofen and avoid oral sodium phosphate and magnesium citrate based laxatives / bowel preps. Continue strict Input and Output monitoring. Will monitor the patient closely with you and intervene or adjust therapy as indicated by changes in clinical status/labs.  2.  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  On daptomycin and ceftaroline. 3.  Anemia: Likely secondary to acute/critical  illness, monitor trend and assess for overt blood loss.  No indication for PRBC transfusion at this time. 4.  Anasarca/hypoalbuminemia: I will quantify urine protein/creatinine ratio again for verification purposes and suspect that her low albumin is primarily driven by her acute illness/negative acute phase reaction.  Discontinue intravenous fluids, will give challenge of IV albumin/Lasix.  Lecretia Buczek K. 02/14/2022, 2:47 PM

## 2022-02-14 NOTE — Progress Notes (Signed)
PROGRESS NOTE Carol Rivera  [id number] DOB: [birth year] DOA: 02/06/2022 PCP: Pcp, No   Brief Narrative/Hospital Course: 31 y.o. F W/ history significant for childhood asthma and intravenous drug abuse presented with fever, chest pain, cough and left shoulder pain. In ED-febrile, tachycardic.  CXR and left shoulder x-ray was negative for acute findings. Sodium 128, alkaline phosphatase 163, AST 257, ALT 454, total bilirubin of 6.9 with WBCs of 17,900 and lactic acid of 4.  COVID-19 and influenza testing negative.  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  MRI of the spine without obvious abscess/phlegmon or osseous involvement.subsequently transferred from [facility] to [facility] 5/22 for TEE and CT surgery evaluation. TEE--Large tricuspid vegetation, not accessible via angiovac per CTS on 5/22, also not a candidate for valve replacement given recent IV drug use -Seen by Orthopedic Surgery for septic AC joints, now s/p L shoulder AC resection and extensive debridement on 5/22. Sent to ICU and now back to [facility].     Subjective: Seen and examined this morning.  Patient reports no new complaints he still has chest pain left shoulder pain and upper back pain-but not worse feels better from before. Overnight patient has been afebrile heart rate in 90s, saturating well on room air, remains tachypneic variable rate 18-33. Labs with worsening WBC count, worsening creatinine-despite decreasing Toradol and adding IV fluids IVF. Of note she is net positive significantly with significant weight gain  Assessment and Plan: Principal Problem:   MRSA bacteremia Active Problems:   Sepsis ([place])   Hyponatremia   Elevated LFTs   IVDU (intravenous drug user)   Left shoulder pain    Chest pain, pleuritic   Septic pulmonary embolism without acute cor pulmonale (HCC)   Endocarditis of tricuspid valve   Staphylococcal arthritis of left shoulder (HCC)   Osteomyelitis (HCC)   Normocytic anemia   Microcytic anemia   Hypoalbuminemia   AKI (acute kidney injury) ([place])  Severe sepsis POA due to MRSA infection  MRSA tricuspid valve endocarditis-with large vegetation/destruction and severe TR: Mid and upper back pain Persistent bacteremia Bacterial septic emboli-multifocal pneumonia T11 Early osteomyelitis: MRSA Sepsis/bacteremia endocarditis left AC septic arthritis in the setting of IV drug abuse.  Persistent bacteremia repeat blood culture 5/25 NGTD. TEE- large tricuspid vegetation CT surgery eval 5/22 not accessible via angiovac, and not a candidate for valve replacement given recent IV drug abuse and valve destruction. ID following On daptomycin, Teflaro.  Remains hemodynamically stable but with worsening leukocytosis.  Left AC joint culture MRSA sensitive to vancomycin/clinda, resistant to Bactrim.T and C-spine MRI -new area of marrow edema at anterior-inferior corner of T11, likely early osteomyelitis/apophysitis, no evidence of cervical spine infection or cervical thoracic canal collection-spoke with Dr. Venetia Constable who is going to evaluate her.ID following.  Recent Labs  Lab 02/07/22 1429 02/07/22 1722 02/08/22 0304 02/08/22 0848 02/09/22 0340 02/09/22 1655 02/11/22 0136 02/12/22 0224 02/13/22 0119 02/14/22 0551  WBC  --   --    < >  --  20.5*  --  32.2* 21.0* 24.4* 26.8*  LATICACIDVEN 2.9* 2.9*  --   --   --  1.6  --   --   --   --   PROCALCITON  --   --   --  14.88 11.87  --   --   --   --   --    < > =  values in this interval not displayed.   IVDA Acute metabolic encephalopathy on admission: mental status improved alert awake oriented.  Significant pain disease given with PTs.  Continue Klonopin low-dose twice daily,bedtime Seroquel prn pain meds.  TOC consult  when able to help with drug cessation/rehab.  Hepatitis C antibody positive: Counseling when able.  HIV/RPR/hep B negative. Hepc ab antibody +, repeating hep c quantitive RNA 5/25.  Continue antianxiety with Klonopin, as needed Xanax/Ativan, pain control with oxy and low-dose Dilaudid, bedtime seroquel-given patient's significant opiates use  Septic left AC joint: Status post I&D 5/22, continue pain control dressing per orthopedics.  Pain control with oral and IV opiates, discontinue Toradol given AKI. Dr Marlou Sa planning for "open distal clavicle excision and debridement of the [facility] joint".  Anemia likely from acute on chronic disease, acute illness sepsis, also female of menstruating age: Hemoglobin downtrending further transfuse if less than 7 g. Recent Labs  Lab 02/09/22 0340 02/11/22 0136 02/12/22 0224 02/13/22 0119 02/14/22 0551  HGB 10.6* 9.5* 8.3* 8.2* 7.3*  HCT 32.7* 27.5* 25.2* 24.7* 22.5*    Hyponatremia: Resolved Elevated LFTs: Likely from sepsis.  Improved Chest pain, pleuritic: Continue pain control.  Respiratory status is stable although tachypneic   AKI/ATN: creat slowly worsening-we Increase IV fluids,stopped Toradol which was lowerd yesterday- has been having significant pain.Likely multifactorial in the setting of overwhelming infection/ATN, meds, severe hypoalbuminemia-with decreased oncotic pressure. Has had no hypotension.  Patient has net positive gain with increasing weight once AKI resolves will need diuresis. Will dose iv albumin.  will consult nephrology- Dr Posey Pronto.Net IO Since Admission: 20,615.14 mL [02/14/22 0841]  Recent Labs  Lab 02/10/22 0653 02/11/22 0618 02/12/22 0224 02/13/22 0119 02/14/22 0551  BUN 12 18 24* 24* 37*  CREATININE 0.62 0.70 0.98 1.12* 2.06*   Severe hypoalbuminemia: Augment nutritional status, dietitian consulted. Will dose iv albumin   Class I Obesity:Patient's Body mass index is 30.05 kg/m. : Will benefit with PCP follow-up, weight  loss  healthy lifestyle and outpatient follow-up.  DVT prophylaxis: SCDs Start: 02/10/22 1422 Place TED hose Start: 02/10/22 1422 Place and maintain sequential compression device Start: 02/09/22 1132 enoxaparin (LOVENOX) injection 40 mg Start: 02/07/22 1000 Code Status:   Code Status: Full Code Family Communication: plan of care discussed with patient at bedside.  Patient reports her family has been visiting her.  Boyfriend is only allowed televisit Patient status is: Inpatient because of ongoing management of bacteremia endocarditis Level of care: Telemetry Medical   Dispo: The patient is from: home            Anticipated disposition: TBD  Mobility Assessment (last 72 hours)     Mobility Assessment     Row Name 02/13/22 20:13:30           Does patient have an order for bedrest or is patient medically unstable No - Continue assessment       What is the highest level of mobility based on the progressive mobility assessment? Level 5 (Walks with assist in room/hall) - Balance while stepping forward/back and can walk in room with assist - Complete               Objective: Vitals last 24 hrs: Vitals:   02/13/22 1637 02/13/22 2000 02/14/22 0331 02/14/22 0814  BP: 115/86   112/77  Pulse: (!) 102   95  Resp: 18   (!) 33  Temp: 97.6 F (36.4 C) 98.9 F (37.2 C) 98.7 F (37.1 C) 98.9 F (37.2  C)  TempSrc: Oral Oral Oral Oral  SpO2: 96%  95% 96%  Weight:      Height:       Weight change:   Physical Examination: General exam: AAo, ill looking, mild distress older than stated age, weak appearing. HEENT:Oral mucosa moist, Ear/Nose WNL grossly, dentition normal. Respiratory system: bilaterally crackles, no use of accessory muscle Cardiovascular system: S1 & S2 +, murmur present, no JVD,. Gastrointestinal system: Abdomen soft,NT,ND,BS+ Nervous System:Alert, awake, moving  her b/l legs and arm,  Extremities: LE ankle edema trace,distal peripheral pulses palpable.  Skin: No  rashes,no icterus. MSK: Normal muscle bulk,tone, power   Medications reviewed:  Scheduled Meds:  acetaminophen  1,000 mg Oral Q6H   bisacodyl  10 mg Oral Daily   Chlorhexidine Gluconate Cloth  6 each Topical Daily   clonazePAM  0.5 mg Oral BID   docusate sodium  100 mg Oral BID   enoxaparin (LOVENOX) injection  40 mg Subcutaneous Q24H   mouth rinse  15 mL Mouth Rinse BID   metoprolol tartrate  12.5 mg Oral BID   mupirocin ointment   Nasal BID   nicotine  14 mg Transdermal Daily   QUEtiapine  25 mg Oral QHS   sodium chloride flush  3 mL Intravenous Q12H   Continuous Infusions:  sodium chloride Stopped (02/13/22 1922)   ceFTAROline (TEFLARO) IV     DAPTOmycin (CUBICIN)  IV Stopped (02/13/22 2043)   methocarbamol (ROBAXIN) IV        Diet Order             Diet regular Room service appropriate? Yes; Fluid consistency: Thin  Diet effective now                            Intake/Output Summary (Last 24 hours) at 02/14/2022 0841 Last data filed at 02/14/2022 0427 Gross per 24 hour  Intake 741.64 ml  Output --  Net 741.64 ml   Net IO Since Admission: 20,615.14 mL [02/14/22 0841]  Wt Readings from Last 3 Encounters:  02/13/22 69.8 kg  11/30/15 79.4 kg  02/25/12 90.3 kg     Unresulted Labs (From admission, onward)     Start     Ordered   02/16/22 0500  CK  Once-Timed,   R       Question:  Specimen collection method  Answer:  Lab=Lab collect   02/09/22 1009   02/14/22 0500  Creatinine, serum  (enoxaparin (LOVENOX)    CrCl >/= 30 ml/min)  Weekly,   R     Comments: while on enoxaparin therapy   Question:  Specimen collection method  Answer:  Lab=Lab collect   02/07/22 0427   02/13/22 0500  HCV RNA quant  Tomorrow morning,   R       Question:  Specimen collection method  Answer:  Lab=Lab collect   02/12/22 1537   02/11/22 0500  Magnesium  Daily,   R     Question:  Specimen collection method  Answer:  Lab=Lab collect   02/10/22 0815   02/11/22 0500  CBC  Daily,    R     Question:  Specimen collection method  Answer:  Lab=Lab collect   02/10/22 0815   02/11/22 [id number]  Basic metabolic panel  Daily,   R     Question:  Specimen collection method  Answer:  Lab=Lab collect   02/10/22 0815          Data  Reviewed: I have personally reviewed following labs and imaging studies CBC: Recent Labs  Lab 02/09/22 0340 02/11/22 0136 02/12/22 0224 02/13/22 0119 02/14/22 0551  WBC 20.5* 32.2* 21.0* 24.4* 26.8*  HGB 10.6* 9.5* 8.3* 8.2* 7.3*  HCT 32.7* 27.5* 25.2* 24.7* 22.5*  MCV 74.7* 72.2* 73.7* 74.2* 73.3*  PLT 149* 159 212 287 [id number]   Basic Metabolic Panel: Recent Labs  Lab 02/08/22 0304 02/09/22 0340 02/10/22 0653 02/11/22 0618 02/12/22 0224 02/13/22 0119 02/14/22 0551  NA 134*   < > 134* 137 136 136 136  K 4.1   < > 3.5 3.4* 3.6 3.7 4.4  CL 101   < > 104 110 109 110 108  CO2 26   < > 20* 19* 22 20* 21*  GLUCOSE 94   < > 55* 86 120* 108* 101*  BUN 10   < > 12 18 24* 24* 37*  CREATININE 0.53   < > 0.62 0.70 0.98 1.12* 2.06*  CALCIUM 8.0*   < > 7.2* 7.1* 6.9* 6.9* 7.1*  MG 1.4*  --  2.0 2.1 2.1 2.1 2.0  PHOS 2.4*  --  4.1  --   --   --   --    < > = values in this interval not displayed.   GFR: Estimated Creatinine Clearance: 34.5 mL/min (A) (by C-G formula based on SCr of 2.06 mg/dL (H)). Liver Function Tests: Recent Labs  Lab 02/10/22 0653 02/11/22 0618 02/12/22 0224 02/13/22 0119 02/14/22 0551  AST 45* 43* 34 27 23  ALT 69* 46* 36 28 22  ALKPHOS 186* 105 107 102 101  BILITOT 6.3* 4.8* 2.0* 1.4* 1.4*  PROT 5.6* 5.2* 5.1* 5.1* 5.3*  ALBUMIN <1.5* 1.6* <1.5* <1.5* <1.5*   No results for input(s): LIPASE, AMYLASE in the last 168 hours. No results for input(s): AMMONIA in the last 168 hours. Coagulation Profile: No results for input(s): INR, PROTIME in the last 168 hours.  BNP (last 3 results) No results for input(s): PROBNP in the last 8760 hours. HbA1C: No results for input(s): HGBA1C in the last 72 hours. CBG: Recent  Labs  Lab 02/12/22 1705 02/13/22 0658 02/13/22 1312 02/13/22 2141 02/14/22 0819  GLUCAP 129* 138* 99 103* 163*   Lipid Profile: No results for input(s): CHOL, HDL, LDLCALC, TRIG, CHOLHDL, LDLDIRECT in the last 72 hours. Thyroid Function Tests: No results for input(s): TSH, T4TOTAL, FREET4, T3FREE, THYROIDAB in the last 72 hours. Sepsis Labs: Recent Labs  Lab 02/07/22 1429 02/07/22 1722 02/08/22 0848 02/09/22 0340 02/09/22 1655  PROCALCITON  --   --  14.88 11.87  --   LATICACIDVEN 2.9* 2.9*  --   --  1.6  Antimicrobials: Anti-infectives (From admission, onward)    Start     Dose/Rate Route Frequency Ordered Stop   02/14/22 1400  ceftaroline (TEFLARO) 400 mg in sodium chloride 0.9 % 100 mL IVPB        400 mg 100 mL/hr over 60 Minutes Intravenous Every 8 hours 02/14/22 0726     02/10/22 1152  vancomycin (VANCOCIN) powder  Status:  Discontinued          As needed 02/10/22 1153 02/10/22 1242   02/09/22 1400  ceftaroline (TEFLARO) 600 mg in sodium chloride 0.9 % 100 mL IVPB  Status:  Discontinued        600 mg 100 mL/hr over 60 Minutes Intravenous Every 8 hours 02/09/22 0955 02/14/22 0726   02/09/22 1200  DAPTOmycin (CUBICIN) 500 mg in sodium chloride 0.9 %  IVPB        8 mg/kg  61.2 kg 120 mL/hr over 30 Minutes Intravenous Daily 02/09/22 0955     02/07/22 2200  Vancomycin (VANCOCIN) 1,250 mg in sodium chloride 0.9 % 250 mL IVPB  Status:  Discontinued        1,250 mg 166.7 mL/hr over 90 Minutes Intravenous Every 24 hours 02/07/22 0434 02/07/22 1645   02/07/22 2200  vancomycin (VANCOREADY) IVPB 1250 mg/250 mL  Status:  Discontinued        1,250 mg 166.7 mL/hr over 90 Minutes Intravenous Every 24 hours 02/07/22 1645 02/09/22 0955   02/07/22 1000  metroNIDAZOLE (FLAGYL) IVPB 500 mg  Status:  Discontinued        500 mg 100 mL/hr over 60 Minutes Intravenous Every 12 hours 02/07/22 0427 02/07/22 1231   02/07/22 0600  ceFEPIme (MAXIPIME) 2 g in sodium chloride 0.9 % 100 mL IVPB   Status:  Discontinued        2 g 200 mL/hr over 30 Minutes Intravenous Every 8 hours 02/07/22 0434 02/07/22 1231   02/06/22 2130  metroNIDAZOLE (FLAGYL) IVPB 500 mg        500 mg 100 mL/hr over 60 Minutes Intravenous  Once 02/06/22 2121 02/06/22 2254   02/06/22 2130  ceFEPIme (MAXIPIME) 2 g in sodium chloride 0.9 % 100 mL IVPB        2 g 200 mL/hr over 30 Minutes Intravenous  Once 02/06/22 2126 02/06/22 2223   02/06/22 2130  vancomycin (VANCOCIN) IVPB 1000 mg/200 mL premix        1,000 mg 200 mL/hr over 60 Minutes Intravenous  Once 02/06/22 2126 02/06/22 2254      Culture/Microbiology    Component Value Date/Time   SDES BLOOD RIGHT HAND 02/13/2022 0138   SPECREQUEST  02/13/2022 0138    BOTTLES DRAWN AEROBIC AND ANAEROBIC Blood Culture adequate volume   CULT  02/13/2022 0138    NO GROWTH 1 DAY Performed at [facility], [place] [address] [place], [place] [address]    REPTSTATUS PENDING 02/13/2022 0138  Other culture-see note  Radiology Studies: MR CERVICAL SPINE W WO CONTRAST  Result Date: 02/13/2022 CLINICAL DATA:  Mid back pain, worsening. Persistent bacteremia, evaluate for spinal infection EXAM: MRI CERVICAL AND THORACIC SPINE WITHOUT AND WITH CONTRAST TECHNIQUE: Multiplanar and multiecho pulse sequences of the cervical spine, to include the craniocervical junction and cervicothoracic junction, and the thoracic spine, were obtained without and with intravenous contrast. CONTRAST:  77mL GADAVIST GADOBUTROL 1 MMOL/ML IV SOLN COMPARISON:  02/07/2022 FINDINGS: MRI CERVICAL SPINE FINDINGS Very motion degraded Alignment: Physiologic. Vertebrae: No fracture, evidence of discitis, or bone lesion. Cord: Normal signal and morphology.  No evidence of collection Posterior Fossa, vertebral arteries, paraspinal tissues: Subcutaneous edema which is generalized. Disc levels: No evidence of herniation or impingement. MRI THORACIC SPINE FINDINGS Alignment:  Normal Vertebrae: New anterior inferior  corner edema at T11. No disc T2 hyperintensity or endplate destruction. Cord:  Normal signal and morphology.  No evidence of cord collection Paraspinal and other soft tissues: Extensive bilateral pneumonia with cavitary features and complicated bilateral pleural effusion. Cavitation and pleural fluid is progressed from comparison CT 02/07/2022 Disc levels: No herniation, impingement, or facet spurring IMPRESSION: 1. New area of marrow edema at the anterior inferior corner of T11, likely early osteomyelitis/apophysitis. 2. No evidence of cervical spine infection. 3. Negative for cervicothoracic canal collection. 4. Partially covered bilateral septic pneumonia and complex pleural effusions, progressed from chest CT 02/07/2022.  Electronically Signed   By: Jorje Guild M.D.   On: 02/13/2022 13:02   MR THORACIC SPINE W WO CONTRAST  Result Date: 02/13/2022 CLINICAL DATA:  Mid back pain, worsening. Persistent bacteremia, evaluate for spinal infection EXAM: MRI CERVICAL AND THORACIC SPINE WITHOUT AND WITH CONTRAST TECHNIQUE: Multiplanar and multiecho pulse sequences of the cervical spine, to include the craniocervical junction and cervicothoracic junction, and the thoracic spine, were obtained without and with intravenous contrast. CONTRAST:  68mL GADAVIST GADOBUTROL 1 MMOL/ML IV SOLN COMPARISON:  02/07/2022 FINDINGS: MRI CERVICAL SPINE FINDINGS Very motion degraded Alignment: Physiologic. Vertebrae: No fracture, evidence of discitis, or bone lesion. Cord: Normal signal and morphology.  No evidence of collection Posterior Fossa, vertebral arteries, paraspinal tissues: Subcutaneous edema which is generalized. Disc levels: No evidence of herniation or impingement. MRI THORACIC SPINE FINDINGS Alignment:  Normal Vertebrae: New anterior inferior corner edema at T11. No disc T2 hyperintensity or endplate destruction. Cord:  Normal signal and morphology.  No evidence of cord collection Paraspinal and other soft tissues:  Extensive bilateral pneumonia with cavitary features and complicated bilateral pleural effusion. Cavitation and pleural fluid is progressed from comparison CT 02/07/2022 Disc levels: No herniation, impingement, or facet spurring IMPRESSION: 1. New area of marrow edema at the anterior inferior corner of T11, likely early osteomyelitis/apophysitis. 2. No evidence of cervical spine infection. 3. Negative for cervicothoracic canal collection. 4. Partially covered bilateral septic pneumonia and complex pleural effusions, progressed from chest CT 02/07/2022. Electronically Signed   By: Jorje Guild M.D.   On: 02/13/2022 13:02   DG CHEST PORT 1 VIEW  Result Date: 02/12/2022 CLINICAL DATA:  Pneumonia. EXAM: PORTABLE CHEST 1 VIEW COMPARISON:  Feb 09, 2022. FINDINGS: Stable cardiomediastinal silhouette. Increased right upper lobe airspace opacity is noted most consistent with pneumonia. Smaller patchy opacities are noted throughout both lungs also most consistent with pneumonia. Bony thorax is unremarkable. IMPRESSION: Findings consistent with multifocal pneumonia bilaterally, with significantly increased opacity seen in right upper lobe consistent with worsening focal infiltrate. Electronically Signed   By: Marijo Conception M.D.   On: 02/12/2022 10:17     LOS: 8 days   Antonieta Pert, MD [facility]  02/14/2022, 8:41 AM

## 2022-02-14 NOTE — Progress Notes (Signed)
PHARMACY NOTE:  ANTIMICROBIAL RENAL DOSAGE ADJUSTMENT  Current antimicrobial regimen includes a mismatch between antimicrobial dosage and estimated renal function.  As per policy approved by the Pharmacy & Therapeutics and Medical Executive Committees, the antimicrobial dosage will be adjusted accordingly.  Current antimicrobial dosage:  ceftaroline 600 mg IV q 8h  Indication: MRSA bacteremia  Renal Function:  Estimated Creatinine Clearance: 34.5 mL/min (A) (by C-G formula based on SCr of 2.06 mg/dL (H)). []      On intermittent HD, scheduled: []      On CRRT    Antimicrobial dosage has been changed to:  ceftaroline 400 mg IV q 8h  Thank you for involving pharmacy in this patient's care.  , PharmD PGY1 Ambulatory Care Pharmacy Resident 02/14/2022 7:25 AM  **Pharmacist phone directory can be found on [email or web address] listed under [facility]**

## 2022-02-14 NOTE — TOC Initial Note (Signed)
Transition of Care [facility]) - Initial/Assessment Note    Patient Details  Name: Carol Rivera MRN: [id number] Date of Birth: [birth year]  Transition of [facility]) CM/SW Contact:    Ninfa Meeker, RN Phone Number: 02/14/2022, 10:15 AM  Clinical Narrative:      Transition of Care Screening Note:             Transition of Care [facility]) Department has reviewed patient and no TOC needs have been identified at this time. We will continue to monitor patient advancement through Interdisciplinary progressions and if new patient needs arise, please place a consult.     Expected Discharge Plan: Home/Self Care Barriers to Discharge: No Barriers Identified   Patient Goals and CMS Choice Patient states their goals for this hospitalization and ongoing recovery are:: to go back home CMS Medicare.gov Compare Post Acute Care list provided to:: Patient Choice offered to / list presented to : Patient  Expected Discharge Plan and Services Expected Discharge Plan: Home/Self Care   Discharge Planning Services: CM Consult   Living arrangements for the past 2 months: Single Family Home                                      Prior Living Arrangements/Services Living arrangements for the past 2 months: Single Family Home Lives with:: Self Patient language and need for interpreter reviewed:: Yes Do you feel safe going back to the place where you live?: Yes            Criminal Activity/Legal Involvement Pertinent to Current Situation/Hospitalization: No - Comment as needed  Activities of Daily Living Home Assistive Devices/Equipment: None ADL Screening (condition at time of admission) Patient's cognitive ability adequate to safely complete daily activities?: No Is the patient deaf or have difficulty hearing?: No Does the patient have difficulty seeing, even when wearing glasses/contacts?: No Does the patient have difficulty concentrating, remembering, or making decisions?: No Patient  able to express need for assistance with ADLs?: No Does the patient have difficulty dressing or bathing?: No Independently performs ADLs?: Yes (appropriate for developmental age) Does the patient have difficulty walking or climbing stairs?: No Weakness of Legs: None Weakness of Arms/Hands: None  Permission Sought/Granted                  Emotional Assessment Appearance:: Appears stated age Attitude/Demeanor/Rapport: Engaged Affect (typically observed): Calm Orientation: : Oriented to Place, Oriented to  Time, Oriented to Self, Oriented to Situation Alcohol / Substance Use: Not Applicable Psych Involvement: No (comment)  Admission diagnosis:  Sepsis ([place]) [A41.9] Sepsis with acute organ dysfunction and septic shock, due to unspecified organism, unspecified type ([place]) [A41.9, R65.21] Patient Active Problem List   Diagnosis Date Noted   Microcytic anemia 02/14/2022   Hypoalbuminemia 02/14/2022   AKI (acute kidney injury) ([place]) 02/14/2022   Normocytic anemia 02/13/2022   Staphylococcal arthritis of left shoulder (HCC)    Osteomyelitis ([place])    MRSA bacteremia    Septic pulmonary embolism without acute cor pulmonale ([place])    Endocarditis of tricuspid valve    Hyponatremia 02/07/2022   Elevated LFTs 02/07/2022   IVDU (intravenous drug user) 02/07/2022   Left shoulder pain 02/07/2022   Chest pain, pleuritic 02/07/2022   Sepsis ([place]) 02/06/2022   ASTHMA, PERSISTENT, MILD 07/04/2010   CHRONIC PHARYNGITIS 03/06/2010   SMOKER 02/23/2010   ALLERGIC RHINITIS 01/26/2010   UPPER RESPIRATORY INFECTION, ACUTE, WITH  BRONCHITIS 01/23/2010   ACUTE BRONCHOSPASM 01/23/2010   PCP:  Merryl Hacker No Pharmacy:   [facility] [address] ([address] [place] [id number] [place] ([place]) [place] [address] Phone: [phone] Fax: [phone]     Social Determinants of Health (SDOH) Interventions    Readmission Risk Interventions     View : No data to display.

## 2022-02-14 NOTE — Plan of Care (Signed)
  Problem: Health Behavior/Discharge Planning: Goal: Ability to manage health-related needs will improve Outcome: Progressing   Problem: Elimination: Goal: Will not experience complications related to urinary retention Outcome: Progressing   Problem: Safety: Goal: Ability to remain free from injury will improve Outcome: Progressing   

## 2022-02-15 ENCOUNTER — Inpatient Hospital Stay (HOSPITAL_COMMUNITY): Payer: Medicaid Other

## 2022-02-15 DIAGNOSIS — E8809 Other disorders of plasma-protein metabolism, not elsewhere classified: Secondary | ICD-10-CM

## 2022-02-15 DIAGNOSIS — N179 Acute kidney failure, unspecified: Secondary | ICD-10-CM | POA: Diagnosis not present

## 2022-02-15 DIAGNOSIS — A419 Sepsis, unspecified organism: Secondary | ICD-10-CM | POA: Diagnosis not present

## 2022-02-15 DIAGNOSIS — D509 Iron deficiency anemia, unspecified: Secondary | ICD-10-CM

## 2022-02-15 DIAGNOSIS — D649 Anemia, unspecified: Secondary | ICD-10-CM

## 2022-02-15 DIAGNOSIS — R0781 Pleurodynia: Secondary | ICD-10-CM | POA: Diagnosis not present

## 2022-02-15 DIAGNOSIS — R7881 Bacteremia: Secondary | ICD-10-CM | POA: Diagnosis not present

## 2022-02-15 LAB — RENAL FUNCTION PANEL
Albumin: <1.5 g/dL — ABNORMAL LOW (ref 3.5–5.0)
Anion gap: 7 (ref 5–15)
BUN: 53 mg/dL — ABNORMAL HIGH (ref 6–20)
CO2: 19 mmol/L — ABNORMAL LOW (ref 22–32)
Calcium: 7.2 mg/dL — ABNORMAL LOW (ref 8.9–10.3)
Chloride: 111 mmol/L (ref 98–111)
Creatinine, Ser: 3.05 mg/dL — ABNORMAL HIGH (ref 0.44–1.00)
GFR, Estimated: 20 mL/min — ABNORMAL LOW (ref >60)
Glucose, Bld: 102 mg/dL — ABNORMAL HIGH (ref 70–99)
Phosphorus: 7.1 mg/dL — ABNORMAL HIGH (ref 2.5–4.6)
Potassium: 5.1 mmol/L (ref 3.5–5.1)
Sodium: 137 mmol/L (ref 135–145)

## 2022-02-15 LAB — AEROBIC/ANAEROBIC CULTURE W GRAM STAIN (SURGICAL/DEEP WOUND)

## 2022-02-15 LAB — CBC
HCT: 20.1 % — ABNORMAL LOW (ref 36.0–46.0)
Hemoglobin: 6.7 g/dL — CL (ref 12.0–15.0)
MCH: 24 pg — ABNORMAL LOW (ref 26.0–34.0)
MCHC: 33.3 g/dL (ref 30.0–36.0)
MCV: 72 fL — ABNORMAL LOW (ref 80.0–100.0)
Platelets: 389 10*3/uL (ref 150–400)
RBC: 2.79 MIL/uL — ABNORMAL LOW (ref 3.87–5.11)
RDW: 24 % — ABNORMAL HIGH (ref 11.5–15.5)
WBC: 20.8 10*3/uL — ABNORMAL HIGH (ref 4.0–10.5)
nRBC: 0 % (ref 0.0–0.2)

## 2022-02-15 LAB — LACTATE DEHYDROGENASE: LDH: 201 U/L — ABNORMAL HIGH (ref 98–192)

## 2022-02-15 LAB — CULTURE, BLOOD (ROUTINE X 2): Special Requests: ADEQUATE

## 2022-02-15 LAB — DIRECT ANTIGLOBULIN TEST (NOT AT ARMC)
DAT, IgG: NEGATIVE
DAT, complement: NEGATIVE

## 2022-02-15 LAB — MIN INHIBITORY CONC (2 DRUGS)

## 2022-02-15 LAB — PREPARE RBC (CROSSMATCH)

## 2022-02-15 LAB — MAGNESIUM: Magnesium: 2.1 mg/dL (ref 1.7–2.4)

## 2022-02-15 LAB — ABO/RH: ABO/RH(D): A POS

## 2022-02-15 LAB — HEMOGLOBIN AND HEMATOCRIT, BLOOD
HCT: 28.5 % — ABNORMAL LOW (ref 36.0–46.0)
Hemoglobin: 9.5 g/dL — ABNORMAL LOW (ref 12.0–15.0)

## 2022-02-15 LAB — GLUCOSE, CAPILLARY
Glucose-Capillary: 97 mg/dL (ref 70–99)
Glucose-Capillary: 97 mg/dL (ref 70–99)
Glucose-Capillary: 105 mg/dL — ABNORMAL HIGH (ref 70–99)
Glucose-Capillary: 104 mg/dL — ABNORMAL HIGH (ref 70–99)

## 2022-02-15 LAB — MIC RESULTS (2 DRUGS)

## 2022-02-15 IMAGING — US US RENAL
1 series · 14 of 21 positions shown · non-contrast
Comparison: None Available.

CLINICAL DATA: Acute kidney injury.

EXAM:
RENAL / URINARY TRACT ULTRASOUND COMPLETE

[Series 1: us renal · 14 of 21 slices shown]
[im 1/21]
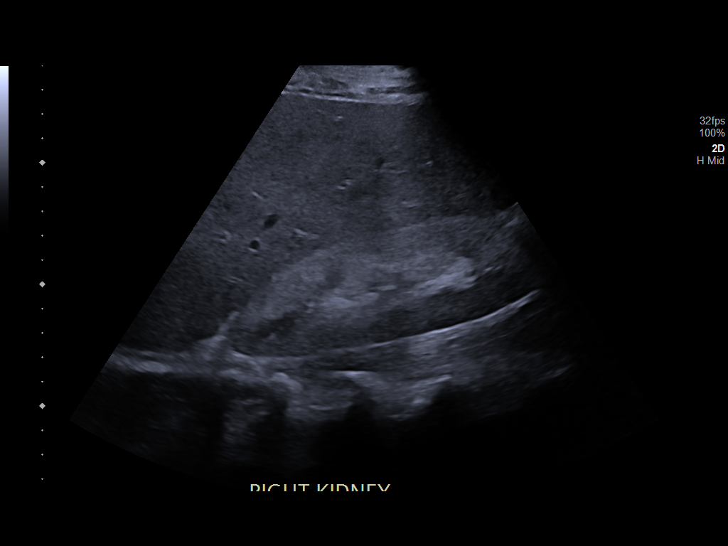
[im 3/21]
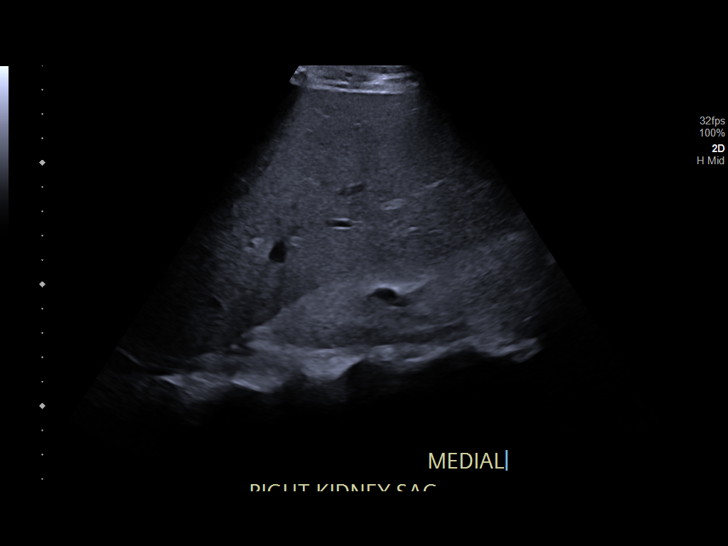
[im 4/21]
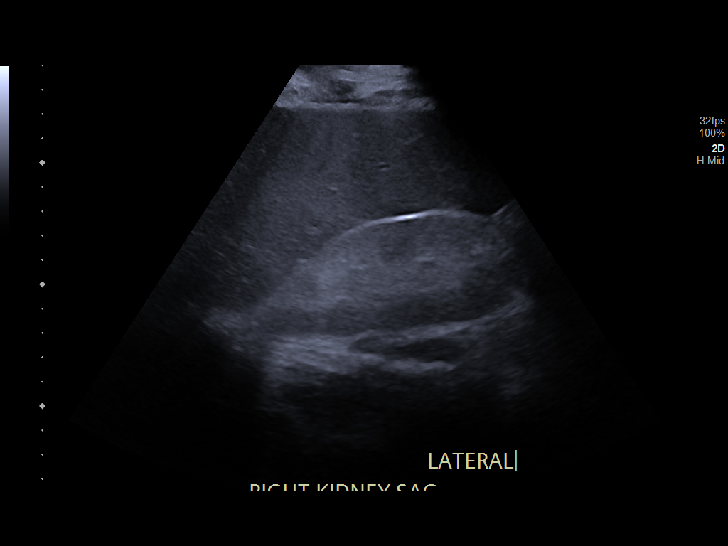
[im 6/21]
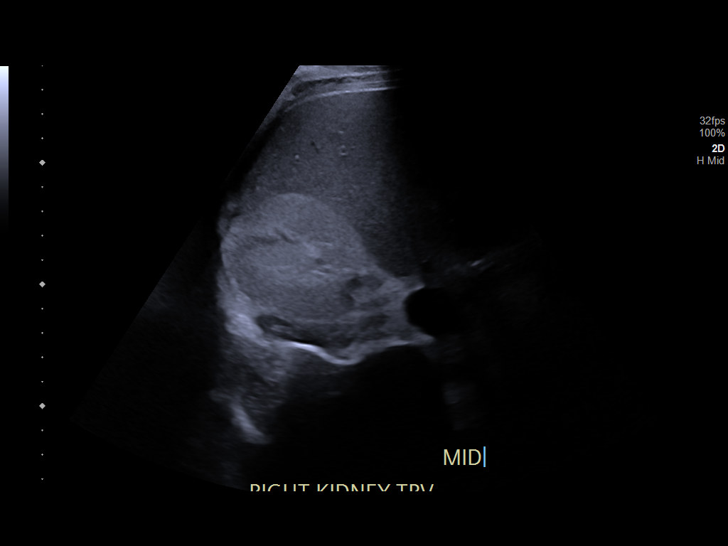
[im 7/21]
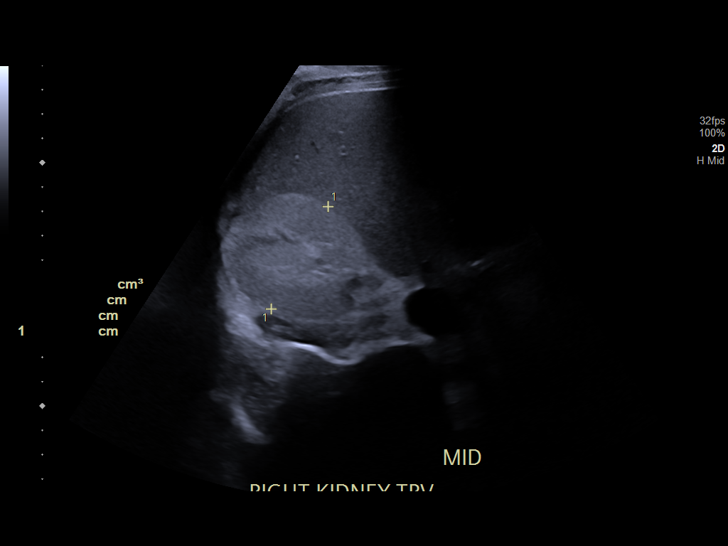
[im 9/21]
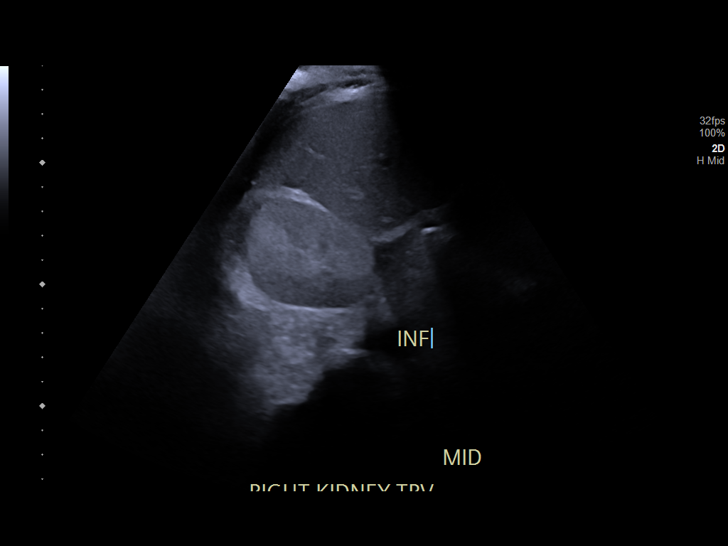
[im 10/21]
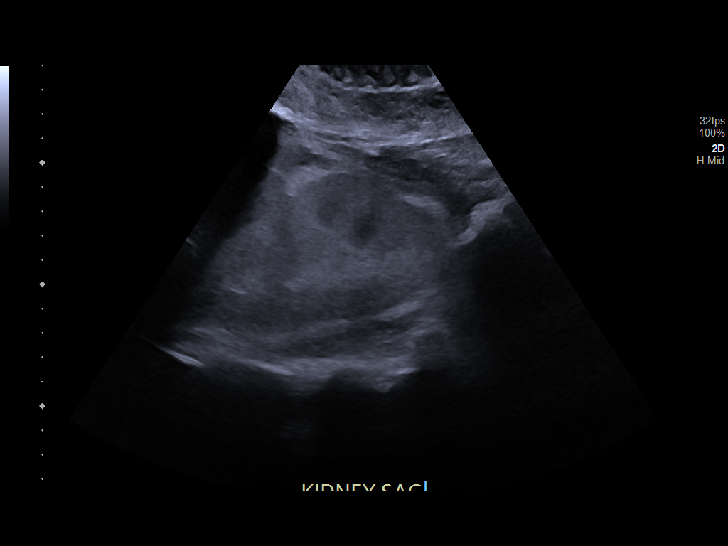
[im 12/21]
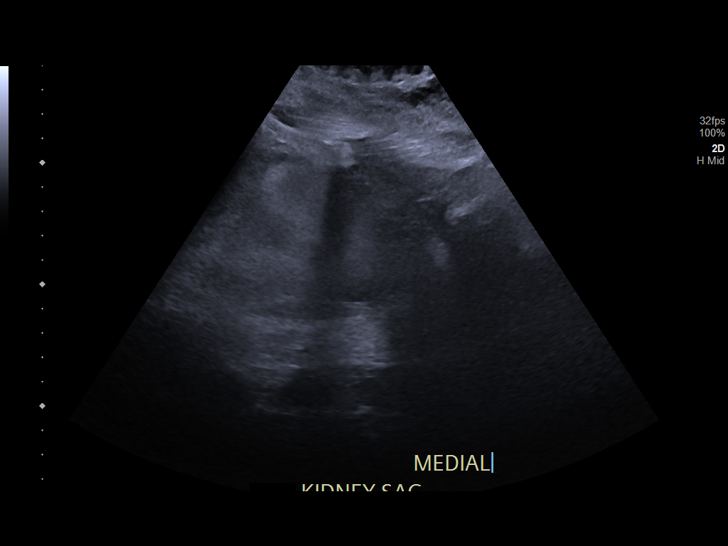
[im 13/21]
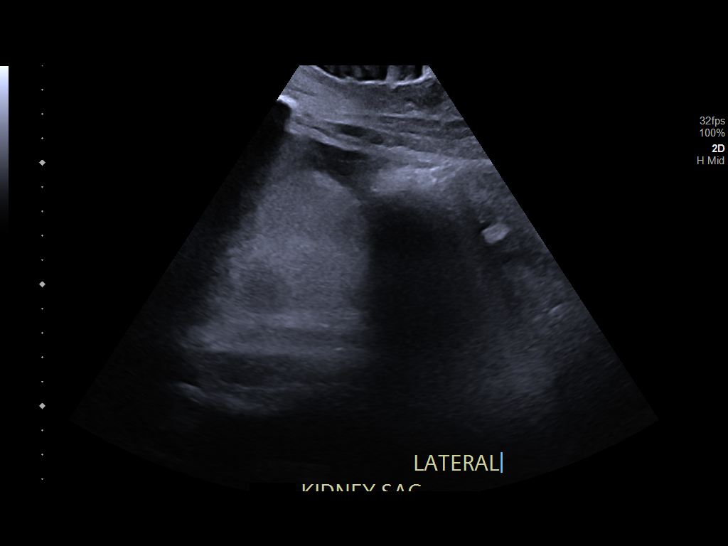
[im 15/21]
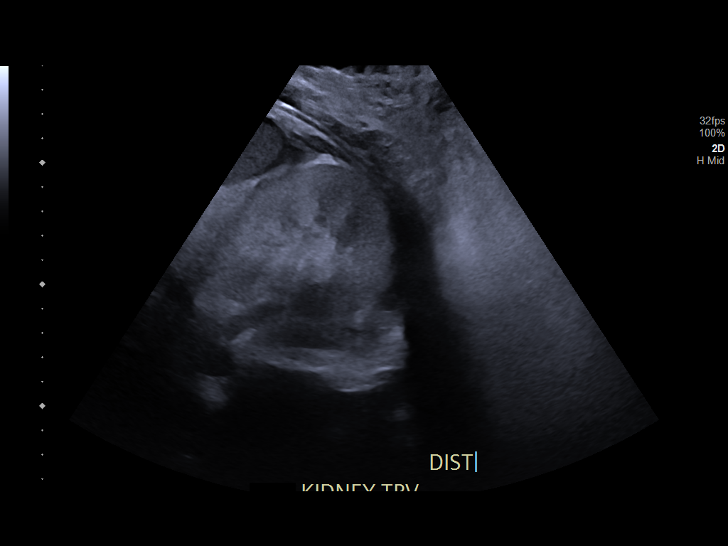
[im 16/21]
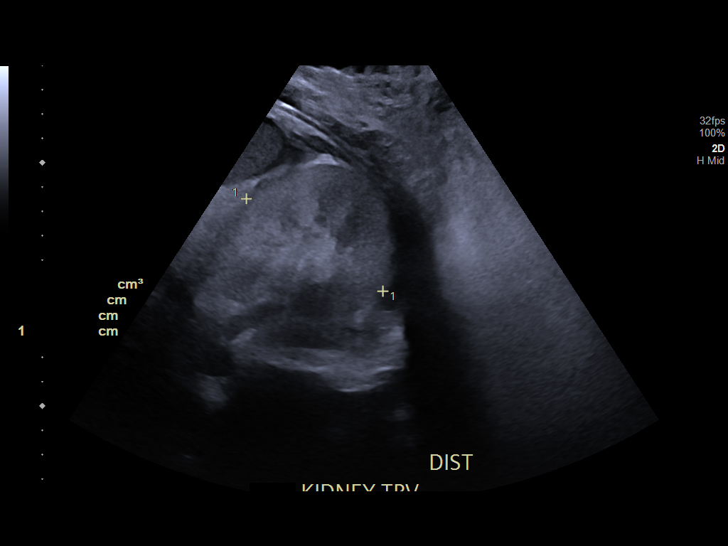
[im 18/21]
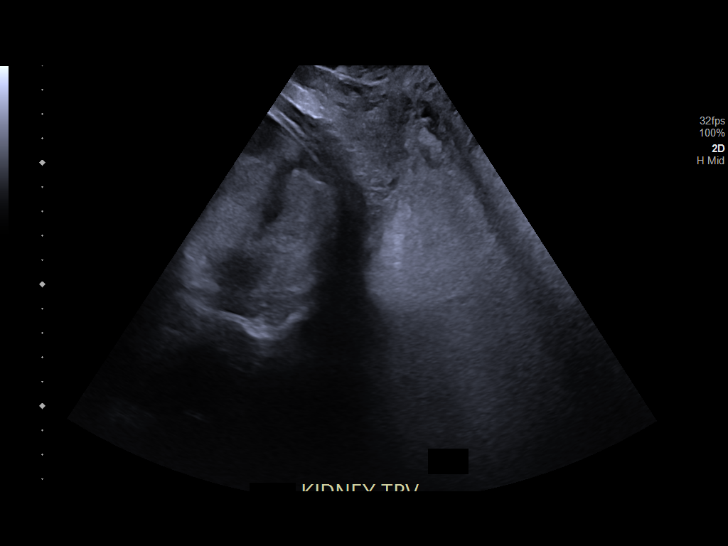
[im 19/21]
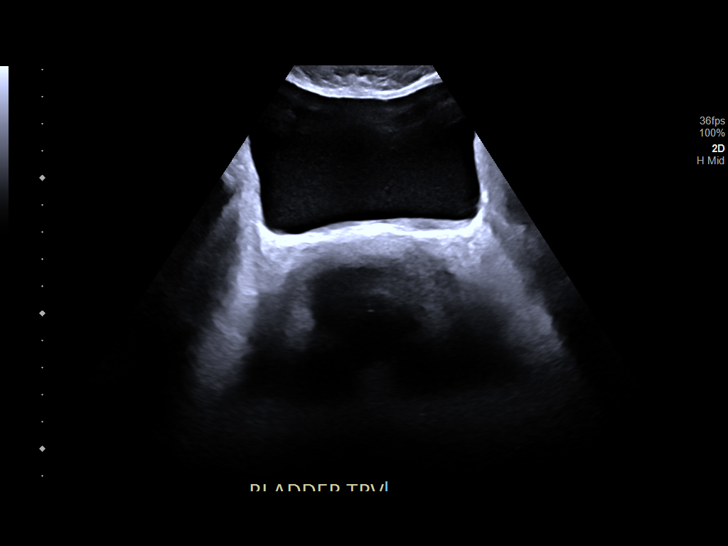
[im 21/21]
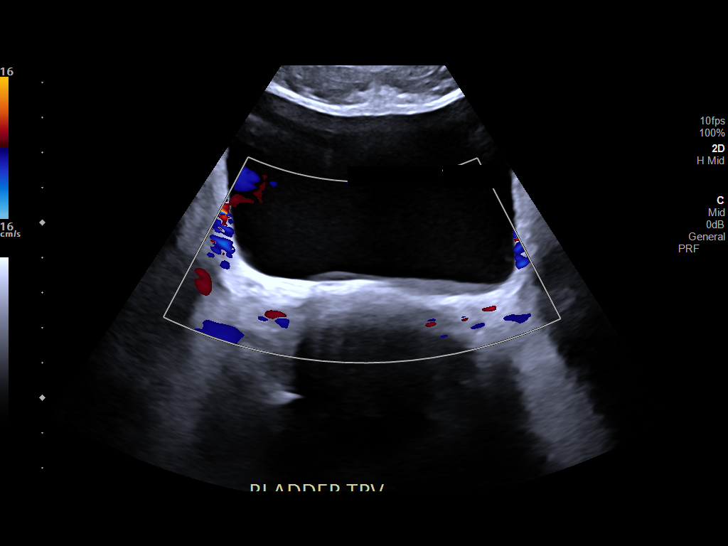

[14 of 21 positions shown; findings below may reference images not displayed]

FINDINGS: Right Kidney:

Renal measurements: 13.5 x 4.4 x 4.8 cm = volume: 148 mL. Increased
echogenicity without hydronephrosis or mass lesion evident.

Left Kidney:

Renal measurements: 12.1 x 5.9 x 6.8 cm = volume: 253 mL. Echogenic
parenchyma without mass lesion or hydronephrosis.

Bladder:

Appears normal for degree of bladder distention.

Other:

None.
IMPRESSION: Echogenic kidneys bilaterally without hydronephrosis. Imaging
features compatible with medical renal disease.

## 2022-02-15 MED ORDER — SODIUM CHLORIDE 0.9 % IV SOLN
300.0000 mg | Freq: Three times a day (TID) | INTRAVENOUS | Status: DC
  Administered 2022-02-15 – 2022-02-18 (×8): 300 mg via INTRAVENOUS
  Filled 2022-02-15 (×12): qty 15

## 2022-02-15 MED ORDER — ALBUTEROL SULFATE (2.5 MG/3ML) 0.083% IN NEBU
2.5000 mg | INHALATION_SOLUTION | Freq: Once | RESPIRATORY_TRACT | Status: AC
Start: 2022-02-15 — End: 2022-02-15
  Administered 2022-02-15: 2.5 mg via RESPIRATORY_TRACT
  Filled 2022-02-15: qty 3

## 2022-02-15 MED ORDER — ALBUMIN HUMAN 25 % IV SOLN
25.0000 g | Freq: Once | INTRAVENOUS | Status: AC
  Administered 2022-02-15: 25 g via INTRAVENOUS
  Filled 2022-02-15: qty 100

## 2022-02-15 MED ORDER — SODIUM CHLORIDE 0.9 % IV SOLN
8.0000 mg/kg | INTRAVENOUS | Status: DC
  Administered 2022-02-16: 600 mg via INTRAVENOUS
  Filled 2022-02-15 (×2): qty 12

## 2022-02-15 MED ORDER — SODIUM CHLORIDE 0.9% IV SOLUTION: Freq: Once | INTRAVENOUS | Status: AC

## 2022-02-15 MED ORDER — FUROSEMIDE 10 MG/ML IJ SOLN
80.0000 mg | Freq: Once | INTRAMUSCULAR | Status: AC
  Administered 2022-02-15: 80 mg via INTRAVENOUS
  Filled 2022-02-15: qty 8

## 2022-02-15 NOTE — Progress Notes (Signed)
PHARMACY NOTE:  ANTIMICROBIAL RENAL DOSAGE ADJUSTMENT  Current antimicrobial regimen includes a mismatch between antimicrobial dosage and estimated renal function.  As per policy approved by the Pharmacy & Therapeutics and Medical Executive Committees, the antimicrobial dosage will be adjusted accordingly.  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 Nephrology is following.  Estimated Creatinine Clearance: 24.1 mL/min (A) (by C-G formula based on SCr of 3.05 mg/dL (H)).     Antimicrobial dosage has been changed to:   Daptomycin 600 mg IV every 48 hours Ceftaroline 300 mg IV every 8 hours  Additional comments: Patients weight has been increasing, have adjusted Daptomycin dose based on current weight as well.    Thank you for allowing [place] to participate in this patients care. Signe Colt, PharmD 02/15/2022 12:21 PM  **Pharmacist phone directory can be found on [email or web address] listed under [facility]**

## 2022-02-15 NOTE — Progress Notes (Signed)
Progress Note  Patient: Carol Rivera WUJ:[id number] DOB: [birth year]  DOA: 02/06/2022  DOS: 02/15/2022    Brief hospital course: 31 y.o. F W/ history significant for childhood asthma and intravenous drug abuse presented with fever, chest pain, cough and left shoulder pain. In ED-febrile, tachycardic.  CXR and left shoulder x-ray was negative for acute findings. Sodium 128, alkaline phosphatase 163, AST 257, ALT 454, total bilirubin of 6.9 with WBCs of 17,900 and lactic acid of 4.  COVID-19 and influenza testing negative.  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  MRI of the spine without obvious abscess/phlegmon or osseous involvement.subsequently transferred from [facility] to [facility] 5/22 for TEE and CT surgery evaluation. TEE--Large tricuspid vegetation, not accessible via angiovac per CTS on 5/22, also not a candidate for valve replacement given recent IV drug use -Seen by Orthopedic Surgery for septic AC joints, now s/p L shoulder AC resection and extensive debridement on 5/22. Sent to ICU and now back to [facility].    Assessment and Plan: MRSA bacteremia with multifocal metastatic infections, tricuspid valve endocarditis with TR: CT surgery eval 5/22 not accessible via angiovac, and not a candidate for valve replacement given recent IV drug abuse and valve destruction. Blood cultures were persistently positive, though 5/25 set is NGTD.  - Daptomycin, teflaro per ID. Dosing per pharmacy.   Anemia of acute illness:  - give 1u PRBCs and monitor H/H serially. Confirmed pt's consent. Suspect anemia of acute disease primarily. Will also monitor for large volume bleeding.  - Question of drug-related hemolytic anemia raised by the prudent nephrologist. Fortunately, DAT  reassuringly negative. Repeat H/H post transfusion shows more than adequate response. Will continue monitoring serially.   Acute renal failure: With nephrotic range proteinuria, spot UPr:Cr is 6, serum albumin undetectable. Third spacing evident on exam. Still making (some, incompletely documented) urine. Renal U/S with increased echogenicity, no hydro. - Albumin, lasix administered. Appreciate nephrology assistance. - Maintain strict I/O - DC'ed toradol - Complement pending, though has active findings on UA.  Wheezing:  - Continue albuterol prn, give neb x1 now and monitor.   T11 osteomyelitis: demonstrating T2-hyperintensity, suspect new osteomyelitis. Dr. Maurice Small, neurosurgery evaluated the patient on 5/26, recommending continuation of antibiotics without acute neurosurgical intervention. +clonus on L > R so could consider repeat MR brain given her risk of septic cerebral emboli.  - Continue oxycodone prn moderate and hydromorphone prn severe pain. Will likely consolidate into hydromorphone po only in the coming days given renal failure and ability to take po/risk for polypharmacy.  Left AC septic arthritis: s/p I&D 5/22.  - Wound care per orthopedics.  - Per Dr. August Saucer, plan is for open distal clavicle excision and debridement of AC joint.  IVDU:  - Cessation counseling provided.   Acute metabolic encephalopathy: Improved from admission.  - Continue supportive seroquel and clonazepam.   Obesity: Estimated body mass index is 32.21 kg/m as calculated from the following:   Height as of this encounter: 5' (1.524 m).   Weight as of this encounter: 74.8 kg.  Subjective: Has pain in her chest and back which is stable, also pain with movement of LUE at the shoulder. Feeling puffy all over. Getting up with assistance to bathroom but otherwise in bed, not in halls. Eating ok. Says she's been coughing up blood intermittently. None today. No other  bleeding.   Objective: Vitals:   02/14/22  2308 02/15/22 0208 02/15/22 0314 02/15/22 0808  BP: 137/90  136/89 (!) 135/100  Pulse: (!) 103  100 97  Resp: 20  20 18   Temp: 97.8 F (36.6 C)  97.8 F (36.6 C) 98 F (36.7 C)  TempSrc: Oral  Axillary Oral  SpO2: 94%  94% 95%  Weight:  74.8 kg    Height:       Gen: Ill-appearing 31 y.o. female in no distress Pulm: Nonlabored tachypnea with I/e wheezing. CV: Regular rate and rhythm. III/VI blowing holosystolic murmur at LSB. No rub, or gallop. No JVD, 2+ diffuse edema. GI: Abdomen soft, non-tender, non-distended, with normoactive bowel sounds.  Ext: Warm, dry, without deformities Skin: No acute rashes, lesions or ulcers on visualized skin. Left AC dressing c/d/i Neuro: Alert and oriented. Diffusely weak limiting exam. Sensory appears intact. Has clonus at ankles L > R. Psych: Judgement and insight appear marginal.    Data Personally reviewed: CBC: Recent Labs  Lab 02/11/22 0136 02/12/22 0224 02/13/22 0119 02/14/22 0551 02/15/22 0446  WBC 32.2* 21.0* 24.4* 26.8* 20.8*  HGB 9.5* 8.3* 8.2* 7.3* 6.7*  HCT 27.5* 25.2* 24.7* 22.5* 20.1*  MCV 72.2* 73.7* 74.2* 73.3* 72.0*  PLT 159 212 287 358 389   Basic Metabolic Panel: Recent Labs  Lab 02/10/22 0653 02/11/22 0618 02/12/22 0224 02/13/22 0119 02/14/22 0551 02/15/22 0446  NA 134* 137 136 136 136 137  K 3.5 3.4* 3.6 3.7 4.4 5.1  CL 104 110 109 110 108 111  CO2 20* 19* 22 20* 21* 19*  GLUCOSE 55* 86 120* 108* 101* 102*  BUN 12 18 24* 24* 37* 53*  CREATININE 0.62 0.70 0.98 1.12* 2.06* 3.05*  CALCIUM 7.2* 7.1* 6.9* 6.9* 7.1* 7.2*  MG 2.0 2.1 2.1 2.1 2.0 2.1  PHOS 4.1  --   --   --   --  7.1*   GFR: Estimated Creatinine Clearance: 24.1 mL/min (A) (by C-G formula based on SCr of 3.05 mg/dL (H)). Liver Function Tests: Recent Labs  Lab 02/10/22 0653 02/11/22 0618 02/12/22 0224 02/13/22 0119 02/14/22 0551 02/15/22 0446  AST 45* 43* 34 27 23  --   ALT 69* 46* 36 28 22  --   ALKPHOS 186* 105 107 102 101  --    BILITOT 6.3* 4.8* 2.0* 1.4* 1.4*  --   PROT 5.6* 5.2* 5.1* 5.1* 5.3*  --   ALBUMIN <1.5* 1.6* <1.5* <1.5* <1.5* <1.5*   No results for input(s): LIPASE, AMYLASE in the last 168 hours. No results for input(s): AMMONIA in the last 168 hours. Coagulation Profile: No results for input(s): INR, PROTIME in the last 168 hours. Cardiac Enzymes: Recent Labs  Lab 02/09/22 1028  CKTOTAL 14*   BNP (last 3 results) No results for input(s): PROBNP in the last 8760 hours. HbA1C: No results for input(s): HGBA1C in the last 72 hours. CBG: Recent Labs  Lab 02/14/22 0819 02/14/22 1216 02/14/22 1627 02/14/22 2111 02/15/22 0808  GLUCAP 163* 106* 100* 110* 97   Lipid Profile: No results for input(s): CHOL, HDL, LDLCALC, TRIG, CHOLHDL, LDLDIRECT in the last 72 hours. Thyroid Function Tests: No results for input(s): TSH, T4TOTAL, FREET4, T3FREE, THYROIDAB in the last 72 hours. Anemia Panel: No results for input(s): VITAMINB12, FOLATE, FERRITIN, TIBC, IRON, RETICCTPCT in the last 72 hours. Urine analysis:    Component Value Date/Time   COLORURINE AMBER (A) 02/14/2022 1751   APPEARANCEUR CLOUDY (A) 02/14/2022 1751  LABSPEC 1.014 02/14/2022 1751   PHURINE 5.0 02/14/2022 1751   GLUCOSEU NEGATIVE 02/14/2022 1751   HGBUR LARGE (A) 02/14/2022 1751   BILIRUBINUR NEGATIVE 02/14/2022 1751   KETONESUR NEGATIVE 02/14/2022 1751   PROTEINUR 100 (A) 02/14/2022 1751   UROBILINOGEN 0.2 02/24/2012 2301   NITRITE NEGATIVE 02/14/2022 1751   LEUKOCYTESUR SMALL (A) 02/14/2022 1751   Recent Results (from the past 240 hour(s))  Blood culture (routine x 2)     Status: Abnormal   Collection Time: 02/06/22  8:15 PM   Specimen: BLOOD  Result Value Ref Range Status   Specimen Description   Final    BLOOD RIGHT ANTECUBITAL Performed at [facility], [address], [place], [place] [address]    Special Requests   Final    BOTTLES DRAWN AEROBIC ONLY Blood Culture results may not be optimal  due to an inadequate volume of blood received in culture bottles Performed at [facility], [address], [place], [place] [address]    Culture  Setup Time   Final    GRAM POSITIVE COCCI IN CLUSTERS AEROBIC BOTTLE ONLY    Culture (A)  Final    STAPHYLOCOCCUS AUREUS SUSCEPTIBILITIES PERFORMED ON PREVIOUS CULTURE WITHIN THE LAST 5 DAYS. Performed at [facility], [address], [place], [place] [address]    Report Status 02/09/2022 FINAL  Final  Blood culture (routine x 2)     Status: Abnormal   Collection Time: 02/06/22  8:20 PM   Specimen: BLOOD  Result Value Ref Range Status   Specimen Description   Final    BLOOD BLOOD RIGHT HAND Performed at [facility], [address], [place], [place] [address]    Special Requests   Final    BOTTLES DRAWN AEROBIC AND ANAEROBIC Blood Culture results may not be optimal due to an excessive volume of blood received in culture bottles Performed at [facility], [address] [place], [place] [address]    Culture  Setup Time   Final    GRAM POSITIVE COCCI IN CLUSTERS IN BOTH AEROBIC AND ANAEROBIC BOTTLES CRITICAL RESULT CALLED TO, READ BACK BY AND VERIFIED WITH: Oneta Rack [id number] AT 1205 BY CM Performed at [facility], [address], [place], [place] [address]    Culture METHICILLIN RESISTANT STAPHYLOCOCCUS AUREUS (A)  Final   Report Status 02/09/2022 FINAL  Final   Organism ID, Bacteria METHICILLIN RESISTANT STAPHYLOCOCCUS AUREUS  Final      Susceptibility   Methicillin resistant staphylococcus aureus - MIC*    CIPROFLOXACIN >=8 RESISTANT Resistant     ERYTHROMYCIN >=8 RESISTANT Resistant     GENTAMICIN <=0.5 SENSITIVE Sensitive     OXACILLIN >=4 RESISTANT Resistant     TETRACYCLINE <=1 SENSITIVE Sensitive     VANCOMYCIN 1 SENSITIVE Sensitive     TRIMETH/SULFA 160 RESISTANT Resistant     CLINDAMYCIN <=0.25 SENSITIVE Sensitive     RIFAMPIN <=0.5 SENSITIVE  Sensitive     Inducible Clindamycin NEGATIVE Sensitive     * METHICILLIN RESISTANT STAPHYLOCOCCUS AUREUS  Blood Culture ID Panel (Reflexed)     Status: Abnormal   Collection Time: 02/06/22  8:20 PM  Result Value Ref Range Status   Enterococcus faecalis NOT DETECTED NOT DETECTED Final   Enterococcus Faecium NOT DETECTED NOT DETECTED Final   Listeria monocytogenes NOT DETECTED NOT DETECTED Final   Staphylococcus species DETECTED (A) NOT DETECTED Final    Comment: CRITICAL RESULT CALLED TO,  READ BACK BY AND VERIFIED WITH: PHARMD M BELL [id number] AT 1205 BY CM    Staphylococcus aureus (BCID) DETECTED (A) NOT DETECTED Final    Comment: Methicillin (oxacillin)-resistant Staphylococcus aureus (MRSA). MRSA is predictably resistant to beta-lactam antibiotics (except ceftaroline). Preferred therapy is vancomycin unless clinically contraindicated. Patient requires contact precautions if  hospitalized. CRITICAL RESULT CALLED TO, READ BACK BY AND VERIFIED WITH: PHARMD M BELL [id number] AT 1205 BY CM    Staphylococcus epidermidis NOT DETECTED NOT DETECTED Final   Staphylococcus lugdunensis NOT DETECTED NOT DETECTED Final   Streptococcus species NOT DETECTED NOT DETECTED Final   Streptococcus agalactiae NOT DETECTED NOT DETECTED Final   Streptococcus pneumoniae NOT DETECTED NOT DETECTED Final   Streptococcus pyogenes NOT DETECTED NOT DETECTED Final   A.calcoaceticus-baumannii NOT DETECTED NOT DETECTED Final   Bacteroides fragilis NOT DETECTED NOT DETECTED Final   Enterobacterales NOT DETECTED NOT DETECTED Final   Enterobacter cloacae complex NOT DETECTED NOT DETECTED Final   Escherichia coli NOT DETECTED NOT DETECTED Final   Klebsiella aerogenes NOT DETECTED NOT DETECTED Final   Klebsiella oxytoca NOT DETECTED NOT DETECTED Final   Klebsiella pneumoniae NOT DETECTED NOT DETECTED Final   Proteus species NOT DETECTED NOT DETECTED Final   Salmonella species NOT DETECTED NOT DETECTED Final   Serratia  marcescens NOT DETECTED NOT DETECTED Final   Haemophilus influenzae NOT DETECTED NOT DETECTED Final   Neisseria meningitidis NOT DETECTED NOT DETECTED Final   Pseudomonas aeruginosa NOT DETECTED NOT DETECTED Final   Stenotrophomonas maltophilia NOT DETECTED NOT DETECTED Final   Candida albicans NOT DETECTED NOT DETECTED Final   Candida auris NOT DETECTED NOT DETECTED Final   Candida glabrata NOT DETECTED NOT DETECTED Final   Candida krusei NOT DETECTED NOT DETECTED Final   Candida parapsilosis NOT DETECTED NOT DETECTED Final   Candida tropicalis NOT DETECTED NOT DETECTED Final   Cryptococcus neoformans/gattii NOT DETECTED NOT DETECTED Final   Meth resistant mecA/C and MREJ DETECTED (A) NOT DETECTED Final    Comment: CRITICAL RESULT CALLED TO, READ BACK BY AND VERIFIED WITH: Oneta Rack [id number] AT 1205 BY CM Performed at [facility], [address] [place], [place] [address]   Resp Panel by RT-PCR (Flu A&B, Covid) Peripheral     Status: None   Collection Time: 02/06/22  8:38 PM   Specimen: Peripheral; Nasopharyngeal(NP) swabs in vial transport medium  Result Value Ref Range Status   SARS Coronavirus 2 by RT PCR NEGATIVE NEGATIVE Final    Comment: (NOTE) SARS-CoV-2 target nucleic acids are NOT DETECTED.  The SARS-CoV-2 RNA is generally detectable in upper respiratory specimens during the acute phase of infection. The lowest concentration of SARS-CoV-2 viral copies this assay can detect is 138 copies/mL. A negative result does not preclude SARS-Cov-2 infection and should not be used as the sole basis for treatment or other patient management decisions. A negative result may occur with  improper specimen collection/handling, submission of specimen other than nasopharyngeal swab, presence of viral mutation(s) within the areas targeted by this assay, and inadequate number of viral copies(<138 copies/mL). A negative result must be combined with clinical observations, patient  history, and epidemiological information. The expected result is Negative.  Fact Sheet for Patients:  BloggerCourse.com  Fact Sheet for Healthcare Providers:  SeriousBroker.it  This test is no t yet approved or cleared by the [place] FDA and  has been authorized for detection and/or diagnosis of SARS-CoV-2 by FDA under an Emergency Use Authorization (EUA). This EUA will  remain  in effect (meaning this test can be used) for the duration of the COVID-19 declaration under Section 564(b)(1) of the Act, 21 U.S.C.section 360bbb-3(b)(1), unless the authorization is terminated  or revoked sooner.       Influenza A by PCR NEGATIVE NEGATIVE Final   Influenza B by PCR NEGATIVE NEGATIVE Final    Comment: (NOTE) The Xpert Xpress SARS-CoV-2/FLU/RSV plus assay is intended as an aid in the diagnosis of influenza from Nasopharyngeal swab specimens and should not be used as a sole basis for treatment. Nasal washings and aspirates are unacceptable for Xpert Xpress SARS-CoV-2/FLU/RSV testing.  Fact Sheet for Patients: BloggerCourse.com  Fact Sheet for Healthcare Providers: SeriousBroker.it  This test is not yet approved or cleared by the [place] FDA and has been authorized for detection and/or diagnosis of SARS-CoV-2 by FDA under an Emergency Use Authorization (EUA). This EUA will remain in effect (meaning this test can be used) for the duration of the COVID-19 declaration under Section 564(b)(1) of the Act, 21 U.S.C. section 360bbb-3(b)(1), unless the authorization is terminated or revoked.  Performed at [facility], [address] [place], [place] [address]   Urine Culture     Status: Abnormal   Collection Time: 02/07/22 12:23 AM   Specimen: In/Out Cath Urine  Result Value Ref Range Status   Specimen Description   Final    IN/OUT CATH URINE Performed at  [facility], [address] [place], [place] [address]    Special Requests   Final    NONE Performed at [facility], [address] [place], [place] [address]    Culture (A)  Final    >=100,000 COLONIES/mL METHICILLIN RESISTANT STAPHYLOCOCCUS AUREUS   Report Status 02/09/2022 FINAL  Final   Organism ID, Bacteria METHICILLIN RESISTANT STAPHYLOCOCCUS AUREUS (A)  Final      Susceptibility   Methicillin resistant staphylococcus aureus - MIC*    CIPROFLOXACIN >=8 RESISTANT Resistant     GENTAMICIN <=0.5 SENSITIVE Sensitive     NITROFURANTOIN <=16 SENSITIVE Sensitive     OXACILLIN >=4 RESISTANT Resistant     TETRACYCLINE <=1 SENSITIVE Sensitive     VANCOMYCIN 1 SENSITIVE Sensitive     TRIMETH/SULFA >=320 RESISTANT Resistant     CLINDAMYCIN <=0.25 SENSITIVE Sensitive     RIFAMPIN <=0.5 SENSITIVE Sensitive     Inducible Clindamycin NEGATIVE Sensitive     * >=100,000 COLONIES/mL METHICILLIN RESISTANT STAPHYLOCOCCUS AUREUS  MRSA Next Gen by PCR, Nasal     Status: Abnormal   Collection Time: 02/07/22 12:43 AM   Specimen: Nasal Mucosa; Nasal Swab  Result Value Ref Range Status   MRSA by PCR Next Gen DETECTED (A) NOT DETECTED Final    Comment: CRITICAL RESULT CALLED TO, READ BACK BY AND VERIFIED WITH: CALLED TO ZAHOUREK,A AT 5093 ON 02/07/22 BY VAZQUEZ,J (NOTE) The GeneXpert MRSA Assay (FDA approved for NASAL specimens only), is one component of a comprehensive MRSA colonization surveillance program. It is not intended to diagnose MRSA infection nor to guide or monitor treatment for MRSA infections. Test performance is not FDA approved in patients less than 72 years old. Performed at [facility], [address], [place], [place] [address]   Culture, blood (single) w Reflex to ID Panel     Status: Abnormal   Collection Time: 02/07/22  6:58 AM   Specimen: BLOOD  Result Value Ref Range Status   Specimen Description   Final     BLOOD BLOOD LEFT  WRIST Performed at [facility], [address], [place], [place] [address]    Special Requests   Final    IN PEDIATRIC BOTTLE Blood Culture adequate volume Performed at [facility], [address] [place], [place] [address]    Culture  Setup Time   Final    GRAM POSITIVE COCCI IN CLUSTERS IN PEDIATRIC BOTTLE CRITICAL VALUE NOTED.  VALUE IS CONSISTENT WITH PREVIOUSLY REPORTED AND CALLED VALUE.    Culture (A)  Final    STAPHYLOCOCCUS AUREUS SUSCEPTIBILITIES PERFORMED ON PREVIOUS CULTURE WITHIN THE LAST 5 DAYS. Performed at [facility], [address], [place], [place] [address]    Report Status 02/09/2022 FINAL  Final  Culture, blood (Routine X 2) w Reflex to ID Panel     Status: Abnormal   Collection Time: 02/08/22  2:58 AM   Specimen: BLOOD  Result Value Ref Range Status   Specimen Description   Final    BLOOD BLOOD LEFT HAND Performed at [facility], [address], [place], [place] [address]    Special Requests   Final    IN PEDIATRIC BOTTLE Blood Culture adequate volume Performed at [facility], [address] [place], [place] [address]    Culture  Setup Time   Final    GRAM POSITIVE COCCI IN CLUSTERS AEROBIC BOTTLE ONLY CRITICAL VALUE NOTED.  VALUE IS CONSISTENT WITH PREVIOUSLY REPORTED AND CALLED VALUE.    Culture (A)  Final    STAPHYLOCOCCUS AUREUS SUSCEPTIBILITIES PERFORMED ON PREVIOUS CULTURE WITHIN THE LAST 5 DAYS. Performed at [facility], [address] [place], [place] [address]    Report Status 02/10/2022 FINAL  Final  Culture, blood (Routine X 2) w Reflex to ID Panel     Status: Abnormal (Preliminary result)   Collection Time: 02/08/22  2:59 AM   Specimen: BLOOD  Result Value Ref Range Status   Specimen Description   Final    BLOOD RIGHT ANTECUBITAL Performed at [facility], [address], [place], [place] [address]     Special Requests   Final    IN PEDIATRIC BOTTLE Blood Culture adequate volume Performed at [facility], [address] [place], [place] [address]    Culture  Setup Time   Final    GRAM POSITIVE COCCI IN CLUSTERS AEROBIC BOTTLE ONLY CRITICAL VALUE NOTED.  VALUE IS CONSISTENT WITH PREVIOUSLY REPORTED AND CALLED VALUE.    Culture (A)  Final    STAPHYLOCOCCUS AUREUS Sent to [facility] for further susceptibility testing. Performed at [facility], [address], [place], [place] [address]    Report Status PENDING  Incomplete  Min Inhibitory Conc (2 Drugs)     Status: None   Collection Time: 02/08/22  2:59 AM  Result Value Ref Range Status   Min Inhibitory Conc (2 Drugs) Final report  Corrected    Comment: (NOTE) Performed At: [facility] [address] [place], [place] [id number] Jolene Schimke MD ZD:[id number] CORRECTED ON 05/27 AT 0936: PREVIOUSLY REPORTED AS Preliminary report    Source (MIC2) BLOOD  Final    Comment: Performed at [facility], [address] [place], [place] [address]  MIC Results (2 Drugs)     Status: None   Collection Time: 02/08/22  2:59 AM  Result Value Ref Range Status   RESULT 5 MIC (RESULT 1)  Base Name: Staphylococcus aureus  Final    Comment: (NOTE) Identification performed by account, not confirmed by this laboratory. CEFTAROLINE     <=1 UG/ML = SUSCEPTIBLE DAPTOMYCIN      <=1 UG/ML = SUSCEPTIBLE Performed At: [facility] [address] [place], [place] [id number] Jolene Schimke MD WU:[id number]   Culture, blood (Routine X 2) w Reflex to ID Panel     Status: Abnormal   Collection Time: 02/10/22  6:43 AM   Specimen: BLOOD  Result Value Ref Range Status   Specimen Description BLOOD LEFT ANTECUBITAL  Final   Special Requests   Final    BOTTLES DRAWN AEROBIC AND ANAEROBIC Blood Culture adequate volume   Culture  Setup Time   Final    GRAM POSITIVE COCCI IN CLUSTERS IN  BOTH AEROBIC AND ANAEROBIC BOTTLES CRITICAL RESULT CALLED TO, READ BACK BY AND VERIFIED WITH: Tandy Gaw 1403 [id number] FCP Performed at [facility], [address], [place], [place] [address]    Culture METHICILLIN RESISTANT STAPHYLOCOCCUS AUREUS (A)  Final   Report Status 02/14/2022 FINAL  Final   Organism ID, Bacteria METHICILLIN RESISTANT STAPHYLOCOCCUS AUREUS  Final      Susceptibility   Methicillin resistant staphylococcus aureus - MIC*    CIPROFLOXACIN >=8 RESISTANT Resistant     ERYTHROMYCIN >=8 RESISTANT Resistant     GENTAMICIN <=0.5 SENSITIVE Sensitive     OXACILLIN >=4 RESISTANT Resistant     TETRACYCLINE <=1 SENSITIVE Sensitive     VANCOMYCIN 1 SENSITIVE Sensitive     TRIMETH/SULFA >=320 RESISTANT Resistant     CLINDAMYCIN <=0.25 SENSITIVE Sensitive     RIFAMPIN <=0.5 SENSITIVE Sensitive     Inducible Clindamycin NEGATIVE Sensitive     * METHICILLIN RESISTANT STAPHYLOCOCCUS AUREUS  Blood Culture ID Panel (Reflexed)     Status: Abnormal   Collection Time: 02/10/22  6:43 AM  Result Value Ref Range Status   Enterococcus faecalis NOT DETECTED NOT DETECTED Final   Enterococcus Faecium NOT DETECTED NOT DETECTED Final   Listeria monocytogenes NOT DETECTED NOT DETECTED Final   Staphylococcus species DETECTED (A) NOT DETECTED Final    Comment: CRITICAL RESULT CALLED TO, READ BACK BY AND VERIFIED WITH: PHARMD RACHEL S 1403 [id number] FCP    Staphylococcus aureus (BCID) DETECTED (A) NOT DETECTED Final    Comment: Methicillin (oxacillin)-resistant Staphylococcus aureus (MRSA). MRSA is predictably resistant to beta-lactam antibiotics (except ceftaroline). Preferred therapy is vancomycin unless clinically contraindicated. Patient requires contact precautions if  hospitalized. CRITICAL RESULT CALLED TO, READ BACK BY AND VERIFIED WITH: PHARMD RACHEL S 1403 [id number] FCP    Staphylococcus epidermidis NOT DETECTED NOT DETECTED Final   Staphylococcus lugdunensis NOT DETECTED NOT  DETECTED Final   Streptococcus species NOT DETECTED NOT DETECTED Final   Streptococcus agalactiae NOT DETECTED NOT DETECTED Final   Streptococcus pneumoniae NOT DETECTED NOT DETECTED Final   Streptococcus pyogenes NOT DETECTED NOT DETECTED Final   A.calcoaceticus-baumannii NOT DETECTED NOT DETECTED Final   Bacteroides fragilis NOT DETECTED NOT DETECTED Final   Enterobacterales NOT DETECTED NOT DETECTED Final   Enterobacter cloacae complex NOT DETECTED NOT DETECTED Final   Escherichia coli NOT DETECTED NOT DETECTED Final   Klebsiella aerogenes NOT DETECTED NOT DETECTED Final   Klebsiella oxytoca NOT DETECTED NOT DETECTED Final   Klebsiella pneumoniae NOT DETECTED NOT DETECTED Final   Proteus species NOT DETECTED NOT DETECTED Final   Salmonella species NOT DETECTED NOT DETECTED Final   Serratia marcescens NOT DETECTED NOT DETECTED Final  Haemophilus influenzae NOT DETECTED NOT DETECTED Final   Neisseria meningitidis NOT DETECTED NOT DETECTED Final   Pseudomonas aeruginosa NOT DETECTED NOT DETECTED Final   Stenotrophomonas maltophilia NOT DETECTED NOT DETECTED Final   Candida albicans NOT DETECTED NOT DETECTED Final   Candida auris NOT DETECTED NOT DETECTED Final   Candida glabrata NOT DETECTED NOT DETECTED Final   Candida krusei NOT DETECTED NOT DETECTED Final   Candida parapsilosis NOT DETECTED NOT DETECTED Final   Candida tropicalis NOT DETECTED NOT DETECTED Final   Cryptococcus neoformans/gattii NOT DETECTED NOT DETECTED Final   Meth resistant mecA/C and MREJ DETECTED (A) NOT DETECTED Final    Comment: CRITICAL RESULT CALLED TO, READ BACK BY AND VERIFIED WITH: Gordan PaymentHARM RACHEL S 1403 [id number] FCP Performed at [facility], [address] [place], [place] [address]   Culture, blood (Routine X 2) w Reflex to ID Panel     Status: Abnormal   Collection Time: 02/10/22  6:53 AM   Specimen: BLOOD  Result Value Ref Range Status   Specimen Description BLOOD LEFT ANTECUBITAL  Final    Special Requests   Final    BOTTLES DRAWN AEROBIC AND ANAEROBIC Blood Culture adequate volume   Culture  Setup Time   Final    GRAM POSITIVE COCCI IN CLUSTERS IN BOTH AEROBIC AND ANAEROBIC BOTTLES CRITICAL RESULT CALLED TO, READ BACK BY AND VERIFIED WITH: PHARMD EDEN BREWINGTON ON 02/13/22 @ 1538 BY DRT    Culture (A)  Final    STAPHYLOCOCCUS AUREUS SUSCEPTIBILITIES PERFORMED ON PREVIOUS CULTURE WITHIN THE LAST 5 DAYS. Performed at [facility], [address] [place], [place] [address]    Report Status 02/15/2022 FINAL  Final  Surgical PCR screen     Status: Abnormal   Collection Time: 02/10/22  9:22 AM   Specimen: Nasal Mucosa; Nasal Swab  Result Value Ref Range Status   MRSA, PCR POSITIVE (A) NEGATIVE Final    Comment: RESULT CALLED TO, READ BACK BY AND VERIFIED WITH: OLVIA MARTIN @1503   DL    Staphylococcus aureus POSITIVE (A) NEGATIVE Final    Comment: (NOTE) The Xpert SA Assay (FDA approved for NASAL specimens in patients 422 years of age and older), is one component of a comprehensive surveillance program. It is not intended to diagnose infection nor to guide or monitor treatment. Performed at [facility], [address] [place], [place] [address]   Aerobic/Anaerobic Culture w Gram Stain (surgical/deep wound)     Status: None (Preliminary result)   Collection Time: 02/10/22 11:31 AM   Specimen: PATH Other; Tissue  Result Value Ref Range Status   Specimen Description WOUND  Final   Special Requests LEFT AC JOINT  Final   Gram Stain   Final    RARE WBC PRESENT,BOTH PMN AND MONONUCLEAR RARE GRAM POSITIVE COCCI IN CLUSTERS Performed at [facility], [address] [place], [place] [address]    Culture FEW METHICILLIN RESISTANT STAPHYLOCOCCUS AUREUS  Final   Report Status PENDING  Incomplete   Organism ID, Bacteria METHICILLIN RESISTANT STAPHYLOCOCCUS AUREUS  Final      Susceptibility   Methicillin resistant staphylococcus aureus - MIC*     CIPROFLOXACIN >=8 RESISTANT Resistant     ERYTHROMYCIN >=8 RESISTANT Resistant     GENTAMICIN <=0.5 SENSITIVE Sensitive     OXACILLIN >=4 RESISTANT Resistant     TETRACYCLINE <=1 SENSITIVE Sensitive     VANCOMYCIN <=0.5 SENSITIVE Sensitive     TRIMETH/SULFA >=320 RESISTANT  Resistant     CLINDAMYCIN <=0.25 SENSITIVE Sensitive     RIFAMPIN <=0.5 SENSITIVE Sensitive     Inducible Clindamycin NEGATIVE Sensitive     * FEW METHICILLIN RESISTANT STAPHYLOCOCCUS AUREUS  Culture, blood (Routine X 2) w Reflex to ID Panel     Status: None (Preliminary result)   Collection Time: 02/13/22  1:19 AM   Specimen: BLOOD LEFT HAND  Result Value Ref Range Status   Specimen Description BLOOD LEFT HAND  Final   Special Requests AEROBIC BOTTLE ONLY Blood Culture adequate volume  Final   Culture   Final    NO GROWTH 2 DAYS Performed at [facility], [address] [place], [place] [address]    Report Status PENDING  Incomplete  Culture, blood (Routine X 2) w Reflex to ID Panel     Status: None (Preliminary result)   Collection Time: 02/13/22  1:38 AM   Specimen: BLOOD RIGHT HAND  Result Value Ref Range Status   Specimen Description BLOOD RIGHT HAND  Final   Special Requests   Final    BOTTLES DRAWN AEROBIC AND ANAEROBIC Blood Culture adequate volume   Culture   Final    NO GROWTH 2 DAYS Performed at [facility], [address], [place], [place] [address]    Report Status PENDING  Incomplete     MR CERVICAL SPINE W WO CONTRAST  Result Date: 02/13/2022 CLINICAL DATA:  Mid back pain, worsening. Persistent bacteremia, evaluate for spinal infection EXAM: MRI CERVICAL AND THORACIC SPINE WITHOUT AND WITH CONTRAST TECHNIQUE: Multiplanar and multiecho pulse sequences of the cervical spine, to include the craniocervical junction and cervicothoracic junction, and the thoracic spine, were obtained without and with intravenous contrast. CONTRAST:  7mL GADAVIST GADOBUTROL 1 MMOL/ML IV SOLN COMPARISON:   02/07/2022 FINDINGS: MRI CERVICAL SPINE FINDINGS Very motion degraded Alignment: Physiologic. Vertebrae: No fracture, evidence of discitis, or bone lesion. Cord: Normal signal and morphology.  No evidence of collection Posterior Fossa, vertebral arteries, paraspinal tissues: Subcutaneous edema which is generalized. Disc levels: No evidence of herniation or impingement. MRI THORACIC SPINE FINDINGS Alignment:  Normal Vertebrae: New anterior inferior corner edema at T11. No disc T2 hyperintensity or endplate destruction. Cord:  Normal signal and morphology.  No evidence of cord collection Paraspinal and other soft tissues: Extensive bilateral pneumonia with cavitary features and complicated bilateral pleural effusion. Cavitation and pleural fluid is progressed from comparison CT 02/07/2022 Disc levels: No herniation, impingement, or facet spurring IMPRESSION: 1. New area of marrow edema at the anterior inferior corner of T11, likely early osteomyelitis/apophysitis. 2. No evidence of cervical spine infection. 3. Negative for cervicothoracic canal collection. 4. Partially covered bilateral septic pneumonia and complex pleural effusions, progressed from chest CT 02/07/2022. Electronically Signed   By: Tiburcio Pea M.D.   On: 02/13/2022 13:02   MR THORACIC SPINE W WO CONTRAST  Result Date: 02/13/2022 CLINICAL DATA:  Mid back pain, worsening. Persistent bacteremia, evaluate for spinal infection EXAM: MRI CERVICAL AND THORACIC SPINE WITHOUT AND WITH CONTRAST TECHNIQUE: Multiplanar and multiecho pulse sequences of the cervical spine, to include the craniocervical junction and cervicothoracic junction, and the thoracic spine, were obtained without and with intravenous contrast. CONTRAST:  7mL GADAVIST GADOBUTROL 1 MMOL/ML IV SOLN COMPARISON:  02/07/2022 FINDINGS: MRI CERVICAL SPINE FINDINGS Very motion degraded Alignment: Physiologic. Vertebrae: No fracture, evidence of discitis, or bone lesion. Cord: Normal signal and  morphology.  No evidence of collection Posterior Fossa, vertebral arteries, paraspinal tissues: Subcutaneous edema which  is generalized. Disc levels: No evidence of herniation or impingement. MRI THORACIC SPINE FINDINGS Alignment:  Normal Vertebrae: New anterior inferior corner edema at T11. No disc T2 hyperintensity or endplate destruction. Cord:  Normal signal and morphology.  No evidence of cord collection Paraspinal and other soft tissues: Extensive bilateral pneumonia with cavitary features and complicated bilateral pleural effusion. Cavitation and pleural fluid is progressed from comparison CT 02/07/2022 Disc levels: No herniation, impingement, or facet spurring IMPRESSION: 1. New area of marrow edema at the anterior inferior corner of T11, likely early osteomyelitis/apophysitis. 2. No evidence of cervical spine infection. 3. Negative for cervicothoracic canal collection. 4. Partially covered bilateral septic pneumonia and complex pleural effusions, progressed from chest CT 02/07/2022. Electronically Signed   By: Tiburcio Pea M.D.   On: 02/13/2022 13:02   US RENAL  Result Date: 02/15/2022 CLINICAL DATA:  Acute kidney injury. EXAM: RENAL / URINARY TRACT ULTRASOUND COMPLETE COMPARISON:  None Available. FINDINGS: Right Kidney: Renal measurements: 13.5 x 4.4 x 4.8 cm = volume: 148 mL. Increased echogenicity without hydronephrosis or mass lesion evident. Left Kidney: Renal measurements: 12.1 x 5.9 x 6.8 cm = volume: 253 mL. Echogenic parenchyma without mass lesion or hydronephrosis. Bladder: Appears normal for degree of bladder distention. Other: None. IMPRESSION: Echogenic kidneys bilaterally without hydronephrosis. Imaging features compatible with medical renal disease. Electronically Signed   By: Kennith Center M.D.   On: 02/15/2022 08:23    Family Communication: None at bedside  Disposition: Status is: Inpatient Remains inpatient appropriate because: Critically ill. Planned Discharge Destination:   TBD  Tyrone Nine, MD 02/15/2022 10:00 AM Page by [email or web address]

## 2022-02-15 NOTE — Progress Notes (Addendum)
Patient ID: Carol Rivera, female   DOB: 04-26-1991, 31 y.o.   MRN: [id number] [facility] Progress Note   Assessment/ Plan:   1.  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  Awaiting complement levels.  Renal function appears worse on labs this morning with uncharted urine output and paradoxical weight gain in spite of diuretic challenge overnight.  Will repeat furosemide today and request for strict input/output.  I am resistant to subject her to renal biopsy with metastatic MRSA infection hence management remains supportive. 2.  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  On daptomycin and ceftaroline. 3.  Anemia: Likely secondary to acute/critical illness.  With continued downtrend of hemoglobin/hematocrit and mild hyperkalemia, would be prudent to evaluate for Teflaro associated drug-induced auto hemolytic anemia.  I will send off for direct Coombs test, LDH and haptoglobin. 4.  Anasarca/hypoalbuminemia: I will quantify urine protein/creatinine ratio again for verification purposes and suspect that her low albumin is primarily driven by her acute illness/negative acute phase reaction.    Subjective:   Complains of shortness of breath even at rest.  Getting ready to receive blood transfusion.   Objective:   BP (!) 135/100 (BP Location: Right Arm)   Pulse 92   Temp 97.7 F (36.5 C) (Axillary)   Resp 18   Ht 5' (1.524 m)   Wt 74.8 kg   SpO2 94%   BMI 32.21 kg/m   Intake/Output Summary (Last 24 hours) at 02/15/2022 1039 Last data filed at 02/15/2022 4010 Gross per 24 hour  Intake 686.29 ml  Output --  Net 686.29 ml   Weight  change:   Physical Exam: Gen: Appears relatively comfortable resting in bed, sleeping on her side CVS: Pulse regular rhythm, normal rate, 3/6 holosystolic murmur Resp: Coarse breath sounds bilaterally diminished over right base Abd: Soft, flat, nontender, bowel sounds normal Ext: Trace lower extremity/upper extremity edema  Imaging: MR CERVICAL SPINE W WO CONTRAST  Result Date: 02/13/2022 CLINICAL DATA:  Mid back pain, worsening. Persistent bacteremia, evaluate for spinal infection EXAM: MRI CERVICAL AND THORACIC SPINE WITHOUT AND WITH CONTRAST TECHNIQUE: Multiplanar and multiecho pulse sequences of the cervical spine, to include the craniocervical junction and cervicothoracic junction, and the thoracic spine, were obtained without and with intravenous contrast. CONTRAST:  75mL GADAVIST GADOBUTROL 1 MMOL/ML IV SOLN COMPARISON:  02/07/2022 FINDINGS: MRI CERVICAL SPINE FINDINGS Very motion degraded Alignment: Physiologic. Vertebrae: No fracture, evidence of discitis, or bone lesion. Cord: Normal signal and morphology.  No evidence of collection Posterior Fossa, vertebral arteries, paraspinal tissues: Subcutaneous edema which is generalized. Disc levels: No evidence of herniation or impingement. MRI THORACIC SPINE FINDINGS Alignment:  Normal Vertebrae: New anterior inferior corner edema at T11. No disc T2 hyperintensity or endplate destruction. Cord:  Normal signal and morphology.  No evidence of cord collection Paraspinal and other soft tissues: Extensive bilateral pneumonia with cavitary features and complicated bilateral pleural effusion. Cavitation and pleural fluid is progressed from comparison CT 02/07/2022 Disc levels: No herniation, impingement, or facet spurring IMPRESSION: 1. New area of marrow edema at the anterior inferior corner of T11, likely early osteomyelitis/apophysitis. 2. No evidence of cervical spine infection. 3. Negative for cervicothoracic canal collection. 4. Partially covered  bilateral septic pneumonia  and complex pleural effusions, progressed from chest CT 02/07/2022. Electronically Signed   By: Tiburcio PeaJonathan  Watts M.D.   On: 02/13/2022 13:02   MR THORACIC SPINE W WO CONTRAST  Result Date: 02/13/2022 CLINICAL DATA:  Mid back pain, worsening. Persistent bacteremia, evaluate for spinal infection EXAM: MRI CERVICAL AND THORACIC SPINE WITHOUT AND WITH CONTRAST TECHNIQUE: Multiplanar and multiecho pulse sequences of the cervical spine, to include the craniocervical junction and cervicothoracic junction, and the thoracic spine, were obtained without and with intravenous contrast. CONTRAST:  7mL GADAVIST GADOBUTROL 1 MMOL/ML IV SOLN COMPARISON:  02/07/2022 FINDINGS: MRI CERVICAL SPINE FINDINGS Very motion degraded Alignment: Physiologic. Vertebrae: No fracture, evidence of discitis, or bone lesion. Cord: Normal signal and morphology.  No evidence of collection Posterior Fossa, vertebral arteries, paraspinal tissues: Subcutaneous edema which is generalized. Disc levels: No evidence of herniation or impingement. MRI THORACIC SPINE FINDINGS Alignment:  Normal Vertebrae: New anterior inferior corner edema at T11. No disc T2 hyperintensity or endplate destruction. Cord:  Normal signal and morphology.  No evidence of cord collection Paraspinal and other soft tissues: Extensive bilateral pneumonia with cavitary features and complicated bilateral pleural effusion. Cavitation and pleural fluid is progressed from comparison CT 02/07/2022 Disc levels: No herniation, impingement, or facet spurring IMPRESSION: 1. New area of marrow edema at the anterior inferior corner of T11, likely early osteomyelitis/apophysitis. 2. No evidence of cervical spine infection. 3. Negative for cervicothoracic canal collection. 4. Partially covered bilateral septic pneumonia and complex pleural effusions, progressed from chest CT 02/07/2022. Electronically Signed   By: Tiburcio PeaJonathan  Watts M.D.   On: 02/13/2022 13:02   US  RENAL  Result Date: 02/15/2022 CLINICAL DATA:  Acute kidney injury. EXAM: RENAL / URINARY TRACT ULTRASOUND COMPLETE COMPARISON:  None Available. FINDINGS: Right Kidney: Renal measurements: 13.5 x 4.4 x 4.8 cm = volume: 148 mL. Increased echogenicity without hydronephrosis or mass lesion evident. Left Kidney: Renal measurements: 12.1 x 5.9 x 6.8 cm = volume: 253 mL. Echogenic parenchyma without mass lesion or hydronephrosis. Bladder: Appears normal for degree of bladder distention. Other: None. IMPRESSION: Echogenic kidneys bilaterally without hydronephrosis. Imaging features compatible with medical renal disease. Electronically Signed   By: Kennith CenterEric  Mansell M.D.   On: 02/15/2022 08:23    Labs: BMET Recent Labs  Lab 02/09/22 0340 02/10/22 [id number] 02/11/22 0618 02/12/22 0224 02/13/22 0119 02/14/22 0551 02/15/22 0446  NA 133* 134* 137 136 136 136 137  K 3.7 3.5 3.4* 3.6 3.7 4.4 5.1  CL 101 104 110 109 110 108 111  CO2 24 20* 19* 22 20* 21* 19*  GLUCOSE 108* 55* 86 120* 108* 101* 102*  BUN 12 12 18  24* 24* 37* 53*  CREATININE 0.67 0.62 0.70 0.98 1.12* 2.06* 3.05*  CALCIUM 7.7* 7.2* 7.1* 6.9* 6.9* 7.1* 7.2*  PHOS  --  4.1  --   --   --   --  7.1*   CBC Recent Labs  Lab 02/12/22 0224 02/13/22 0119 02/14/22 0551 02/15/22 0446  WBC 21.0* 24.4* 26.8* 20.8*  HGB 8.3* 8.2* 7.3* 6.7*  HCT 25.2* 24.7* 22.5* 20.1*  MCV 73.7* 74.2* 73.3* 72.0*  PLT 212 287 358 389    Medications:     acetaminophen  1,000 mg Oral Q6H   albuterol  2.5 mg Nebulization Once   bisacodyl  10 mg Oral Daily   Chlorhexidine Gluconate Cloth  6 each Topical Daily   clonazePAM  0.5 mg Oral BID   docusate sodium  100 mg Oral BID   metoprolol  tartrate  12.5 mg Oral BID   mupirocin ointment   Nasal BID   nicotine  14 mg Transdermal Daily   QUEtiapine  25 mg Oral QHS   sodium chloride flush  3 mL Intravenous Q12H   Zetta Bills, MD 02/15/2022, 10:39 AM

## 2022-02-16 DIAGNOSIS — R7881 Bacteremia: Secondary | ICD-10-CM | POA: Diagnosis not present

## 2022-02-16 DIAGNOSIS — A419 Sepsis, unspecified organism: Secondary | ICD-10-CM | POA: Diagnosis not present

## 2022-02-16 DIAGNOSIS — N179 Acute kidney failure, unspecified: Secondary | ICD-10-CM | POA: Diagnosis not present

## 2022-02-16 DIAGNOSIS — R0781 Pleurodynia: Secondary | ICD-10-CM | POA: Diagnosis not present

## 2022-02-16 LAB — BPAM RBC
Blood Product Expiration Date: 202306112359
ISSUE DATE / TIME: 202305271048
Unit Type and Rh: 6200

## 2022-02-16 LAB — RENAL FUNCTION PANEL
Albumin: 1.9 g/dL — ABNORMAL LOW (ref 3.5–5.0)
Albumin: 2.2 g/dL — ABNORMAL LOW (ref 3.5–5.0)
Anion gap: 8 (ref 5–15)
Anion gap: 11 (ref 5–15)
BUN: 66 mg/dL — ABNORMAL HIGH (ref 6–20)
BUN: 71 mg/dL — ABNORMAL HIGH (ref 6–20)
CO2: 19 mmol/L — ABNORMAL LOW (ref 22–32)
CO2: 18 mmol/L — ABNORMAL LOW (ref 22–32)
Calcium: 7.2 mg/dL — ABNORMAL LOW (ref 8.9–10.3)
Calcium: 7.5 mg/dL — ABNORMAL LOW (ref 8.9–10.3)
Chloride: 109 mmol/L (ref 98–111)
Chloride: 107 mmol/L (ref 98–111)
Creatinine, Ser: 3.92 mg/dL — ABNORMAL HIGH (ref 0.44–1.00)
Creatinine, Ser: 4.52 mg/dL — ABNORMAL HIGH (ref 0.44–1.00)
GFR, Estimated: 15 mL/min — ABNORMAL LOW (ref >60)
GFR, Estimated: 13 mL/min — ABNORMAL LOW (ref >60)
Glucose, Bld: 94 mg/dL (ref 70–99)
Glucose, Bld: 86 mg/dL (ref 70–99)
Phosphorus: 9 mg/dL — ABNORMAL HIGH (ref 2.5–4.6)
Phosphorus: 9.9 mg/dL — ABNORMAL HIGH (ref 2.5–4.6)
Potassium: 5.8 mmol/L — ABNORMAL HIGH (ref 3.5–5.1)
Potassium: 6.2 mmol/L — ABNORMAL HIGH (ref 3.5–5.1)
Sodium: 136 mmol/L (ref 135–145)
Sodium: 136 mmol/L (ref 135–145)

## 2022-02-16 LAB — TYPE AND SCREEN
ABO/RH(D): A POS
Antibody Screen: NEGATIVE
Unit division: 0

## 2022-02-16 LAB — HEPATIC FUNCTION PANEL
ALT: 16 U/L (ref 0–44)
AST: 18 U/L (ref 15–41)
Albumin: 1.9 g/dL — ABNORMAL LOW (ref 3.5–5.0)
Alkaline Phosphatase: 78 U/L (ref 38–126)
Bilirubin, Direct: 0.5 mg/dL — ABNORMAL HIGH (ref 0.0–0.2)
Indirect Bilirubin: 1 mg/dL — ABNORMAL HIGH (ref 0.3–0.9)
Total Bilirubin: 1.5 mg/dL — ABNORMAL HIGH (ref 0.3–1.2)
Total Protein: 5.7 g/dL — ABNORMAL LOW (ref 6.5–8.1)

## 2022-02-16 LAB — ABO/RH: ABO/RH(D): A POS

## 2022-02-16 LAB — CBC
HCT: 23.5 % — ABNORMAL LOW (ref 36.0–46.0)
Hemoglobin: 7.7 g/dL — ABNORMAL LOW (ref 12.0–15.0)
MCH: 23.8 pg — ABNORMAL LOW (ref 26.0–34.0)
MCHC: 32.8 g/dL (ref 30.0–36.0)
MCV: 72.8 fL — ABNORMAL LOW (ref 80.0–100.0)
Platelets: 410 10*3/uL — ABNORMAL HIGH (ref 150–400)
RBC: 3.23 MIL/uL — ABNORMAL LOW (ref 3.87–5.11)
RDW: 23.3 % — ABNORMAL HIGH (ref 11.5–15.5)
WBC: 17.6 10*3/uL — ABNORMAL HIGH (ref 4.0–10.5)
nRBC: 0 % (ref 0.0–0.2)

## 2022-02-16 LAB — GLUCOSE, CAPILLARY
Glucose-Capillary: 96 mg/dL (ref 70–99)
Glucose-Capillary: 93 mg/dL (ref 70–99)
Glucose-Capillary: 84 mg/dL (ref 70–99)
Glucose-Capillary: 78 mg/dL (ref 70–99)

## 2022-02-16 LAB — CK: Total CK: 23 U/L — ABNORMAL LOW (ref 38–234)

## 2022-02-16 MED ORDER — ALBUMIN HUMAN 25 % IV SOLN
25.0000 g | Freq: Once | INTRAVENOUS | Status: AC
Start: 2022-02-16 — End: 2022-02-16
  Administered 2022-02-16: 25 g via INTRAVENOUS
  Filled 2022-02-16: qty 100

## 2022-02-16 MED ORDER — FUROSEMIDE 10 MG/ML IJ SOLN
80.0000 mg | Freq: Once | INTRAMUSCULAR | Status: AC
  Administered 2022-02-16: 80 mg via INTRAVENOUS
  Filled 2022-02-16: qty 8

## 2022-02-16 MED ORDER — SODIUM ZIRCONIUM CYCLOSILICATE 10 G PO PACK
10.0000 g | PACK | Freq: Three times a day (TID) | ORAL | Status: AC
  Administered 2022-02-16 – 2022-02-17 (×2): 10 g via ORAL
  Filled 2022-02-16 (×2): qty 1

## 2022-02-16 MED ORDER — SODIUM ZIRCONIUM CYCLOSILICATE 10 G PO PACK
10.0000 g | PACK | Freq: Every day | ORAL | Status: DC
  Administered 2022-02-16: 10 g via ORAL
  Filled 2022-02-16: qty 1

## 2022-02-16 MED ORDER — CHLORHEXIDINE GLUCONATE CLOTH 2 % EX PADS
6.0000 | MEDICATED_PAD | Freq: Every day | CUTANEOUS | Status: DC
  Administered 2022-02-17 – 2022-02-23 (×4): 6 via TOPICAL

## 2022-02-16 MED ORDER — CALCIUM GLUCONATE-NACL 1-0.675 GM/50ML-% IV SOLN
1.0000 g | Freq: Once | INTRAVENOUS | Status: AC
  Administered 2022-02-16: 1000 mg via INTRAVENOUS
  Filled 2022-02-16: qty 50

## 2022-02-16 MED ORDER — SODIUM ZIRCONIUM CYCLOSILICATE 10 G PO PACK: 10.0000 g | PACK | Freq: Every day | ORAL | Status: DC

## 2022-02-16 MED ORDER — ALBUMIN HUMAN 25 % IV SOLN
25.0000 g | Freq: Once | INTRAVENOUS | Status: AC
  Administered 2022-02-16: 25 g via INTRAVENOUS
  Filled 2022-02-16: qty 100

## 2022-02-16 NOTE — Progress Notes (Addendum)
Patient ID: Carol Rivera, female   DOB: 04/06/1991, 31 y.o.   MRN: [id number] [facility] Progress Note   Assessment/ Plan:   1.  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  Awaiting complement levels.  Renal function worse with non-oliguria but urine output tailing off.  I will challenge again with a dose of furosemide today and request IR to place a temporary dialysis catheter tomorrow morning anticipating dialysis needs within the next 24 hours especially with her hyperkalemia and volume status. 2.  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  On daptomycin and ceftaroline. 3.  Anemia: Likely secondary to acute/critical illness.  With continued downtrend of hemoglobin/hematocrit and mild hyperkalemia, would be prudent to evaluate for Teflaro associated drug-induced auto hemolytic anemia.  Elevated LDH with negative direct Coombs test and pending haptoglobin. 4.  Anasarca/hypoalbuminemia: I will quantify urine protein/creatinine ratio again for verification purposes and suspect that her low albumin is primarily driven by her acute illness/negative acute phase reaction. 5.  Hyperkalemia: Secondary to acute kidney injury, agree with Lokelma dosing and convert to renal diet.  Subjective:   Continues to have intermittent shortness of breath but denies chest pain   Objective:   BP 133/81 (BP Location: Left Leg)   Pulse 85   Temp 97.8 F (36.6 C) (Axillary)   Resp 18   Ht 5' (1.524 m)   Wt 74.5 kg   SpO2 94%   BMI 32.08 kg/m   Intake/Output Summary (Last 24 hours) at 02/16/2022 1033 Last data filed at 02/15/2022 2200 Gross per  24 hour  Intake 968.03 ml  Output 725 ml  Net 243.03 ml   Weight change: -0.3 kg  Physical Exam: Gen: Somnolent, resting comfortably in bed, awakens to conversation CVS: Pulse regular rhythm, normal rate, 3/6 holosystolic murmur Resp: Coarse breath sounds bilaterally diminished over right base Abd: Soft, flat, nontender, bowel sounds normal Ext: Trace lower extremity/upper extremity edema  Imaging: US RENAL  Result Date: 02/15/2022 CLINICAL DATA:  Acute kidney injury. EXAM: RENAL / URINARY TRACT ULTRASOUND COMPLETE COMPARISON:  None Available. FINDINGS: Right Kidney: Renal measurements: 13.5 x 4.4 x 4.8 cm = volume: 148 mL. Increased echogenicity without hydronephrosis or mass lesion evident. Left Kidney: Renal measurements: 12.1 x 5.9 x 6.8 cm = volume: 253 mL. Echogenic parenchyma without mass lesion or hydronephrosis. Bladder: Appears normal for degree of bladder distention. Other: None. IMPRESSION: Echogenic kidneys bilaterally without hydronephrosis. Imaging features compatible with medical renal disease. Electronically Signed   By: Kennith Center M.D.   On: 02/15/2022 08:23    Labs: BMET Recent Labs  Lab 02/10/22 1594 02/11/22 0618 02/12/22 0224 02/13/22 0119 02/14/22 0551 02/15/22 0446 02/16/22 0509  NA 134* 137 136 136 136 137 136  K 3.5 3.4* 3.6 3.7 4.4 5.1 5.8*  CL 104 110 109 110 108 111 109  CO2 20* 19* 22 20* 21* 19* 19*  GLUCOSE 55* 86 120* 108* 101* 102* 94  BUN 12 18 24* 24* 37* 53* 66*  CREATININE 0.62 0.70 0.98 1.12* 2.06* 3.05* 3.92*  CALCIUM 7.2* 7.1* 6.9* 6.9* 7.1* 7.2* 7.2*  PHOS 4.1  --   --   --   --  7.1* 9.0*  CBC Recent Labs  Lab 02/13/22 0119 02/14/22 0551 02/15/22 0446 02/15/22 1730 02/16/22 0509  WBC 24.4* 26.8* 20.8*  --  17.6*  HGB 8.2* 7.3* 6.7* 9.5* 7.7*  HCT 24.7* 22.5* 20.1* 28.5* 23.5*  MCV 74.2* 73.3* 72.0*  --  72.8*  PLT 287 358 389  --  410*    Medications:     acetaminophen  1,000 mg Oral Q6H   bisacodyl  10 mg Oral  Daily   Chlorhexidine Gluconate Cloth  6 each Topical Daily   clonazePAM  0.5 mg Oral BID   docusate sodium  100 mg Oral BID   metoprolol tartrate  12.5 mg Oral BID   mupirocin ointment   Nasal BID   nicotine  14 mg Transdermal Daily   QUEtiapine  25 mg Oral QHS   sodium chloride flush  3 mL Intravenous Q12H   sodium zirconium cyclosilicate  10 g Oral Daily   Zetta Bills, MD 02/16/2022, 10:33 AM

## 2022-02-16 NOTE — Progress Notes (Signed)
   Patient Status: [facility] - In-pt  Assessment and Plan: Patient in need of venous access for dialysis initiation.  Patient with MRSA bacteremia, sepsis.  She is drowsy but arousable today.  Clearly able to state that she needs dialysis.  Plan to proceed tomorrow as schedule allows and as patient cooperates.  Does not need to be NPO, no sedation planned.   Risks and benefits discussed with the patient including, but not limited to bleeding, infection, vascular injury, pneumothorax which may require chest tube placement, air embolism or even death  All of the patient's questions were answered, patient is agreeable to proceed. Consent signed and in chart.  ______________________________________________________________________   History of Present Illness: Carol Rivera is a 31 y.o. 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  Now with acute renal filuare, hyperkalemia.  IR consulted for temporary dialysis catheter placement.   Allergies and medications reviewed.   Review of Systems: A 12 point ROS discussed and pertinent positives are indicated in the HPI above.  All other systems are negative.  Review of Systems  Unable to perform ROS: Other  Patient not cooperative  Vital Signs: BP (!) 142/95 (BP Location: Left Leg)   Pulse 87   Temp 98.1 F (36.7 C) (Axillary)   Resp 19   Ht 5' (1.524 m)   Wt 164 lb 3.9 oz (74.5 kg)   SpO2 94%   BMI 32.08 kg/m   Physical Exam Vitals and nursing note reviewed.  Constitutional:      General: She is not in acute distress.    Appearance: She is ill-appearing.  Cardiovascular:     Rate and Rhythm: Normal rate.  Pulmonary:     Effort: Pulmonary effort is normal. No respiratory distress.     Breath sounds: Normal breath sounds.  Abdominal:     General: Abdomen is flat.     Palpations: Abdomen is  soft.  Skin:    General: Skin is warm and dry.  Neurological:     General: No focal deficit present.     Mental Status: She is oriented to person, place, and time.  Psychiatric:        Mood and Affect: Mood normal.     Imaging reviewed.   Labs:  COAGS: Recent Labs    02/06/22 2020  INR 1.3*  APTT 28    BMP: Recent Labs    02/13/22 0119 02/14/22 0551 02/15/22 0446 02/16/22 0509  NA 136 136 137 136  K 3.7 4.4 5.1 5.8*  CL 110 108 111 109  CO2 20* 21* 19* 19*  GLUCOSE 108* 101* 102* 94  BUN 24* 37* 53* 66*  CALCIUM 6.9* 7.1* 7.2* 7.2*  CREATININE 1.12* 2.06* 3.05* 3.92*  GFRNONAA >60 32* 20* 15*     Electronically Signed: Hoyt Koch, PA 02/16/2022, 3:01 PM   I spent a total of 15 minutes in face to face in clinical consultation, greater than 50% of which was counseling/coordinating care for acute renal failure.

## 2022-02-16 NOTE — Progress Notes (Signed)
Progress Note  Patient: Carol Rivera GYB:[id number] DOB: 01-Jul-1991  DOA: 02/06/2022  DOS: 02/16/2022    Brief hospital course: HARRYETTE SHUART is a 31 y.o. female with a history of IVDU who presented 5/18 with fever, cough, chest pain and left shoulder pain. She was found to have septic shock due to MRSA bacteremia as well as large destructive TV endocarditis with TR. IV antibiotics were continued and tailored to cultures which were persistently positive until 5/25 which have remain NGTD. CT surgery stated the location (ventricular side) of the vegetations is not accessible by angiovac and she is not a candidate for valve replacement. IV daptomycin and ceftaroline have been continued per ID recommendations. Course complicated by left AC septic joint s/p I&D 5/22, anemia of acute illness s/p 1u PRBCs 5/27, and renal failure with anasarca for which dialysis may be required.   Assessment and Plan: MRSA bacteremia with multifocal metastatic infections, tricuspid valve endocarditis with TR: CT surgery eval 5/22 not accessible via angiovac, and not a candidate for valve replacement given recent IV drug abuse and valve destruction. Blood cultures were persistently positive, though 5/25 set is NGTD.  - Daptomycin, teflaro per ID. Dosing per pharmacy.   Anemia of acute illness:  - H/H stable s/p 1u PRBCs 5/27. No active bleeding, Coombs negative.  Acute renal failure: With hyperkalemia, metabolic acidosis, declining urine output, suspect she may require HD for which nephrology has consulted IR for access. Renal U/S with increased echogenicity, no hydro. - Repeating albumin/lasix. Appreciate nephrology assistance. - Maintain strict I/O - DC'ed toradol, avoid NSAIDs. - Complement pending, though has active findings on UA.  Hyperkalemia:  - Repeat loop diuretic as above - Lokelma - Monitor cardiac telemetry.  - Renal diet - Recheck in PM.  Wheezing: Resolved. - Continue albuterol prn  T11  osteomyelitis: demonstrating T2-hyperintensity, suspect new osteomyelitis. Dr. Maurice Small, neurosurgery evaluated the patient on 5/26, recommending continuation of antibiotics without acute neurosurgical intervention. +clonus on L > R so could consider repeat MR brain given her risk of septic cerebral emboli.  - Continue oxycodone prn moderate and hydromorphone prn severe pain. Will likely consolidate into hydromorphone po only in the coming days given renal failure and ability to take po/risk for polypharmacy.  Left AC septic arthritis: s/p I&D 5/22.  - Wound care per orthopedics.  - Per Dr. August Saucer, plan is for open distal clavicle excision and debridement of AC joint.  IVDU:  - Cessation counseling provided.   Acute metabolic encephalopathy: Improved from admission.  - Continue supportive seroquel and clonazepam.   Obesity: Estimated body mass index is 32.08 kg/m as calculated from the following:   Height as of this encounter: 5' (1.524 m).   Weight as of this encounter: 74.5 kg.  Subjective: Tired this morning, didn't sleep well, doesn't have other complaints at this time.  Objective: Vitals:   02/15/22 2300 02/16/22 0232 02/16/22 0312 02/16/22 0824  BP: (!) 128/101  121/82 133/81  Pulse: 97  91 85  Resp: 16  20 18   Temp: 97.8 F (36.6 C)  97.8 F (36.6 C)   TempSrc: Oral  Axillary   SpO2: 96%  95% 94%  Weight:  74.5 kg    Height:       Gen: Chronically ill-appearing female in no distress Pulm: Nonlabored breathing room air. Clear. CV: Regular rate and rhythm. +murmur without rub, or gallop. No JVD, diffuse stable edema. GI: Abdomen soft, non-tender, non-distended, with normoactive bowel sounds.  Ext: Warm, no deformities  Skin: No rashes, lesions or ulcers on visualized skin. Neuro: Alert and oriented. No focal neurological deficits. Psych: Judgement and insight appear fair. Mood euthymic & affect congruent. Behavior is appropriate.    Data Personally  reviewed: CBC: Recent Labs  Lab 02/12/22 0224 02/13/22 0119 02/14/22 0551 02/15/22 0446 02/15/22 1730 02/16/22 0509  WBC 21.0* 24.4* 26.8* 20.8*  --  17.6*  HGB 8.3* 8.2* 7.3* 6.7* 9.5* 7.7*  HCT 25.2* 24.7* 22.5* 20.1* 28.5* 23.5*  MCV 73.7* 74.2* 73.3* 72.0*  --  72.8*  PLT 212 287 358 389  --  410*   Basic Metabolic Panel: Recent Labs  Lab 02/10/22 0653 02/11/22 0618 02/12/22 0224 02/13/22 0119 02/14/22 0551 02/15/22 0446 02/16/22 0509  NA 134* 137 136 136 136 137 136  K 3.5 3.4* 3.6 3.7 4.4 5.1 5.8*  CL 104 110 109 110 108 111 109  CO2 20* 19* 22 20* 21* 19* 19*  GLUCOSE 55* 86 120* 108* 101* 102* 94  BUN 12 18 24* 24* 37* 53* 66*  CREATININE 0.62 0.70 0.98 1.12* 2.06* 3.05* 3.92*  CALCIUM 7.2* 7.1* 6.9* 6.9* 7.1* 7.2* 7.2*  MG 2.0 2.1 2.1 2.1 2.0 2.1  --   PHOS 4.1  --   --   --   --  7.1* 9.0*   GFR: Estimated Creatinine Clearance: 18.7 mL/min (A) (by C-G formula based on SCr of 3.92 mg/dL (H)). Liver Function Tests: Recent Labs  Lab 02/11/22 0618 02/12/22 0224 02/13/22 0119 02/14/22 0551 02/15/22 0446 02/16/22 0509  AST 43* 34 27 23  --  18  ALT 46* 36 28 22  --  16  ALKPHOS 105 107 102 101  --  78  BILITOT 4.8* 2.0* 1.4* 1.4*  --  1.5*  PROT 5.2* 5.1* 5.1* 5.3*  --  5.7*  ALBUMIN 1.6* <1.5* <1.5* <1.5* <1.5* 1.9*  1.9*   No results for input(s): LIPASE, AMYLASE in the last 168 hours. No results for input(s): AMMONIA in the last 168 hours. Coagulation Profile: No results for input(s): INR, PROTIME in the last 168 hours. Cardiac Enzymes: Recent Labs  Lab 02/16/22 0509  CKTOTAL 23*   BNP (last 3 results) No results for input(s): PROBNP in the last 8760 hours. HbA1C: No results for input(s): HGBA1C in the last 72 hours. CBG: Recent Labs  Lab 02/15/22 0808 02/15/22 1124 02/15/22 1543 02/15/22 2110 02/16/22 0826  GLUCAP 97 97 105* 104* 96   Lipid Profile: No results for input(s): CHOL, HDL, LDLCALC, TRIG, CHOLHDL, LDLDIRECT in the  last 72 hours. Thyroid Function Tests: No results for input(s): TSH, T4TOTAL, FREET4, T3FREE, THYROIDAB in the last 72 hours. Anemia Panel: No results for input(s): VITAMINB12, FOLATE, FERRITIN, TIBC, IRON, RETICCTPCT in the last 72 hours. Urine analysis:    Component Value Date/Time   COLORURINE AMBER (A) 02/14/2022 1751   APPEARANCEUR CLOUDY (A) 02/14/2022 1751   LABSPEC 1.014 02/14/2022 1751   PHURINE 5.0 02/14/2022 1751   GLUCOSEU NEGATIVE 02/14/2022 1751   HGBUR LARGE (A) 02/14/2022 1751   BILIRUBINUR NEGATIVE 02/14/2022 1751   KETONESUR NEGATIVE 02/14/2022 1751   PROTEINUR 100 (A) 02/14/2022 1751   UROBILINOGEN 0.2 02/24/2012 2301   NITRITE NEGATIVE 02/14/2022 1751   LEUKOCYTESUR SMALL (A) 02/14/2022 1751   Recent Results (from the past 240 hour(s))  Blood culture (routine x 2)     Status: Abnormal   Collection Time: 02/06/22  8:15 PM   Specimen: BLOOD  Result Value Ref Range Status   Specimen Description  Final    BLOOD RIGHT ANTECUBITAL Performed at [facility], [address], [place], [place] [address]    Special Requests   Final    BOTTLES DRAWN AEROBIC ONLY Blood Culture results may not be optimal due to an inadequate volume of blood received in culture bottles Performed at [facility], [address] [place], [place] [address]    Culture  Setup Time   Final    GRAM POSITIVE COCCI IN CLUSTERS AEROBIC BOTTLE ONLY    Culture (A)  Final    STAPHYLOCOCCUS AUREUS SUSCEPTIBILITIES PERFORMED ON PREVIOUS CULTURE WITHIN THE LAST 5 DAYS. Performed at [facility], [address], [place], [place] [address]    Report Status 02/09/2022 FINAL  Final  Blood culture (routine x 2)     Status: Abnormal   Collection Time: 02/06/22  8:20 PM   Specimen: BLOOD  Result Value Ref Range Status   Specimen Description   Final    BLOOD BLOOD RIGHT HAND Performed at [facility], [address] [place], [place]  [address]    Special Requests   Final    BOTTLES DRAWN AEROBIC AND ANAEROBIC Blood Culture results may not be optimal due to an excessive volume of blood received in culture bottles Performed at [facility], [address], [place], [place] [address]    Culture  Setup Time   Final    GRAM POSITIVE COCCI IN CLUSTERS IN BOTH AEROBIC AND ANAEROBIC BOTTLES CRITICAL RESULT CALLED TO, READ BACK BY AND VERIFIED WITH: Oneta Rack [id number] AT 1205 BY CM Performed at [facility], [address], [place], [place] [address]    Culture METHICILLIN RESISTANT STAPHYLOCOCCUS AUREUS (A)  Final   Report Status 02/09/2022 FINAL  Final   Organism ID, Bacteria METHICILLIN RESISTANT STAPHYLOCOCCUS AUREUS  Final      Susceptibility   Methicillin resistant staphylococcus aureus - MIC*    CIPROFLOXACIN >=8 RESISTANT Resistant     ERYTHROMYCIN >=8 RESISTANT Resistant     GENTAMICIN <=0.5 SENSITIVE Sensitive     OXACILLIN >=4 RESISTANT Resistant     TETRACYCLINE <=1 SENSITIVE Sensitive     VANCOMYCIN 1 SENSITIVE Sensitive     TRIMETH/SULFA 160 RESISTANT Resistant     CLINDAMYCIN <=0.25 SENSITIVE Sensitive     RIFAMPIN <=0.5 SENSITIVE Sensitive     Inducible Clindamycin NEGATIVE Sensitive     * METHICILLIN RESISTANT STAPHYLOCOCCUS AUREUS  Blood Culture ID Panel (Reflexed)     Status: Abnormal   Collection Time: 02/06/22  8:20 PM  Result Value Ref Range Status   Enterococcus faecalis NOT DETECTED NOT DETECTED Final   Enterococcus Faecium NOT DETECTED NOT DETECTED Final   Listeria monocytogenes NOT DETECTED NOT DETECTED Final   Staphylococcus species DETECTED (A) NOT DETECTED Final    Comment: CRITICAL RESULT CALLED TO, READ BACK BY AND VERIFIED WITH: PHARMD M BELL [id number] AT 1205 BY CM    Staphylococcus aureus (BCID) DETECTED (A) NOT DETECTED Final    Comment: Methicillin (oxacillin)-resistant Staphylococcus aureus (MRSA). MRSA is predictably resistant to beta-lactam antibiotics  (except ceftaroline). Preferred therapy is vancomycin unless clinically contraindicated. Patient requires contact precautions if  hospitalized. CRITICAL RESULT CALLED TO, READ BACK BY AND VERIFIED WITH: PHARMD M BELL [id number] AT 1205 BY CM    Staphylococcus epidermidis NOT DETECTED NOT DETECTED Final   Staphylococcus lugdunensis NOT DETECTED NOT DETECTED Final   Streptococcus species NOT DETECTED NOT DETECTED Final   Streptococcus  agalactiae NOT DETECTED NOT DETECTED Final   Streptococcus pneumoniae NOT DETECTED NOT DETECTED Final   Streptococcus pyogenes NOT DETECTED NOT DETECTED Final   A.calcoaceticus-baumannii NOT DETECTED NOT DETECTED Final   Bacteroides fragilis NOT DETECTED NOT DETECTED Final   Enterobacterales NOT DETECTED NOT DETECTED Final   Enterobacter cloacae complex NOT DETECTED NOT DETECTED Final   Escherichia coli NOT DETECTED NOT DETECTED Final   Klebsiella aerogenes NOT DETECTED NOT DETECTED Final   Klebsiella oxytoca NOT DETECTED NOT DETECTED Final   Klebsiella pneumoniae NOT DETECTED NOT DETECTED Final   Proteus species NOT DETECTED NOT DETECTED Final   Salmonella species NOT DETECTED NOT DETECTED Final   Serratia marcescens NOT DETECTED NOT DETECTED Final   Haemophilus influenzae NOT DETECTED NOT DETECTED Final   Neisseria meningitidis NOT DETECTED NOT DETECTED Final   Pseudomonas aeruginosa NOT DETECTED NOT DETECTED Final   Stenotrophomonas maltophilia NOT DETECTED NOT DETECTED Final   Candida albicans NOT DETECTED NOT DETECTED Final   Candida auris NOT DETECTED NOT DETECTED Final   Candida glabrata NOT DETECTED NOT DETECTED Final   Candida krusei NOT DETECTED NOT DETECTED Final   Candida parapsilosis NOT DETECTED NOT DETECTED Final   Candida tropicalis NOT DETECTED NOT DETECTED Final   Cryptococcus neoformans/gattii NOT DETECTED NOT DETECTED Final   Meth resistant mecA/C and MREJ DETECTED (A) NOT DETECTED Final    Comment: CRITICAL RESULT CALLED TO, READ BACK BY  AND VERIFIED WITH: Oneta RackHARMD M BELL [id number] AT 1205 BY CM Performed at [facility], [address] [place], [place] [address]   Resp Panel by RT-PCR (Flu A&B, Covid) Peripheral     Status: None   Collection Time: 02/06/22  8:38 PM   Specimen: Peripheral; Nasopharyngeal(NP) swabs in vial transport medium  Result Value Ref Range Status   SARS Coronavirus 2 by RT PCR NEGATIVE NEGATIVE Final    Comment: (NOTE) SARS-CoV-2 target nucleic acids are NOT DETECTED.  The SARS-CoV-2 RNA is generally detectable in upper respiratory specimens during the acute phase of infection. The lowest concentration of SARS-CoV-2 viral copies this assay can detect is 138 copies/mL. A negative result does not preclude SARS-Cov-2 infection and should not be used as the sole basis for treatment or other patient management decisions. A negative result may occur with  improper specimen collection/handling, submission of specimen other than nasopharyngeal swab, presence of viral mutation(s) within the areas targeted by this assay, and inadequate number of viral copies(<138 copies/mL). A negative result must be combined with clinical observations, patient history, and epidemiological information. The expected result is Negative.  Fact Sheet for Patients:  BloggerCourse.comhttps://www.fda.gov/media/152166/download  Fact Sheet for Healthcare Providers:  SeriousBroker.ithttps://www.fda.gov/media/152162/download  This test is no t yet approved or cleared by the [place] FDA and  has been authorized for detection and/or diagnosis of SARS-CoV-2 by FDA under an Emergency Use Authorization (EUA). This EUA will remain  in effect (meaning this test can be used) for the duration of the COVID-19 declaration under Section 564(b)(1) of the Act, 21 U.S.C.section 360bbb-3(b)(1), unless the authorization is terminated  or revoked sooner.       Influenza A by PCR NEGATIVE NEGATIVE Final   Influenza B by PCR NEGATIVE NEGATIVE Final    Comment:  (NOTE) The Xpert Xpress SARS-CoV-2/FLU/RSV plus assay is intended as an aid in the diagnosis of influenza from Nasopharyngeal swab specimens and should not be used as a sole basis for treatment. Nasal washings and aspirates are unacceptable for Xpert Xpress SARS-CoV-2/FLU/RSV testing.  Fact  Sheet for Patients: BloggerCourse.com  Fact Sheet for Healthcare Providers: SeriousBroker.it  This test is not yet approved or cleared by the [place] FDA and has been authorized for detection and/or diagnosis of SARS-CoV-2 by FDA under an Emergency Use Authorization (EUA). This EUA will remain in effect (meaning this test can be used) for the duration of the COVID-19 declaration under Section 564(b)(1) of the Act, 21 U.S.C. section 360bbb-3(b)(1), unless the authorization is terminated or revoked.  Performed at [facility], [address] [place], [place] [address]   Urine Culture     Status: Abnormal   Collection Time: 02/07/22 12:23 AM   Specimen: In/Out Cath Urine  Result Value Ref Range Status   Specimen Description   Final    IN/OUT CATH URINE Performed at [facility], [address] [place], [place] [address]    Special Requests   Final    NONE Performed at [facility], [address] [place], [place] [address]    Culture (A)  Final    >=100,000 COLONIES/mL METHICILLIN RESISTANT STAPHYLOCOCCUS AUREUS   Report Status 02/09/2022 FINAL  Final   Organism ID, Bacteria METHICILLIN RESISTANT STAPHYLOCOCCUS AUREUS (A)  Final      Susceptibility   Methicillin resistant staphylococcus aureus - MIC*    CIPROFLOXACIN >=8 RESISTANT Resistant     GENTAMICIN <=0.5 SENSITIVE Sensitive     NITROFURANTOIN <=16 SENSITIVE Sensitive     OXACILLIN >=4 RESISTANT Resistant     TETRACYCLINE <=1 SENSITIVE Sensitive     VANCOMYCIN 1 SENSITIVE Sensitive     TRIMETH/SULFA >=320 RESISTANT  Resistant     CLINDAMYCIN <=0.25 SENSITIVE Sensitive     RIFAMPIN <=0.5 SENSITIVE Sensitive     Inducible Clindamycin NEGATIVE Sensitive     * >=100,000 COLONIES/mL METHICILLIN RESISTANT STAPHYLOCOCCUS AUREUS  MRSA Next Gen by PCR, Nasal     Status: Abnormal   Collection Time: 02/07/22 12:43 AM   Specimen: Nasal Mucosa; Nasal Swab  Result Value Ref Range Status   MRSA by PCR Next Gen DETECTED (A) NOT DETECTED Final    Comment: CRITICAL RESULT CALLED TO, READ BACK BY AND VERIFIED WITH: CALLED TO ZAHOUREK,A AT 2130 ON 02/07/22 BY VAZQUEZ,J (NOTE) The GeneXpert MRSA Assay (FDA approved for NASAL specimens only), is one component of a comprehensive MRSA colonization surveillance program. It is not intended to diagnose MRSA infection nor to guide or monitor treatment for MRSA infections. Test performance is not FDA approved in patients less than 35 years old. Performed at [facility], [address] [place], [place] [address]   Culture, blood (single) w Reflex to ID Panel     Status: Abnormal   Collection Time: 02/07/22  6:58 AM   Specimen: BLOOD  Result Value Ref Range Status   Specimen Description   Final    BLOOD BLOOD LEFT WRIST Performed at [facility], [address], [place], [place] [address]    Special Requests   Final    IN PEDIATRIC BOTTLE Blood Culture adequate volume Performed at [facility], [address] [place], [place] [address]    Culture  Setup Time   Final    GRAM POSITIVE COCCI IN CLUSTERS IN PEDIATRIC BOTTLE CRITICAL VALUE NOTED.  VALUE IS CONSISTENT WITH PREVIOUSLY REPORTED AND CALLED VALUE.    Culture (A)  Final    STAPHYLOCOCCUS AUREUS SUSCEPTIBILITIES PERFORMED ON PREVIOUS CULTURE WITHIN THE LAST 5 DAYS. Performed at [facility],  [address], [place], [place] [address]    Report Status 02/09/2022 FINAL  Final  Culture, blood (Routine X 2) w Reflex to ID Panel     Status: Abnormal    Collection Time: 02/08/22  2:58 AM   Specimen: BLOOD  Result Value Ref Range Status   Specimen Description   Final    BLOOD BLOOD LEFT HAND Performed at [facility], [address], [place], [place] [address]    Special Requests   Final    IN PEDIATRIC BOTTLE Blood Culture adequate volume Performed at [facility], [address] [place], [place] [address]    Culture  Setup Time   Final    GRAM POSITIVE COCCI IN CLUSTERS AEROBIC BOTTLE ONLY CRITICAL VALUE NOTED.  VALUE IS CONSISTENT WITH PREVIOUSLY REPORTED AND CALLED VALUE.    Culture (A)  Final    STAPHYLOCOCCUS AUREUS SUSCEPTIBILITIES PERFORMED ON PREVIOUS CULTURE WITHIN THE LAST 5 DAYS. Performed at [facility], [address], [place], [place] [address]    Report Status 02/10/2022 FINAL  Final  Culture, blood (Routine X 2) w Reflex to ID Panel     Status: Abnormal (Preliminary result)   Collection Time: 02/08/22  2:59 AM   Specimen: BLOOD  Result Value Ref Range Status   Specimen Description   Final    BLOOD RIGHT ANTECUBITAL Performed at [facility], [address], [place], [place] [address]    Special Requests   Final    IN PEDIATRIC BOTTLE Blood Culture adequate volume Performed at [facility], [address] [place], [place] [address]    Culture  Setup Time   Final    GRAM POSITIVE COCCI IN CLUSTERS AEROBIC BOTTLE ONLY CRITICAL VALUE NOTED.  VALUE IS CONSISTENT WITH PREVIOUSLY REPORTED AND CALLED VALUE.    Culture (A)  Final    STAPHYLOCOCCUS AUREUS Sent to [facility] for further susceptibility testing. Performed at [facility], [address] [place], [place] [address]    Report Status PENDING  Incomplete  Min Inhibitory Conc (2 Drugs)     Status: None   Collection Time: 02/08/22  2:59 AM  Result Value Ref Range Status   Min Inhibitory Conc (2 Drugs) Final report  Corrected    Comment: (NOTE) Performed At: [facility] [address] [place], [place] [id number] Jolene Schimke MD [id number] CORRECTED ON 05/27 AT 0936: PREVIOUSLY REPORTED AS Preliminary report    Source (MIC2) BLOOD  Final    Comment: Performed at [facility], [address] [place], [place] [address]  MIC Results (2 Drugs)     Status: None   Collection Time: 02/08/22  2:59 AM  Result Value Ref Range Status   RESULT 5 MIC (RESULT 1)                Base Name: Staphylococcus aureus  Final    Comment: (NOTE) Identification performed by account, not confirmed by this laboratory. CEFTAROLINE     <=1 UG/ML = SUSCEPTIBLE DAPTOMYCIN      <=1 UG/ML = SUSCEPTIBLE Performed At: [facility] [address] [place], [place] [id number] Jolene Schimke MD [id number]   Culture, blood (Routine X 2) w Reflex to ID Panel     Status: Abnormal   Collection Time: 02/10/22  6:43 AM   Specimen: BLOOD  Result Value Ref Range Status   Specimen Description BLOOD LEFT ANTECUBITAL  Final   Special Requests  Final    BOTTLES DRAWN AEROBIC AND ANAEROBIC Blood Culture adequate volume   Culture  Setup Time   Final    GRAM POSITIVE COCCI IN CLUSTERS IN BOTH AEROBIC AND ANAEROBIC BOTTLES CRITICAL RESULT CALLED TO, READ BACK BY AND VERIFIED WITH: Tandy Gaw [id number] [id number] FCP Performed at [facility], [address], [place], [place] [address]    Culture METHICILLIN RESISTANT STAPHYLOCOCCUS AUREUS (A)  Final   Report Status 02/14/2022 FINAL  Final   Organism ID, Bacteria METHICILLIN RESISTANT STAPHYLOCOCCUS AUREUS  Final      Susceptibility   Methicillin resistant staphylococcus aureus - MIC*    CIPROFLOXACIN >=8 RESISTANT Resistant     ERYTHROMYCIN >=8 RESISTANT Resistant     GENTAMICIN <=0.5 SENSITIVE Sensitive     OXACILLIN >=4 RESISTANT Resistant     TETRACYCLINE <=1 SENSITIVE Sensitive     VANCOMYCIN 1 SENSITIVE Sensitive     TRIMETH/SULFA >=320 RESISTANT Resistant     CLINDAMYCIN <=0.25  SENSITIVE Sensitive     RIFAMPIN <=0.5 SENSITIVE Sensitive     Inducible Clindamycin NEGATIVE Sensitive     * METHICILLIN RESISTANT STAPHYLOCOCCUS AUREUS  Blood Culture ID Panel (Reflexed)     Status: Abnormal   Collection Time: 02/10/22  6:43 AM  Result Value Ref Range Status   Enterococcus faecalis NOT DETECTED NOT DETECTED Final   Enterococcus Faecium NOT DETECTED NOT DETECTED Final   Listeria monocytogenes NOT DETECTED NOT DETECTED Final   Staphylococcus species DETECTED (A) NOT DETECTED Final    Comment: CRITICAL RESULT CALLED TO, READ BACK BY AND VERIFIED WITH: PHARMD RACHEL S 1403 [id number] FCP    Staphylococcus aureus (BCID) DETECTED (A) NOT DETECTED Final    Comment: Methicillin (oxacillin)-resistant Staphylococcus aureus (MRSA). MRSA is predictably resistant to beta-lactam antibiotics (except ceftaroline). Preferred therapy is vancomycin unless clinically contraindicated. Patient requires contact precautions if  hospitalized. CRITICAL RESULT CALLED TO, READ BACK BY AND VERIFIED WITH: PHARMD RACHEL S 1403 [id number] FCP    Staphylococcus epidermidis NOT DETECTED NOT DETECTED Final   Staphylococcus lugdunensis NOT DETECTED NOT DETECTED Final   Streptococcus species NOT DETECTED NOT DETECTED Final   Streptococcus agalactiae NOT DETECTED NOT DETECTED Final   Streptococcus pneumoniae NOT DETECTED NOT DETECTED Final   Streptococcus pyogenes NOT DETECTED NOT DETECTED Final   A.calcoaceticus-baumannii NOT DETECTED NOT DETECTED Final   Bacteroides fragilis NOT DETECTED NOT DETECTED Final   Enterobacterales NOT DETECTED NOT DETECTED Final   Enterobacter cloacae complex NOT DETECTED NOT DETECTED Final   Escherichia coli NOT DETECTED NOT DETECTED Final   Klebsiella aerogenes NOT DETECTED NOT DETECTED Final   Klebsiella oxytoca NOT DETECTED NOT DETECTED Final   Klebsiella pneumoniae NOT DETECTED NOT DETECTED Final   Proteus species NOT DETECTED NOT DETECTED Final   Salmonella species NOT  DETECTED NOT DETECTED Final   Serratia marcescens NOT DETECTED NOT DETECTED Final   Haemophilus influenzae NOT DETECTED NOT DETECTED Final   Neisseria meningitidis NOT DETECTED NOT DETECTED Final   Pseudomonas aeruginosa NOT DETECTED NOT DETECTED Final   Stenotrophomonas maltophilia NOT DETECTED NOT DETECTED Final   Candida albicans NOT DETECTED NOT DETECTED Final   Candida auris NOT DETECTED NOT DETECTED Final   Candida glabrata NOT DETECTED NOT DETECTED Final   Candida krusei NOT DETECTED NOT DETECTED Final   Candida parapsilosis NOT DETECTED NOT DETECTED Final   Candida tropicalis NOT DETECTED NOT DETECTED Final   Cryptococcus neoformans/gattii NOT DETECTED NOT DETECTED Final   Meth resistant mecA/C and MREJ DETECTED (  A) NOT DETECTED Final    Comment: CRITICAL RESULT CALLED TO, READ BACK BY AND VERIFIED WITH: Gordan Payment [id number] FCP Performed at [facility], [address] [place], [place] [address]   Culture, blood (Routine X 2) w Reflex to ID Panel     Status: Abnormal   Collection Time: 02/10/22  6:53 AM   Specimen: BLOOD  Result Value Ref Range Status   Specimen Description BLOOD LEFT ANTECUBITAL  Final   Special Requests   Final    BOTTLES DRAWN AEROBIC AND ANAEROBIC Blood Culture adequate volume   Culture  Setup Time   Final    GRAM POSITIVE COCCI IN CLUSTERS IN BOTH AEROBIC AND ANAEROBIC BOTTLES CRITICAL RESULT CALLED TO, READ BACK BY AND VERIFIED WITH: PHARMD EDEN BREWINGTON ON 02/13/22 @ 1538 BY DRT    Culture (A)  Final    STAPHYLOCOCCUS AUREUS SUSCEPTIBILITIES PERFORMED ON PREVIOUS CULTURE WITHIN THE LAST 5 DAYS. Performed at [facility], [address], [place], [place] [address]    Report Status 02/15/2022 FINAL  Final  Surgical PCR screen     Status: Abnormal   Collection Time: 02/10/22  9:22 AM   Specimen: Nasal Mucosa; Nasal Swab  Result Value Ref Range Status   MRSA, PCR POSITIVE (A) NEGATIVE Final    Comment: RESULT CALLED TO, READ  BACK BY AND VERIFIED WITH: OLVIA MARTIN   DL    Staphylococcus aureus POSITIVE (A) NEGATIVE Final    Comment: (NOTE) The Xpert SA Assay (FDA approved for NASAL specimens in patients 79 years of age and older), is one component of a comprehensive surveillance program. It is not intended to diagnose infection nor to guide or monitor treatment. Performed at [facility], [address], [place], [place] [address]   Aerobic/Anaerobic Culture w Gram Stain (surgical/deep wound)     Status: None   Collection Time: 02/10/22 11:31 AM   Specimen: PATH Other; Tissue  Result Value Ref Range Status   Specimen Description WOUND  Final   Special Requests LEFT AC JOINT  Final   Gram Stain   Final    RARE WBC PRESENT,BOTH PMN AND MONONUCLEAR RARE GRAM POSITIVE COCCI IN CLUSTERS    Culture   Final    FEW METHICILLIN RESISTANT STAPHYLOCOCCUS AUREUS NO ANAEROBES ISOLATED Performed at [facility], [address], [place], [place] [address]    Report Status 02/15/2022 FINAL  Final   Organism ID, Bacteria METHICILLIN RESISTANT STAPHYLOCOCCUS AUREUS  Final      Susceptibility   Methicillin resistant staphylococcus aureus - MIC*    CIPROFLOXACIN >=8 RESISTANT Resistant     ERYTHROMYCIN >=8 RESISTANT Resistant     GENTAMICIN <=0.5 SENSITIVE Sensitive     OXACILLIN >=4 RESISTANT Resistant     TETRACYCLINE <=1 SENSITIVE Sensitive     VANCOMYCIN <=0.5 SENSITIVE Sensitive     TRIMETH/SULFA >=320 RESISTANT Resistant     CLINDAMYCIN <=0.25 SENSITIVE Sensitive     RIFAMPIN <=0.5 SENSITIVE Sensitive     Inducible Clindamycin NEGATIVE Sensitive     * FEW METHICILLIN RESISTANT STAPHYLOCOCCUS AUREUS  Culture, blood (Routine X 2) w Reflex to ID Panel     Status: None (Preliminary result)   Collection Time: 02/13/22  1:19 AM   Specimen: BLOOD LEFT HAND  Result Value Ref Range Status   Specimen Description BLOOD LEFT HAND  Final   Special Requests AEROBIC BOTTLE ONLY Blood Culture adequate  volume  Final  Culture   Final    NO GROWTH 2 DAYS Performed at [facility], [address] [place], [place] [address]    Report Status PENDING  Incomplete  Culture, blood (Routine X 2) w Reflex to ID Panel     Status: None (Preliminary result)   Collection Time: 02/13/22  1:38 AM   Specimen: BLOOD RIGHT HAND  Result Value Ref Range Status   Specimen Description BLOOD RIGHT HAND  Final   Special Requests   Final    BOTTLES DRAWN AEROBIC AND ANAEROBIC Blood Culture adequate volume   Culture   Final    NO GROWTH 2 DAYS Performed at [facility], [address] [place], [place] [address]    Report Status PENDING  Incomplete     US RENAL  Result Date: 02/15/2022 CLINICAL DATA:  Acute kidney injury. EXAM: RENAL / URINARY TRACT ULTRASOUND COMPLETE COMPARISON:  None Available. FINDINGS: Right Kidney: Renal measurements: 13.5 x 4.4 x 4.8 cm = volume: 148 mL. Increased echogenicity without hydronephrosis or mass lesion evident. Left Kidney: Renal measurements: 12.1 x 5.9 x 6.8 cm = volume: 253 mL. Echogenic parenchyma without mass lesion or hydronephrosis. Bladder: Appears normal for degree of bladder distention. Other: None. IMPRESSION: Echogenic kidneys bilaterally without hydronephrosis. Imaging features compatible with medical renal disease. Electronically Signed   By: Kennith Center M.D.   On: 02/15/2022 08:23    Family Communication: None at bedside  Disposition: Status is: Inpatient Remains inpatient appropriate because: Critically ill. Planned Discharge Destination:  TBD  Tyrone Nine, MD 02/16/2022 11:38 AM Page by [email or web address]

## 2022-02-17 ENCOUNTER — Inpatient Hospital Stay (HOSPITAL_COMMUNITY): Payer: Medicaid Other

## 2022-02-17 DIAGNOSIS — N179 Acute kidney failure, unspecified: Secondary | ICD-10-CM | POA: Diagnosis not present

## 2022-02-17 DIAGNOSIS — A419 Sepsis, unspecified organism: Secondary | ICD-10-CM | POA: Diagnosis not present

## 2022-02-17 DIAGNOSIS — R0781 Pleurodynia: Secondary | ICD-10-CM | POA: Diagnosis not present

## 2022-02-17 DIAGNOSIS — R7881 Bacteremia: Secondary | ICD-10-CM | POA: Diagnosis not present

## 2022-02-17 HISTORY — PX: IR FLUORO GUIDE CV LINE RIGHT: IMG2283

## 2022-02-17 HISTORY — PX: IR US GUIDE VASC ACCESS RIGHT: IMG2390

## 2022-02-17 LAB — CBC
HCT: 26.2 % — ABNORMAL LOW (ref 36.0–46.0)
HCT: 25.8 % — ABNORMAL LOW (ref 36.0–46.0)
Hemoglobin: 8.4 g/dL — ABNORMAL LOW (ref 12.0–15.0)
Hemoglobin: 8.6 g/dL — ABNORMAL LOW (ref 12.0–15.0)
MCH: 23.9 pg — ABNORMAL LOW (ref 26.0–34.0)
MCH: 24.2 pg — ABNORMAL LOW (ref 26.0–34.0)
MCHC: 32.1 g/dL (ref 30.0–36.0)
MCHC: 33.3 g/dL (ref 30.0–36.0)
MCV: 74.6 fL — ABNORMAL LOW (ref 80.0–100.0)
MCV: 72.5 fL — ABNORMAL LOW (ref 80.0–100.0)
Platelets: 491 10*3/uL — ABNORMAL HIGH (ref 150–400)
Platelets: 574 10*3/uL — ABNORMAL HIGH (ref 150–400)
RBC: 3.51 MIL/uL — ABNORMAL LOW (ref 3.87–5.11)
RBC: 3.56 MIL/uL — ABNORMAL LOW (ref 3.87–5.11)
RDW: 23.4 % — ABNORMAL HIGH (ref 11.5–15.5)
RDW: 23.2 % — ABNORMAL HIGH (ref 11.5–15.5)
WBC: 17.6 10*3/uL — ABNORMAL HIGH (ref 4.0–10.5)
WBC: 17.6 10*3/uL — ABNORMAL HIGH (ref 4.0–10.5)
nRBC: 0 % (ref 0.0–0.2)
nRBC: 0 % (ref 0.0–0.2)

## 2022-02-17 LAB — RENAL FUNCTION PANEL
Albumin: 2.1 g/dL — ABNORMAL LOW (ref 3.5–5.0)
Albumin: 1.9 g/dL — ABNORMAL LOW (ref 3.5–5.0)
Anion gap: 11 (ref 5–15)
Anion gap: 10 (ref 5–15)
BUN: 77 mg/dL — ABNORMAL HIGH (ref 6–20)
BUN: 80 mg/dL — ABNORMAL HIGH (ref 6–20)
CO2: 17 mmol/L — ABNORMAL LOW (ref 22–32)
CO2: 18 mmol/L — ABNORMAL LOW (ref 22–32)
Calcium: 7.6 mg/dL — ABNORMAL LOW (ref 8.9–10.3)
Calcium: 8 mg/dL — ABNORMAL LOW (ref 8.9–10.3)
Chloride: 110 mmol/L (ref 98–111)
Chloride: 107 mmol/L (ref 98–111)
Creatinine, Ser: 4.49 mg/dL — ABNORMAL HIGH (ref 0.44–1.00)
Creatinine, Ser: 4.7 mg/dL — ABNORMAL HIGH (ref 0.44–1.00)
GFR, Estimated: 13 mL/min — ABNORMAL LOW (ref >60)
GFR, Estimated: 12 mL/min — ABNORMAL LOW (ref >60)
Glucose, Bld: 91 mg/dL (ref 70–99)
Glucose, Bld: 87 mg/dL (ref 70–99)
Phosphorus: 10.2 mg/dL — ABNORMAL HIGH (ref 2.5–4.6)
Phosphorus: 10.5 mg/dL — ABNORMAL HIGH (ref 2.5–4.6)
Potassium: 6.5 mmol/L (ref 3.5–5.1)
Potassium: 5.9 mmol/L — ABNORMAL HIGH (ref 3.5–5.1)
Sodium: 138 mmol/L (ref 135–145)
Sodium: 135 mmol/L (ref 135–145)

## 2022-02-17 LAB — GLUCOSE, CAPILLARY
Glucose-Capillary: 121 mg/dL — ABNORMAL HIGH (ref 70–99)
Glucose-Capillary: 92 mg/dL (ref 70–99)
Glucose-Capillary: 92 mg/dL (ref 70–99)

## 2022-02-17 LAB — HEPATITIS B SURFACE ANTIGEN: Hepatitis B Surface Ag: NONREACTIVE

## 2022-02-17 LAB — HEPATITIS B SURFACE ANTIBODY,QUALITATIVE: Hep B S Ab: REACTIVE — AB

## 2022-02-17 LAB — HAPTOGLOBIN: Haptoglobin: 289 mg/dL — ABNORMAL HIGH (ref 33–278)

## 2022-02-17 LAB — C3 COMPLEMENT: C3 Complement: 37 mg/dL — ABNORMAL LOW (ref 82–167)

## 2022-02-17 LAB — C4 COMPLEMENT: Complement C4, Body Fluid: 12 mg/dL (ref 12–38)

## 2022-02-17 IMAGING — XA IR FLUORO GUIDE CV LINE*R*
2 series · 2 of 2 positions shown · non-contrast
Comparison: none

INDICATION: Need for dialysis access

[Series 1: ir fluoro guide cv line*right* · 1 of 1 slices shown]
[im 1/1]
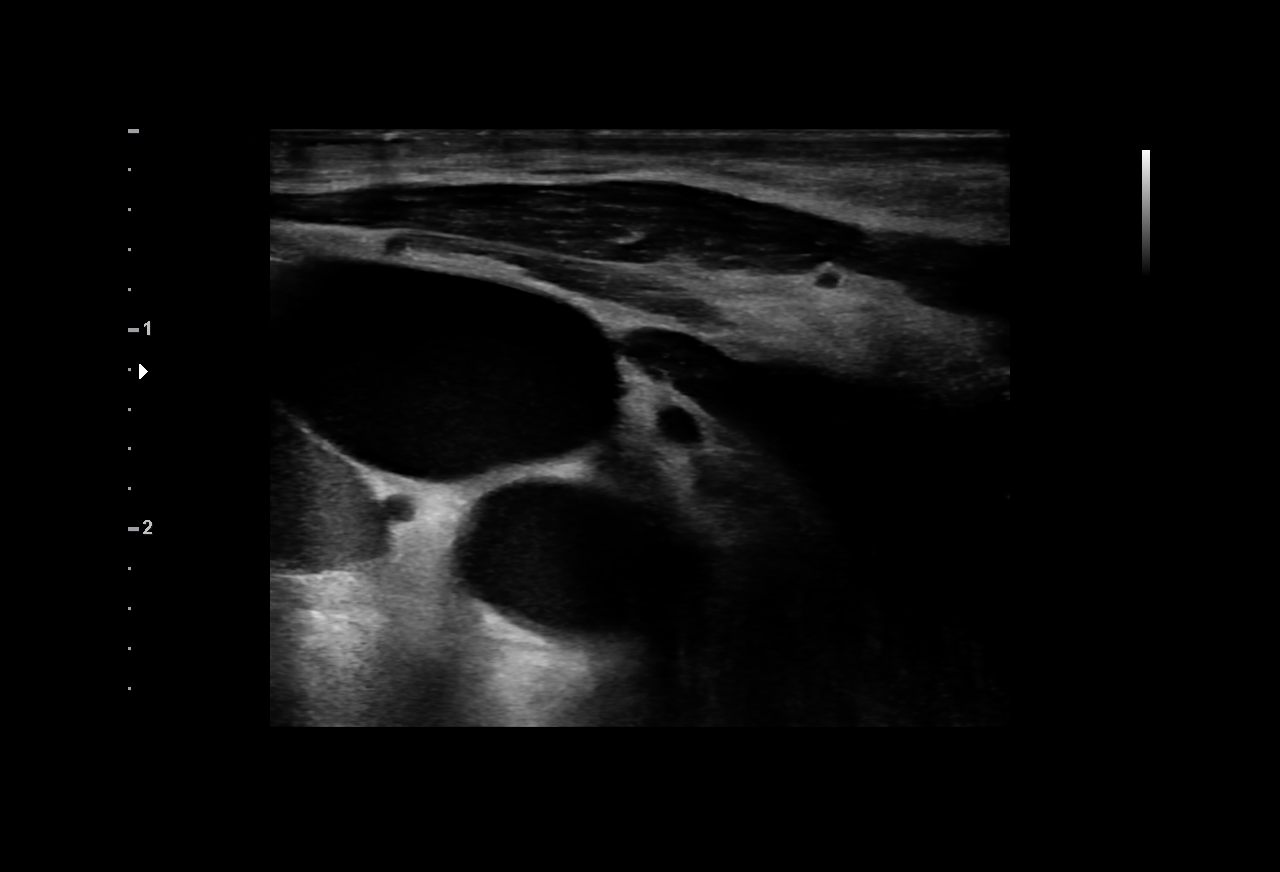

[Series 2: fl (-) angio · 1 of 1 slices shown]
[im 1/1]
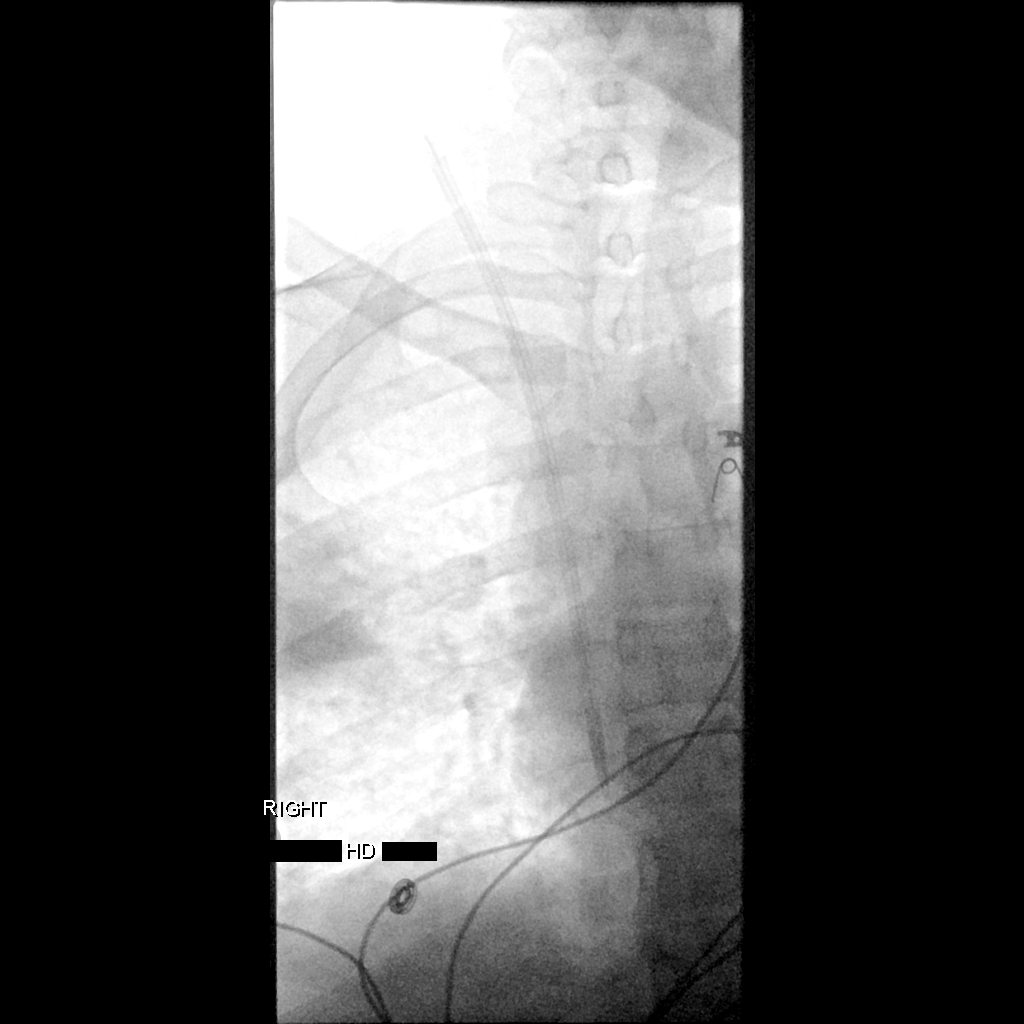

[2 of 2 positions shown; findings below may reference images not displayed]

EXAM:
1. Ultrasound-guided puncture of the right internal jugular vein
2. Placement of a temporary hemodialysis catheter using fluoroscopic
guidance

MEDICATIONS:
None

ANESTHESIA/SEDATION:
Local analgesia

FLUOROSCOPY TIME:  Fluoroscopy Time: 0.2 minutes (1 mGy)

COMPLICATIONS:
None immediate.

PROCEDURE:
Informed written consent was obtained from the patient after a
thorough discussion of the procedural risks, benefits and
alternatives. All questions were addressed. Maximal Sterile Barrier
Technique was utilized including caps, mask, sterile gowns, sterile
gloves, sterile drape, hand hygiene and skin antiseptic. A timeout
was performed prior to the initiation of the procedure.

The patient was placed supine on the exam table. The right neck and
chest was prepped and draped in the standard sterile fashion.
Ultrasound was used to evaluate the right internal jugular vein,
which was found to be widely patent. A permanent image was stored in
the electronic medical record. Using ultrasound guidance, the right
internal jugular vein was directly punctured using a 21 gauge
micropuncture set. Access site was serially dilated to accommodate
an 035 wire, which was advanced into the IVC. Subsequently, serial
tract dilation was performed and a 16 cm temporary triple-lumen
hemodialysis catheter was advanced into the central veins, such that
the tip overlies the superior cavoatrial junction. The catheter was
found to aspirate and flush appropriately. It was locked with the
appropriate volume of heparinized saline. It was secured to the skin
using silk suture and a sterile dressing. The patient tolerated the
procedure well without immediate complication.
IMPRESSION: Successful placement of a temporary triple-lumen hemodialysis
catheter via the right internal jugular vein using ultrasound and
fluoroscopic guidance. The line is ready for immediate use.

## 2022-02-17 MED ORDER — LIDOCAINE-PRILOCAINE 2.5-2.5 % EX CREA: 1.0000 "application " | TOPICAL_CREAM | CUTANEOUS | Status: DC | PRN

## 2022-02-17 MED ORDER — SODIUM POLYSTYRENE SULFONATE 15 GM/60ML PO SUSP
30.0000 g | Freq: Once | ORAL | Status: DC
  Filled 2022-02-17: qty 120

## 2022-02-17 MED ORDER — DEXTROSE 50 % IV SOLN
1.0000 | Freq: Once | INTRAVENOUS | Status: AC
  Administered 2022-02-17: 50 mL via INTRAVENOUS
  Filled 2022-02-17: qty 50

## 2022-02-17 MED ORDER — HEPARIN SODIUM (PORCINE) 1000 UNIT/ML IJ SOLN
INTRAMUSCULAR | Status: AC
  Filled 2022-02-17: qty 10

## 2022-02-17 MED ORDER — ALTEPLASE 2 MG IJ SOLR: 2.0000 mg | Freq: Once | INTRAMUSCULAR | Status: DC | PRN

## 2022-02-17 MED ORDER — INSULIN ASPART 100 UNIT/ML IV SOLN
5.0000 [IU] | Freq: Once | INTRAVENOUS | Status: AC
  Administered 2022-02-17: 5 [IU] via INTRAVENOUS

## 2022-02-17 MED ORDER — ONDANSETRON HCL 4 MG/2ML IJ SOLN
INTRAMUSCULAR | Status: AC
  Filled 2022-02-17: qty 2

## 2022-02-17 MED ORDER — SODIUM CHLORIDE 0.9 % IV SOLN: 100.0000 mL | INTRAVENOUS | Status: DC | PRN

## 2022-02-17 MED ORDER — PHENOL 1.4 % MT LIQD
1.0000 | OROMUCOSAL | Status: DC | PRN
  Administered 2022-02-18: 1 via OROMUCOSAL
  Filled 2022-02-17: qty 177

## 2022-02-17 MED ORDER — LIDOCAINE HCL (PF) 1 % IJ SOLN: 5.0000 mL | INTRAMUSCULAR | Status: DC | PRN

## 2022-02-17 MED ORDER — PENTAFLUOROPROP-TETRAFLUOROETH EX AERO: 1.0000 "application " | INHALATION_SPRAY | CUTANEOUS | Status: DC | PRN

## 2022-02-17 MED ORDER — CALCIUM GLUCONATE-NACL 2-0.675 GM/100ML-% IV SOLN
2.0000 g | Freq: Once | INTRAVENOUS | Status: AC
  Administered 2022-02-17: 2000 mg via INTRAVENOUS
  Filled 2022-02-17: qty 100

## 2022-02-17 MED ORDER — LIDOCAINE HCL 1 % IJ SOLN
INTRAMUSCULAR | Status: AC
  Filled 2022-02-17: qty 20

## 2022-02-17 MED ORDER — HEPARIN SODIUM (PORCINE) 1000 UNIT/ML DIALYSIS
1000.0000 [IU] | INTRAMUSCULAR | Status: DC | PRN
  Filled 2022-02-17: qty 1

## 2022-02-17 NOTE — Progress Notes (Signed)
Notified Dr.Kakrakandy per page about critical K+ level of 6.5 (yesterday was 5.8). Pt on Lokemia, to get tunnel HD cath today, and start HD

## 2022-02-17 NOTE — TOC Initial Note (Signed)
Transition of Care [facility]) - Initial/Assessment Note    Patient Details  Name: Carol Rivera MRN: [id number] Date of Birth: 24-Jun-1991  [facility] CM/SW Contact:    Cyndi Bender, RN Phone Number: 02/17/2022, 11:14 AM  Clinical Narrative:                 Patient admitted with septic shock due to MRSA bacteremia as well as large destructive TV endocarditis with TR. IV antibiotics were continued and tailored to cultures which were persistently positive until 5/25 which have remain NGTD. CT surgery stated the location (ventricular side) of the vegetations is not accessible by angiovac and she is not a candidate for valve replacement. IV daptomycin and ceftaroline have been continued per ID recommendations. Course complicated by left AC septic joint s/p I&D 5/22, anemia of acute illness s/p 1u PRBCs 5/27, and renal failure. HD catheter placed today.  TOC will continue to follow for needs   Expected Discharge Plan: Home/Self Care Barriers to Discharge: No Barriers Identified   Patient Goals and CMS Choice Patient states their goals for this hospitalization and ongoing recovery are:: to go back home CMS Medicare.gov Compare Post Acute Care list provided to:: Patient Choice offered to / list presented to : Patient  Expected Discharge Plan and Services Expected Discharge Plan: Home/Self Care   Discharge Planning Services: CM Consult   Living arrangements for the past 2 months: Single Family Home                                      Prior Living Arrangements/Services Living arrangements for the past 2 months: Single Family Home Lives with:: Self Patient language and need for interpreter reviewed:: Yes Do you feel safe going back to the place where you live?: Yes            Criminal Activity/Legal Involvement Pertinent to Current Situation/Hospitalization: No - Comment as needed  Activities of Daily Living Home Assistive Devices/Equipment: None ADL  Screening (condition at time of admission) Patient's cognitive ability adequate to safely complete daily activities?: No Is the patient deaf or have difficulty hearing?: No Does the patient have difficulty seeing, even when wearing glasses/contacts?: No Does the patient have difficulty concentrating, remembering, or making decisions?: No Patient able to express need for assistance with ADLs?: No Does the patient have difficulty dressing or bathing?: No Independently performs ADLs?: Yes (appropriate for developmental age) Does the patient have difficulty walking or climbing stairs?: No Weakness of Legs: None Weakness of Arms/Hands: None  Permission Sought/Granted                  Emotional Assessment Appearance:: Appears stated age Attitude/Demeanor/Rapport: Engaged Affect (typically observed): Calm Orientation: : Oriented to Place, Oriented to  Time, Oriented to Self, Oriented to Situation Alcohol / Substance Use: Not Applicable Psych Involvement: No (comment)  Admission diagnosis:  Sepsis ([place]) [A41.9] Sepsis with acute organ dysfunction and septic shock, due to unspecified organism, unspecified type ([place]) [A41.9, R65.21] Patient Active Problem List   Diagnosis Date Noted   Microcytic anemia 02/14/2022   Hypoalbuminemia 02/14/2022   AKI (acute kidney injury) ([place]) 02/14/2022   Septic arthritis of AC joint ([place])    Normocytic anemia 02/13/2022   Staphylococcal arthritis of left shoulder ([place])    Osteomyelitis ([place])    MRSA bacteremia    Septic pulmonary embolism without acute cor pulmonale ([place])  Endocarditis of tricuspid valve    Hyponatremia 02/07/2022   Elevated LFTs 02/07/2022   IVDU (intravenous drug user) 02/07/2022   Left shoulder pain 02/07/2022   Chest pain, pleuritic 02/07/2022   Sepsis ([place]) 02/06/2022   ASTHMA, PERSISTENT, MILD 07/04/2010   CHRONIC PHARYNGITIS 03/06/2010   SMOKER 02/23/2010   ALLERGIC RHINITIS 01/26/2010   UPPER RESPIRATORY INFECTION,  ACUTE, WITH BRONCHITIS 01/23/2010   ACUTE BRONCHOSPASM 01/23/2010   PCP:  Merryl Hacker No Pharmacy:   [facility] [address] ([address]), [place] [id number] [place] [address] Phone: [phone] Fax: [phone]     Social Determinants of Health (SDOH) Interventions    Readmission Risk Interventions     View : No data to display.

## 2022-02-17 NOTE — Progress Notes (Signed)
I called Dr. Toniann Fail to inform him the Hospitalist wrote the NPO order but 6 hours later the IR MD wrote in his note that pt didn't need to be NPO because he wasn't sedating her. Dr.Kakrakandy stated he doesn't want to chg the order or give her something by mouth incase they need to chg something and sedate her. He stated orders already written for tx for elevated K+

## 2022-02-17 NOTE — Procedures (Signed)
Interventional Radiology Procedure Note  Date of Procedure: 02/17/2022  Procedure: Temp HD catheter placement   Findings:  1. Placement of temp triple lumen HD catheter via right IJ    Complications: No immediate complications noted.   Estimated Blood Loss: minimal  Follow-up and Recommendations: 1. Ready for use    Olive Bass, MD  Vascular & Interventional Radiology  02/17/2022 9:24 AM

## 2022-02-17 NOTE — Progress Notes (Signed)
I went into rm and woke pt up and told her what meds were ordered for her high K+ level. I explained 1 was an enema. Pt asked "does that go up my butt?" I told her "yes." Pt stated "no."  She then went back to sleep. Pt given the amp of D50 IV, 5 units of Novolog Insulin IV, and the Ca++ Gluconate IV was started.

## 2022-02-17 NOTE — Progress Notes (Signed)
Dr Hal Hope called and stated he was going to order pt Carol Rivera for high K+. Informed pt was NPO for Tunnel Cath later today. MD to call pharmacy and see what he can give the pt for the K+

## 2022-02-17 NOTE — Progress Notes (Signed)
Progress Note  Patient: LAVAYAH VITA ZOX:[id number] DOB: 03-19-1991  DOA: 02/06/2022  DOS: 02/17/2022    Brief hospital course: Carol Rivera is a 31 y.o. female with a history of IVDU who presented 5/18 with fever, cough, chest pain and left shoulder pain. She was found to have septic shock due to MRSA bacteremia as well as large destructive TV endocarditis with TR. IV antibiotics were continued and tailored to cultures which were persistently positive until 5/25 which have remain NGTD. CT surgery stated the location (ventricular side) of the vegetations is not accessible by angiovac and she is not a candidate for valve replacement. IV daptomycin and ceftaroline have been continued per ID recommendations. Course complicated by left AC septic joint s/p I&D 5/22, anemia of acute illness s/p 1u PRBCs 5/27, and renal failure with anasarca for which dialysis will be initiated through right IJ TDC placed 02/17/2022.   Assessment and Plan: MRSA bacteremia with multifocal metastatic infections, tricuspid valve endocarditis with TR: CT surgery eval 5/22 not accessible via angiovac, and not a candidate for valve replacement given recent IV drug abuse and valve destruction. Blood cultures were persistently positive, though 5/25 set is NGTD.  - Daptomycin, teflaro per ID. Dosing per pharmacy. CK serially checked, remains low.  Anemia of acute illness:  - H/H stable s/p 1u PRBCs 5/27. No active bleeding, Coombs negative.  Acute renal failure: With hyperkalemia, metabolic acidosis, declining urine output, suspect she may require HD for which nephrology has consulted IR for access. Renal U/S with increased echogenicity, no hydro. - IR placed right IJ [facility] 5/29, ready for immediate use. Appreciate nephrology assistance in initiating hemodialysis.  - Monitor UOP. - C3, C4 still pending, though has active findings on UA.  Hyperkalemia:  - Repeating lasix, given lokelma high dose, refused enema. Given  calcium gluconate dextrose/insulin. Needs dialysis.  - Intervals and T wave morphology stable on cardiac monitoring.  Wheezing: Resolved. - Continue albuterol prn  T11 osteomyelitis: demonstrating T2-hyperintensity, suspect new osteomyelitis. Dr. Maurice Small, neurosurgery evaluated the patient on 5/26, recommending continuation of antibiotics without acute neurosurgical intervention. +clonus on L > R so could consider repeat MR brain given her risk of septic cerebral emboli.  - Continue oxycodone prn moderate and hydromorphone prn severe pain. Will likely consolidate into hydromorphone po only in the coming days given renal failure and ability to take po/risk for polypharmacy.  Left AC septic arthritis: s/p I&D 5/22.  - Wound care per orthopedics.  - Per Dr. August Saucer, plan is for open distal clavicle excision and debridement of AC joint.  IVDU:  - Cessation counseling provided.   Acute metabolic encephalopathy: Improved from admission.  - Continue supportive seroquel and clonazepam.   Obesity: Estimated body mass index is 31.82 kg/m as calculated from the following:   Height as of this encounter: 5' (1.524 m).   Weight as of this encounter: 73.9 kg.  Subjective: Hungry, upset she wasn't allowed to eat. No new complaints.   Objective: Vitals:   02/17/22 0235 02/17/22 0310 02/17/22 0730 02/17/22 1130  BP:  125/87 131/89   Pulse:  83    Resp:  20    Temp:  97.8 F (36.6 C) 97.6 F (36.4 C) 98 F (36.7 C)  TempSrc:  Axillary Axillary Oral  SpO2:  94%    Weight: 73.9 kg     Height:       Gen: 31 y.o. female in no distress Pulm: Nonlabored breathing room air. Clear. CV: Regular rate and rhythm.  stable systolic murmur at LSB, no rub, or gallop. No JVD, diffuse edema. GI: Abdomen soft, non-tender, non-distended, with normoactive bowel sounds.  Ext: Warm, no deformities Skin: No new rashes, lesions or ulcers on visualized skin. Left shoulder dressing c/d/i. Right IJ catheter site  c/d/i Neuro: Alert and oriented. No focal neurological deficits. Psych: Judgement and insight appear fair. Mood euthymic & affect congruent. Behavior is appropriate.    Data Personally reviewed: CBC: Recent Labs  Lab 02/14/22 0551 02/15/22 0446 02/15/22 1730 02/16/22 0509 02/17/22 0055 02/17/22 0702  WBC 26.8* 20.8*  --  17.6* 17.6* 17.6*  HGB 7.3* 6.7* 9.5* 7.7* 8.4* 8.6*  HCT 22.5* 20.1* 28.5* 23.5* 26.2* 25.8*  MCV 73.3* 72.0*  --  72.8* 74.6* 72.5*  PLT 358 389  --  410* 491* 574*   Basic Metabolic Panel: Recent Labs  Lab 02/11/22 0618 02/12/22 0224 02/13/22 0119 02/14/22 0551 02/15/22 0446 02/16/22 0509 02/16/22 1601 02/17/22 0055 02/17/22 0702  NA 137 136 136 136 137 136 136 138 135  K 3.4* 3.6 3.7 4.4 5.1 5.8* 6.2* 6.5* 5.9*  CL 110 109 110 108 111 109 107 110 107  CO2 19* 22 20* 21* 19* 19* 18* 17* 18*  GLUCOSE 86 120* 108* 101* 102* 94 86 91 87  BUN 18 24* 24* 37* 53* 66* 71* 77* 80*  CREATININE 0.70 0.98 1.12* 2.06* 3.05* 3.92* 4.52* 4.49* 4.70*  CALCIUM 7.1* 6.9* 6.9* 7.1* 7.2* 7.2* 7.5* 7.6* 8.0*  MG 2.1 2.1 2.1 2.0 2.1  --   --   --   --   PHOS  --   --   --   --  7.1* 9.0* 9.9* 10.2* 10.5*   GFR: Estimated Creatinine Clearance: 15.6 mL/min (A) (by C-G formula based on SCr of 4.7 mg/dL (H)). Liver Function Tests: Recent Labs  Lab 02/11/22 0618 02/12/22 0224 02/13/22 0119 02/14/22 0551 02/15/22 0446 02/16/22 0509 02/16/22 1601 02/17/22 0055 02/17/22 0702  AST 43* 34 27 23  --  18  --   --   --   ALT 46* 36 28 22  --  16  --   --   --   ALKPHOS 105 107 102 101  --  78  --   --   --   BILITOT 4.8* 2.0* 1.4* 1.4*  --  1.5*  --   --   --   PROT 5.2* 5.1* 5.1* 5.3*  --  5.7*  --   --   --   ALBUMIN 1.6* <1.5* <1.5* <1.5* <1.5* 1.9*  1.9* 2.2* 2.1* 1.9*   No results for input(s): LIPASE, AMYLASE in the last 168 hours. No results for input(s): AMMONIA in the last 168 hours. Coagulation Profile: No results for input(s): INR, PROTIME in the last  168 hours. Cardiac Enzymes: Recent Labs  Lab 02/16/22 0509  CKTOTAL 23*   BNP (last 3 results) No results for input(s): PROBNP in the last 8760 hours. HbA1C: No results for input(s): HGBA1C in the last 72 hours. CBG: Recent Labs  Lab 02/16/22 1144 02/16/22 1538 02/16/22 2100 02/17/22 0729 02/17/22 1131  GLUCAP 93 84 78 121* 92   Lipid Profile: No results for input(s): CHOL, HDL, LDLCALC, TRIG, CHOLHDL, LDLDIRECT in the last 72 hours. Thyroid Function Tests: No results for input(s): TSH, T4TOTAL, FREET4, T3FREE, THYROIDAB in the last 72 hours. Anemia Panel: No results for input(s): VITAMINB12, FOLATE, FERRITIN, TIBC, IRON, RETICCTPCT in the last 72 hours. Urine analysis:    Component Value  Date/Time   COLORURINE AMBER (A) 02/14/2022 1751   APPEARANCEUR CLOUDY (A) 02/14/2022 1751   LABSPEC 1.014 02/14/2022 1751   PHURINE 5.0 02/14/2022 1751   GLUCOSEU NEGATIVE 02/14/2022 1751   HGBUR LARGE (A) 02/14/2022 1751   BILIRUBINUR NEGATIVE 02/14/2022 1751   KETONESUR NEGATIVE 02/14/2022 1751   PROTEINUR 100 (A) 02/14/2022 1751   UROBILINOGEN 0.2 02/24/2012 2301   NITRITE NEGATIVE 02/14/2022 1751   LEUKOCYTESUR SMALL (A) 02/14/2022 1751   Recent Results (from the past 240 hour(s))  Culture, blood (Routine X 2) w Reflex to ID Panel     Status: Abnormal   Collection Time: 02/08/22  2:58 AM   Specimen: BLOOD  Result Value Ref Range Status   Specimen Description   Final    BLOOD BLOOD LEFT HAND Performed at [facility], [address] [place], [place] [address]    Special Requests   Final    IN PEDIATRIC BOTTLE Blood Culture adequate volume Performed at [facility], [address] [place], [place] [address]    Culture  Setup Time   Final    GRAM POSITIVE COCCI IN CLUSTERS AEROBIC BOTTLE ONLY CRITICAL VALUE NOTED.  VALUE IS CONSISTENT WITH PREVIOUSLY REPORTED AND CALLED VALUE.    Culture (A)  Final    STAPHYLOCOCCUS  AUREUS SUSCEPTIBILITIES PERFORMED ON PREVIOUS CULTURE WITHIN THE LAST 5 DAYS. Performed at [facility], [address] [place], [place] [address]    Report Status 02/10/2022 FINAL  Final  Culture, blood (Routine X 2) w Reflex to ID Panel     Status: Abnormal (Preliminary result)   Collection Time: 02/08/22  2:59 AM   Specimen: BLOOD  Result Value Ref Range Status   Specimen Description   Final    BLOOD RIGHT ANTECUBITAL Performed at [facility], [address], [place], [place] [address]    Special Requests   Final    IN PEDIATRIC BOTTLE Blood Culture adequate volume Performed at [facility], [address], [place], [place] [address]    Culture  Setup Time   Final    GRAM POSITIVE COCCI IN CLUSTERS AEROBIC BOTTLE ONLY CRITICAL VALUE NOTED.  VALUE IS CONSISTENT WITH PREVIOUSLY REPORTED AND CALLED VALUE.    Culture (A)  Final    STAPHYLOCOCCUS AUREUS Sent to [facility] for further susceptibility testing. Performed at [facility], [address], [place], [place] [address]    Report Status PENDING  Incomplete  Min Inhibitory Conc (2 Drugs)     Status: None   Collection Time: 02/08/22  2:59 AM  Result Value Ref Range Status   Min Inhibitory Conc (2 Drugs) Final report  Corrected    Comment: (NOTE) Performed At: [facility] [address] [place], [place] [id number] Jolene Schimke MD WN:[id number] CORRECTED ON 05/27 AT 0936: PREVIOUSLY REPORTED AS Preliminary report    Source (MIC2) BLOOD  Final    Comment: Performed at [facility], [address] [place], [place] [address]  MIC Results (2 Drugs)     Status: None   Collection Time: 02/08/22  2:59 AM  Result Value Ref Range Status   RESULT 5 MIC (RESULT 1)                Base Name: Staphylococcus aureus  Final    Comment: (NOTE) Identification performed by account, not confirmed by this laboratory. CEFTAROLINE     <=1 UG/ML =  SUSCEPTIBLE DAPTOMYCIN      <=  1 UG/ML = SUSCEPTIBLE Performed At: [facility] [address] [place], [place] [id number] Jolene Schimke MD YN:[id number]   Culture, blood (Routine X 2) w Reflex to ID Panel     Status: Abnormal   Collection Time: 02/10/22  6:43 AM   Specimen: BLOOD  Result Value Ref Range Status   Specimen Description BLOOD LEFT ANTECUBITAL  Final   Special Requests   Final    BOTTLES DRAWN AEROBIC AND ANAEROBIC Blood Culture adequate volume   Culture  Setup Time   Final    GRAM POSITIVE COCCI IN CLUSTERS IN BOTH AEROBIC AND ANAEROBIC BOTTLES CRITICAL RESULT CALLED TO, READ BACK BY AND VERIFIED WITH: Tandy Gaw [id number] FCP Performed at [facility], [address], [place], [place] [address]    Culture METHICILLIN RESISTANT STAPHYLOCOCCUS AUREUS (A)  Final   Report Status 02/14/2022 FINAL  Final   Organism ID, Bacteria METHICILLIN RESISTANT STAPHYLOCOCCUS AUREUS  Final      Susceptibility   Methicillin resistant staphylococcus aureus - MIC*    CIPROFLOXACIN >=8 RESISTANT Resistant     ERYTHROMYCIN >=8 RESISTANT Resistant     GENTAMICIN <=0.5 SENSITIVE Sensitive     OXACILLIN >=4 RESISTANT Resistant     TETRACYCLINE <=1 SENSITIVE Sensitive     VANCOMYCIN 1 SENSITIVE Sensitive     TRIMETH/SULFA >=320 RESISTANT Resistant     CLINDAMYCIN <=0.25 SENSITIVE Sensitive     RIFAMPIN <=0.5 SENSITIVE Sensitive     Inducible Clindamycin NEGATIVE Sensitive     * METHICILLIN RESISTANT STAPHYLOCOCCUS AUREUS  Blood Culture ID Panel (Reflexed)     Status: Abnormal   Collection Time: 02/10/22  6:43 AM  Result Value Ref Range Status   Enterococcus faecalis NOT DETECTED NOT DETECTED Final   Enterococcus Faecium NOT DETECTED NOT DETECTED Final   Listeria monocytogenes NOT DETECTED NOT DETECTED Final   Staphylococcus species DETECTED (A) NOT DETECTED Final    Comment: CRITICAL RESULT CALLED TO, READ BACK BY AND VERIFIED WITH: PHARMD RACHEL S [id number] [id number]  FCP    Staphylococcus aureus (BCID) DETECTED (A) NOT DETECTED Final    Comment: Methicillin (oxacillin)-resistant Staphylococcus aureus (MRSA). MRSA is predictably resistant to beta-lactam antibiotics (except ceftaroline). Preferred therapy is vancomycin unless clinically contraindicated. Patient requires contact precautions if  hospitalized. CRITICAL RESULT CALLED TO, READ BACK BY AND VERIFIED WITH: PHARMD RACHEL S 1403 [id number] FCP    Staphylococcus epidermidis NOT DETECTED NOT DETECTED Final   Staphylococcus lugdunensis NOT DETECTED NOT DETECTED Final   Streptococcus species NOT DETECTED NOT DETECTED Final   Streptococcus agalactiae NOT DETECTED NOT DETECTED Final   Streptococcus pneumoniae NOT DETECTED NOT DETECTED Final   Streptococcus pyogenes NOT DETECTED NOT DETECTED Final   A.calcoaceticus-baumannii NOT DETECTED NOT DETECTED Final   Bacteroides fragilis NOT DETECTED NOT DETECTED Final   Enterobacterales NOT DETECTED NOT DETECTED Final   Enterobacter cloacae complex NOT DETECTED NOT DETECTED Final   Escherichia coli NOT DETECTED NOT DETECTED Final   Klebsiella aerogenes NOT DETECTED NOT DETECTED Final   Klebsiella oxytoca NOT DETECTED NOT DETECTED Final   Klebsiella pneumoniae NOT DETECTED NOT DETECTED Final   Proteus species NOT DETECTED NOT DETECTED Final   Salmonella species NOT DETECTED NOT DETECTED Final   Serratia marcescens NOT DETECTED NOT DETECTED Final   Haemophilus influenzae NOT DETECTED NOT DETECTED Final   Neisseria meningitidis NOT DETECTED NOT DETECTED Final   Pseudomonas aeruginosa NOT DETECTED NOT DETECTED Final   Stenotrophomonas maltophilia NOT DETECTED NOT DETECTED Final  Candida albicans NOT DETECTED NOT DETECTED Final   Candida auris NOT DETECTED NOT DETECTED Final   Candida glabrata NOT DETECTED NOT DETECTED Final   Candida krusei NOT DETECTED NOT DETECTED Final   Candida parapsilosis NOT DETECTED NOT DETECTED Final   Candida tropicalis NOT DETECTED  NOT DETECTED Final   Cryptococcus neoformans/gattii NOT DETECTED NOT DETECTED Final   Meth resistant mecA/C and MREJ DETECTED (A) NOT DETECTED Final    Comment: CRITICAL RESULT CALLED TO, READ BACK BY AND VERIFIED WITH: Gordan PaymentHARM RACHEL S 1403 [phone] FCP Performed at [facility], [address] [place], [place] [address]   Culture, blood (Routine X 2) w Reflex to ID Panel     Status: Abnormal   Collection Time: 02/10/22  6:53 AM   Specimen: BLOOD  Result Value Ref Range Status   Specimen Description BLOOD LEFT ANTECUBITAL  Final   Special Requests   Final    BOTTLES DRAWN AEROBIC AND ANAEROBIC Blood Culture adequate volume   Culture  Setup Time   Final    GRAM POSITIVE COCCI IN CLUSTERS IN BOTH AEROBIC AND ANAEROBIC BOTTLES CRITICAL RESULT CALLED TO, READ BACK BY AND VERIFIED WITH: PHARMD EDEN BREWINGTON ON 02/13/22 @ 1538 BY DRT    Culture (A)  Final    STAPHYLOCOCCUS AUREUS SUSCEPTIBILITIES PERFORMED ON PREVIOUS CULTURE WITHIN THE LAST 5 DAYS. Performed at [facility], [address] [place], [place] [address]    Report Status 02/15/2022 FINAL  Final  Surgical PCR screen     Status: Abnormal   Collection Time: 02/10/22  9:22 AM   Specimen: Nasal Mucosa; Nasal Swab  Result Value Ref Range Status   MRSA, PCR POSITIVE (A) NEGATIVE Final    Comment: RESULT CALLED TO, READ BACK BY AND VERIFIED WITH: OLVIA MARTIN @1503   DL    Staphylococcus aureus POSITIVE (A) NEGATIVE Final    Comment: (NOTE) The Xpert SA Assay (FDA approved for NASAL specimens in patients 32 years of age and older), is one component of a comprehensive surveillance program. It is not intended to diagnose infection nor to guide or monitor treatment. Performed at [facility], [address] [place], [place] [address]   Aerobic/Anaerobic Culture w Gram Stain (surgical/deep wound)     Status: None   Collection Time: 02/10/22 11:31 AM   Specimen: PATH Other; Tissue  Result Value Ref Range  Status   Specimen Description WOUND  Final   Special Requests LEFT AC JOINT  Final   Gram Stain   Final    RARE WBC PRESENT,BOTH PMN AND MONONUCLEAR RARE GRAM POSITIVE COCCI IN CLUSTERS    Culture   Final    FEW METHICILLIN RESISTANT STAPHYLOCOCCUS AUREUS NO ANAEROBES ISOLATED Performed at [facility], [address] [place], [place] [address]    Report Status 02/15/2022 FINAL  Final   Organism ID, Bacteria METHICILLIN RESISTANT STAPHYLOCOCCUS AUREUS  Final      Susceptibility   Methicillin resistant staphylococcus aureus - MIC*    CIPROFLOXACIN >=8 RESISTANT Resistant     ERYTHROMYCIN >=8 RESISTANT Resistant     GENTAMICIN <=0.5 SENSITIVE Sensitive     OXACILLIN >=4 RESISTANT Resistant     TETRACYCLINE <=1 SENSITIVE Sensitive     VANCOMYCIN <=0.5 SENSITIVE Sensitive     TRIMETH/SULFA >=320 RESISTANT Resistant     CLINDAMYCIN <=0.25 SENSITIVE Sensitive     RIFAMPIN <=0.5 SENSITIVE Sensitive     Inducible Clindamycin NEGATIVE Sensitive     *  FEW METHICILLIN RESISTANT STAPHYLOCOCCUS AUREUS  Culture, blood (Routine X 2) w Reflex to ID Panel     Status: None (Preliminary result)   Collection Time: 02/13/22  1:19 AM   Specimen: BLOOD LEFT HAND  Result Value Ref Range Status   Specimen Description BLOOD LEFT HAND  Final   Special Requests AEROBIC BOTTLE ONLY Blood Culture adequate volume  Final   Culture   Final    NO GROWTH 4 DAYS Performed at [facility], [address], [place], [place] [address]    Report Status PENDING  Incomplete  Culture, blood (Routine X 2) w Reflex to ID Panel     Status: None (Preliminary result)   Collection Time: 02/13/22  1:38 AM   Specimen: BLOOD RIGHT HAND  Result Value Ref Range Status   Specimen Description BLOOD RIGHT HAND  Final   Special Requests   Final    BOTTLES DRAWN AEROBIC AND ANAEROBIC Blood Culture adequate volume   Culture   Final    NO GROWTH 4 DAYS Performed at [facility], [address], [place],  [place] [address]    Report Status PENDING  Incomplete     IR Fluoro Guide CV Line Right  Result Date: 02/17/2022 INDICATION: Need for dialysis access EXAM: 1. Ultrasound-guided puncture of the right internal jugular vein 2. Placement of a temporary hemodialysis catheter using fluoroscopic guidance MEDICATIONS: None ANESTHESIA/SEDATION: Local analgesia FLUOROSCOPY TIME:  Fluoroscopy Time: 0.2 minutes (1 mGy) COMPLICATIONS: None immediate. PROCEDURE: Informed written consent was obtained from the patient after a thorough discussion of the procedural risks, benefits and alternatives. All questions were addressed. Maximal Sterile Barrier Technique was utilized including caps, mask, sterile gowns, sterile gloves, sterile drape, hand hygiene and skin antiseptic. A timeout was performed prior to the initiation of the procedure. The patient was placed supine on the exam table. The right neck and chest was prepped and draped in the standard sterile fashion. Ultrasound was used to evaluate the right internal jugular vein, which was found to be widely patent. A permanent image was stored in the electronic medical record. Using ultrasound guidance, the right internal jugular vein was directly punctured using a 21 gauge micropuncture set. Access site was serially dilated to accommodate an 035 wire, which was advanced into the IVC. Subsequently, serial tract dilation was performed and a 16 cm temporary triple-lumen hemodialysis catheter was advanced into the central veins, such that the tip overlies the superior cavoatrial junction. The catheter was found to aspirate and flush appropriately. It was locked with the appropriate volume of heparinized saline. It was secured to the skin using silk suture and a sterile dressing. The patient tolerated the procedure well without immediate complication. IMPRESSION: Successful placement of a temporary triple-lumen hemodialysis catheter via the right internal jugular vein using ultrasound and  fluoroscopic guidance. The line is ready for immediate use. Electronically Signed   By: Olive Bass M.D.   On: 02/17/2022 09:28   IR US Guide Vasc Access Right  Result Date: 02/17/2022 INDICATION: Need for dialysis access EXAM: 1. Ultrasound-guided puncture of the right internal jugular vein 2. Placement of a temporary hemodialysis catheter using fluoroscopic guidance MEDICATIONS: None ANESTHESIA/SEDATION: Local analgesia FLUOROSCOPY TIME:  Fluoroscopy Time: 0.2 minutes (1 mGy) COMPLICATIONS: None immediate. PROCEDURE: Informed written consent was obtained from the patient after a thorough discussion of the procedural risks, benefits and alternatives. All questions were addressed. Maximal Sterile Barrier Technique was utilized including caps, mask, sterile gowns, sterile gloves, sterile  drape, hand hygiene and skin antiseptic. A timeout was performed prior to the initiation of the procedure. The patient was placed supine on the exam table. The right neck and chest was prepped and draped in the standard sterile fashion. Ultrasound was used to evaluate the right internal jugular vein, which was found to be widely patent. A permanent image was stored in the electronic medical record. Using ultrasound guidance, the right internal jugular vein was directly punctured using a 21 gauge micropuncture set. Access site was serially dilated to accommodate an 035 wire, which was advanced into the IVC. Subsequently, serial tract dilation was performed and a 16 cm temporary triple-lumen hemodialysis catheter was advanced into the central veins, such that the tip overlies the superior cavoatrial junction. The catheter was found to aspirate and flush appropriately. It was locked with the appropriate volume of heparinized saline. It was secured to the skin using silk suture and a sterile dressing. The patient tolerated the procedure well without immediate complication. IMPRESSION: Successful placement of a temporary  triple-lumen hemodialysis catheter via the right internal jugular vein using ultrasound and fluoroscopic guidance. The line is ready for immediate use. Electronically Signed   By: Olive Bass M.D.   On: 02/17/2022 09:28    Family Communication: None at bedside  Disposition: Status is: Inpatient Remains inpatient appropriate because: Critically ill. Planned Discharge Destination:  TBD  Tyrone Nine, MD 02/17/2022 11:57 AM Page by [email or web address]

## 2022-02-17 NOTE — Progress Notes (Signed)
Patient ID: Carol Rivera, female   DOB: 1990/12/02, 31 y.o.   MRN: [id number] [facility] Progress Note   Assessment/ Plan:   1.  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  Awaiting complement levels.   -starting HD given worsening kidney function, borderline UOP, persistent hyperkalemia. S/p RIJ temp line 5/29 with IR -#1 treatment today, planning on 2nd treatment tomorrow (slow start protocol) 2.  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  On daptomycin and ceftaroline. 3.  Anemia: Likely secondary to acute/critical illness.  With continued downtrend of hemoglobin/hematocrit and mild hyperkalemia, would be prudent to evaluate for Teflaro associated drug-induced auto hemolytic anemia.  Elevated LDH with negative direct Coombs test and pending haptoglobin. Transfuse prn 4.  Anasarca/hypoalbuminemia: with significant proteinruia. At this time, would not do a biopsy given her acute illness and diffuse pain. Push protein. Will UF as tolerated 5.  Hyperkalemia: Secondary to acute kidney injury, starting HD  Subjective:   S/p RIJ temp line placement today. Apparently boyfriend is on the phone, requesting transfer to [facility]? Floor staff aware. Patient reports ongoing diffuse pain (chronic issue) requesting pain meds   Objective:   BP 131/89 (BP Location: Left Leg)   Pulse 83   Temp 98 F (36.7 C) (Oral)   Resp 20   Ht 5' (1.524 m)   Wt 73.9 kg   SpO2 94%   BMI 31.82 kg/m   Intake/Output Summary (Last 24 hours) at 02/17/2022 1226 Last data filed at 02/17/2022 1054 Gross per 24 hour  Intake 405 ml  Output 400 ml  Net 5 ml    Weight change: -0.6 kg  Physical Exam: Gen: anxious appearing, laying flat in bed CVS: Pulse regular rhythm, normal rate, 3/6 holosystolic murmur Resp: Coarse breath sounds bilaterally diminished over right base Abd: Soft, flat, nontender, bowel sounds normal Ext: Trace lower extremity/upper extremity edema Dialysis access: RIJ temp line  Imaging: IR Fluoro Guide CV Line Right  Result Date: 02/17/2022 INDICATION: Need for dialysis access EXAM: 1. Ultrasound-guided puncture of the right internal jugular vein 2. Placement of a temporary hemodialysis catheter using fluoroscopic guidance MEDICATIONS: None ANESTHESIA/SEDATION: Local analgesia FLUOROSCOPY TIME:  Fluoroscopy Time: 0.2 minutes (1 mGy) COMPLICATIONS: None immediate. PROCEDURE: Informed written consent was obtained from the patient after a thorough discussion of the procedural risks, benefits and alternatives. All questions were addressed. Maximal Sterile Barrier Technique was utilized including caps, mask, sterile gowns, sterile gloves, sterile drape, hand hygiene and skin antiseptic. A timeout was performed prior to the initiation of the procedure. The patient was placed supine on the exam table. The right neck and chest was prepped and draped in the standard sterile fashion. Ultrasound was used to evaluate the right internal jugular vein, which was found to be widely patent. A permanent image was stored in the electronic medical record. Using ultrasound guidance, the right internal jugular vein was directly punctured using a 21 gauge micropuncture set. Access site was serially dilated to accommodate an 035 wire, which was advanced into the IVC. Subsequently, serial tract dilation was performed and a 16 cm temporary triple-lumen hemodialysis catheter was advanced into the central veins, such that the tip overlies the superior cavoatrial junction. The catheter was  found to aspirate and flush appropriately. It was locked with the appropriate  volume of heparinized saline. It was secured to the skin using silk suture and a sterile dressing. The patient tolerated the procedure well without immediate complication. IMPRESSION: Successful placement of a temporary triple-lumen hemodialysis catheter via the right internal jugular vein using ultrasound and fluoroscopic guidance. The line is ready for immediate use. Electronically Signed   By: Olive BassYasser  El-Abd M.D.   On: 02/17/2022 09:28   IR US Guide Vasc Access Right  Result Date: 02/17/2022 INDICATION: Need for dialysis access EXAM: 1. Ultrasound-guided puncture of the right internal jugular vein 2. Placement of a temporary hemodialysis catheter using fluoroscopic guidance MEDICATIONS: None ANESTHESIA/SEDATION: Local analgesia FLUOROSCOPY TIME:  Fluoroscopy Time: 0.2 minutes (1 mGy) COMPLICATIONS: None immediate. PROCEDURE: Informed written consent was obtained from the patient after a thorough discussion of the procedural risks, benefits and alternatives. All questions were addressed. Maximal Sterile Barrier Technique was utilized including caps, mask, sterile gowns, sterile gloves, sterile drape, hand hygiene and skin antiseptic. A timeout was performed prior to the initiation of the procedure. The patient was placed supine on the exam table. The right neck and chest was prepped and draped in the standard sterile fashion. Ultrasound was used to evaluate the right internal jugular vein, which was found to be widely patent. A permanent image was stored in the electronic medical record. Using ultrasound guidance, the right internal jugular vein was directly punctured using a 21 gauge micropuncture set. Access site was serially dilated to accommodate an 035 wire, which was advanced into the IVC. Subsequently, serial tract dilation was performed and a 16 cm temporary triple-lumen hemodialysis catheter was advanced into the central veins, such that the tip overlies the superior cavoatrial junction. The catheter  was found to aspirate and flush appropriately. It was locked with the appropriate volume of heparinized saline. It was secured to the skin using silk suture and a sterile dressing. The patient tolerated the procedure well without immediate complication. IMPRESSION: Successful placement of a temporary triple-lumen hemodialysis catheter via the right internal jugular vein using ultrasound and fluoroscopic guidance. The line is ready for immediate use. Electronically Signed   By: Olive BassYasser  El-Abd M.D.   On: 02/17/2022 09:28    Labs: BMET Recent Labs  Lab 02/13/22 0119 02/14/22 0551 02/15/22 0446 02/16/22 0509 02/16/22 1601 02/17/22 0055 02/17/22 0702  NA 136 136 137 136 136 138 135  K 3.7 4.4 5.1 5.8* 6.2* 6.5* 5.9*  CL 110 108 111 109 107 110 107  CO2 20* 21* 19* 19* 18* 17* 18*  GLUCOSE 108* 101* 102* 94 86 91 87  BUN 24* 37* 53* 66* 71* 77* 80*  CREATININE 1.12* 2.06* 3.05* 3.92* 4.52* 4.49* 4.70*  CALCIUM 6.9* 7.1* 7.2* 7.2* 7.5* 7.6* 8.0*  PHOS  --   --  7.1* 9.0* 9.9* 10.2* 10.5*   CBC Recent Labs  Lab 02/15/22 0446 02/15/22 1730 02/16/22 0509 02/17/22 0055 02/17/22 0702  WBC 20.8*  --  17.6* 17.6* 17.6*  HGB 6.7* 9.5* 7.7* 8.4* 8.6*  HCT 20.1* 28.5* 23.5* 26.2* 25.8*  MCV 72.0*  --  72.8* 74.6* 72.5*  PLT 389  --  410* 491* 574*    Medications:     acetaminophen  1,000 mg Oral Q6H   bisacodyl  10 mg Oral Daily   Chlorhexidine Gluconate Cloth  6 each Topical Q0600   clonazePAM  0.5 mg Oral BID   docusate sodium  100 mg Oral BID  heparin sodium (porcine)       lidocaine       metoprolol tartrate  12.5 mg Oral BID   mupirocin ointment   Nasal BID   nicotine  14 mg Transdermal Daily   ondansetron       QUEtiapine  25 mg Oral QHS   sodium chloride flush  3 mL Intravenous Q12H   sodium polystyrene  30 g Rectal Once   sodium zirconium cyclosilicate  10 g Oral TID   Anthony Sar, MD [facility] 02/17/2022, 12:26 PM

## 2022-02-18 DIAGNOSIS — R7881 Bacteremia: Secondary | ICD-10-CM | POA: Diagnosis not present

## 2022-02-18 DIAGNOSIS — R0781 Pleurodynia: Secondary | ICD-10-CM | POA: Diagnosis not present

## 2022-02-18 DIAGNOSIS — N179 Acute kidney failure, unspecified: Secondary | ICD-10-CM | POA: Diagnosis not present

## 2022-02-18 DIAGNOSIS — A419 Sepsis, unspecified organism: Secondary | ICD-10-CM | POA: Diagnosis not present

## 2022-02-18 LAB — RENAL FUNCTION PANEL
Albumin: 1.6 g/dL — ABNORMAL LOW (ref 3.5–5.0)
Anion gap: 11 (ref 5–15)
BUN: 71 mg/dL — ABNORMAL HIGH (ref 6–20)
CO2: 19 mmol/L — ABNORMAL LOW (ref 22–32)
Calcium: 7.1 mg/dL — ABNORMAL LOW (ref 8.9–10.3)
Chloride: 106 mmol/L (ref 98–111)
Creatinine, Ser: 4.54 mg/dL — ABNORMAL HIGH (ref 0.44–1.00)
GFR, Estimated: 13 mL/min — ABNORMAL LOW (ref >60)
Glucose, Bld: 90 mg/dL (ref 70–99)
Phosphorus: 9.7 mg/dL — ABNORMAL HIGH (ref 2.5–4.6)
Potassium: 5.2 mmol/L — ABNORMAL HIGH (ref 3.5–5.1)
Sodium: 136 mmol/L (ref 135–145)

## 2022-02-18 LAB — CBC
HCT: 25.1 % — ABNORMAL LOW (ref 36.0–46.0)
Hemoglobin: 8.4 g/dL — ABNORMAL LOW (ref 12.0–15.0)
MCH: 24.2 pg — ABNORMAL LOW (ref 26.0–34.0)
MCHC: 33.5 g/dL (ref 30.0–36.0)
MCV: 72.3 fL — ABNORMAL LOW (ref 80.0–100.0)
Platelets: 537 10*3/uL — ABNORMAL HIGH (ref 150–400)
RBC: 3.47 MIL/uL — ABNORMAL LOW (ref 3.87–5.11)
RDW: 23.5 % — ABNORMAL HIGH (ref 11.5–15.5)
WBC: 16.1 10*3/uL — ABNORMAL HIGH (ref 4.0–10.5)
nRBC: 0 % (ref 0.0–0.2)

## 2022-02-18 LAB — CULTURE, BLOOD (ROUTINE X 2)
Culture: NO GROWTH
Culture: NO GROWTH
Special Requests: ADEQUATE
Special Requests: ADEQUATE
Special Requests: ADEQUATE

## 2022-02-18 LAB — GLUCOSE, CAPILLARY
Glucose-Capillary: 102 mg/dL — ABNORMAL HIGH (ref 70–99)
Glucose-Capillary: 111 mg/dL — ABNORMAL HIGH (ref 70–99)
Glucose-Capillary: 107 mg/dL — ABNORMAL HIGH (ref 70–99)
Glucose-Capillary: 103 mg/dL — ABNORMAL HIGH (ref 70–99)

## 2022-02-18 LAB — HEPATITIS B SURFACE ANTIBODY, QUANTITATIVE: Hep B S AB Quant (Post): >1000 m[IU]/mL (ref >9.9)

## 2022-02-18 MED ORDER — SODIUM CHLORIDE 0.9 % IV SOLN
200.0000 mg | Freq: Three times a day (TID) | INTRAVENOUS | Status: DC
  Administered 2022-02-18 – 2022-02-26 (×24): 200 mg via INTRAVENOUS
  Filled 2022-02-18: qty 6.7
  Filled 2022-02-18: qty 6.67
  Filled 2022-02-18 (×4): qty 10
  Filled 2022-02-18: qty 6.67
  Filled 2022-02-18: qty 6.7
  Filled 2022-02-18: qty 6.67
  Filled 2022-02-18 (×2): qty 10
  Filled 2022-02-18: qty 6.7
  Filled 2022-02-18 (×2): qty 10
  Filled 2022-02-18: qty 6.67
  Filled 2022-02-18 (×5): qty 10
  Filled 2022-02-18: qty 6.67
  Filled 2022-02-18: qty 10
  Filled 2022-02-18: qty 6.67
  Filled 2022-02-18 (×7): qty 10
  Filled 2022-02-18: qty 6.7
  Filled 2022-02-18 (×5): qty 10

## 2022-02-18 MED ORDER — ACETAMINOPHEN 500 MG PO TABS
1000.0000 mg | ORAL_TABLET | Freq: Four times a day (QID) | ORAL | Status: DC
Start: 2022-02-18 — End: 2022-02-23
  Administered 2022-02-18 – 2022-02-22 (×15): 1000 mg via ORAL
  Filled 2022-02-18 (×16): qty 2

## 2022-02-18 MED ORDER — SODIUM CHLORIDE 0.9 % IV SOLN
500.0000 mg | INTRAVENOUS | Status: DC
  Administered 2022-02-18 – 2022-02-24 (×4): 500 mg via INTRAVENOUS
  Filled 2022-02-18 (×5): qty 10

## 2022-02-18 MED ORDER — SODIUM CHLORIDE 0.9% FLUSH: 10.0000 mL | INTRAVENOUS | Status: DC | PRN

## 2022-02-18 MED ORDER — CHLORHEXIDINE GLUCONATE CLOTH 2 % EX PADS
6.0000 | MEDICATED_PAD | Freq: Every day | CUTANEOUS | Status: DC
  Administered 2022-02-19 – 2022-02-26 (×7): 6 via TOPICAL

## 2022-02-18 MED ORDER — SODIUM CHLORIDE 0.9% FLUSH
10.0000 mL | Freq: Two times a day (BID) | INTRAVENOUS | Status: DC
  Administered 2022-02-18 – 2022-02-26 (×14): 10 mL

## 2022-02-18 NOTE — Progress Notes (Signed)
Dialyzer clotted, ns flush and reversing arterial and venous blood line unsuccessful. Pt with 32 minutes of hemodialyses remaining prior to ending tx. Charge nurse AD notified nephrology clinician. AD states clinician ok to end tx with plan to diialyze tomorrow. 1320 cc of fluid removed.

## 2022-02-18 NOTE — Progress Notes (Signed)
PHARMACY NOTE:  ANTIMICROBIAL RENAL DOSAGE ADJUSTMENT  Current antimicrobial regimen includes a mismatch between antimicrobial dosage and estimated renal function.  As per policy approved by the Pharmacy & Therapeutics and Medical Executive Committees, the antimicrobial dosage will be adjusted accordingly.  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   Estimated Creatinine Clearance: 16.2 mL/min (A) (by C-G formula based on SCr of 4.54 mg/dL (H)).     Antimicrobial dosage has been changed to:   Daptomycin 500 mg IV every 48 hours Ceftaroline 200 mg IV every 8 hours  Additional comments: Adjusted Daptomycin back to Adjusted body weight dose. Weight gain likely secondary to fluid overload. Will adjust ceftaroline for dialysis.     Sharin Mons, PharmD, BCPS, BCIDP Infectious Diseases Clinical Pharmacist Phone: [phone] 02/18/2022 10:29 AM  **Pharmacist phone directory can be found on [email or web address] listed under [facility]**

## 2022-02-18 NOTE — Progress Notes (Signed)
Progress Note  Patient: Carol Rivera EXB:[id number] DOB: 04/04/1991  DOA: 02/06/2022  DOS: 02/18/2022    Brief hospital course: TIANNE PLOTT is a 31 y.o. female with a history of IVDU who presented 5/18 with fever, cough, chest pain and left shoulder pain. She was found to have septic shock due to MRSA bacteremia as well as large destructive TV endocarditis with TR. IV antibiotics were continued and tailored to cultures which were persistently positive until 5/25 which have remain NGTD. CT surgery stated the location (ventricular side) of the vegetations is not accessible by angiovac and she is not a candidate for valve replacement. IV daptomycin and ceftaroline have been continued per ID recommendations. Course complicated by left AC septic joint s/p I&D 5/22, anemia of acute illness s/p 1u PRBCs 5/27, and renal failure with anasarca for which dialysis will be initiated through right IJ TDC placed 02/17/2022.   Assessment and Plan: MRSA bacteremia with multifocal metastatic infections, septic pulmonary emboli, tricuspid valve endocarditis with TR: CT surgery eval 5/22 not accessible via angiovac, and not a candidate for valve replacement given recent IV drug abuse and valve destruction.  - TEE 5/23: large mobile TV posterior leaflet vegetation with destruction of the leaflet and "torrential tricuspid insufficiency."  - Blood cultures were persistently positive, though 5/25 set is NGTD.  - Daptomycin, teflaro per ID. Dosing per pharmacy. CK serially checked, remains low.  Anemia of acute illness:  - H/H stable s/p 1u PRBCs 5/27. No active bleeding, Coombs negative.  Acute renal failure: With hyperkalemia, metabolic acidosis, declining urine output, suspect she may require HD for which nephrology has consulted IR for access. Renal U/S with increased echogenicity, no hydro. - IR placed right IJ [facility] 5/29, had 1st HD same day, 2nd 5/30 scheduled. Appreciate nephrology assistance. - Monitor  UOP. - C3, C4 still pending, though has active findings on UA. Also concern for RV failure from severe TR.  Hyperkalemia:  - Improving with dialysis. Intervals and T wave morphology stable on cardiac monitoring.  Wheezing: Resolved. - Continue albuterol prn  T11 osteomyelitis: demonstrating T2-hyperintensity, suspect new osteomyelitis. Dr. Maurice Small, neurosurgery evaluated the patient on 5/26, recommending continuation of antibiotics without acute neurosurgical intervention. +clonus on L > R so could consider repeat MR brain given her risk of septic cerebral emboli.  - Continue pain control as ordered. Will need to deescalate from IV analgesics in the coming days.  Left AC septic arthritis: s/p I&D 5/22.  - Wound care per orthopedics. Will need suture removal ~10 day postoperatively per orthopedics.  IVDU:  - Cessation counseling provided.   Acute metabolic encephalopathy: Improved from admission.  - Continue supportive seroquel and clonazepam.   Obesity: Estimated body mass index is 32.29 kg/m as calculated from the following:   Height as of this encounter: 5' (1.524 m).   Weight as of this encounter: 75 kg.  Subjective: Sleeping soundly, will rouse and answer questions briefly. For the most part, keeps eyes closed and mumbles. Her only complaint is pain all over.  Objective: Vitals:   02/18/22 0156 02/18/22 0311 02/18/22 0738 02/18/22 0939  BP:  98/63 108/74 117/76  Pulse: 94 93 87 88  Resp:  20 (!) 22 20  Temp:  97.9 F (36.6 C) 98.3 F (36.8 C)   TempSrc:  Axillary Oral   SpO2: 95% 93% 95% 95%  Weight:      Height:       Gen: 31 y.o. female in no distress Pulm: Nonlabored breathing room  air, tachypneic. Clearing. CV: Regular rate and rhythm. No murmur, rub, or gallop. No JVD, no dependent edema. GI: Abdomen soft, non-tender, non-distended, with normoactive bowel sounds.  Ext: Warm, no deformities Skin: No new rashes, lesions or ulcers on visualized skin. TDC site  c/d/I, left AC dressing c/d/i Neuro: Drowsy but rousable and oriented. No focal neurological deficits. Psych: Judgement and insight appear fair. Mood euthymic & affect congruent. Behavior is appropriate.    Data Personally reviewed: CBC: Recent Labs  Lab 02/15/22 0446 02/15/22 1730 02/16/22 0509 02/17/22 0055 02/17/22 0702 02/18/22 0144  WBC 20.8*  --  17.6* 17.6* 17.6* 16.1*  HGB 6.7* 9.5* 7.7* 8.4* 8.6* 8.4*  HCT 20.1* 28.5* 23.5* 26.2* 25.8* 25.1*  MCV 72.0*  --  72.8* 74.6* 72.5* 72.3*  PLT 389  --  410* 491* 574* 537*   Basic Metabolic Panel: Recent Labs  Lab 02/12/22 0224 02/13/22 0119 02/14/22 0551 02/14/22 0551 02/15/22 0446 02/16/22 0509 02/16/22 1601 02/17/22 0055 02/17/22 0702 02/18/22 0144  NA 136 136 136  --  137 136 136 138 135 136  K 3.6 3.7 4.4  --  5.1 5.8* 6.2* 6.5* 5.9* 5.2*  CL 109 110 108  --  111 109 107 110 107 106  CO2 22 20* 21*  --  19* 19* 18* 17* 18* 19*  GLUCOSE 120* 108* 101*  --  102* 94 86 91 87 90  BUN 24* 24* 37*  --  53* 66* 71* 77* 80* 71*  CREATININE 0.98 1.12* 2.06*  --  3.05* 3.92* 4.52* 4.49* 4.70* 4.54*  CALCIUM 6.9* 6.9* 7.1*  --  7.2* 7.2* 7.5* 7.6* 8.0* 7.1*  MG 2.1 2.1 2.0  --  2.1  --   --   --   --   --   PHOS  --   --   --    < > 7.1* 9.0* 9.9* 10.2* 10.5* 9.7*   < > = values in this interval not displayed.   GFR: Estimated Creatinine Clearance: 16.2 mL/min (A) (by C-G formula based on SCr of 4.54 mg/dL (H)). Liver Function Tests: Recent Labs  Lab 02/12/22 0224 02/13/22 0119 02/14/22 0551 02/15/22 0446 02/16/22 0509 02/16/22 1601 02/17/22 0055 02/17/22 0702 02/18/22 0144  AST 34 27 23  --  18  --   --   --   --   ALT 36 28 22  --  16  --   --   --   --   ALKPHOS 107 102 101  --  78  --   --   --   --   BILITOT 2.0* 1.4* 1.4*  --  1.5*  --   --   --   --   PROT 5.1* 5.1* 5.3*  --  5.7*  --   --   --   --   ALBUMIN <1.5* <1.5* <1.5*   < > 1.9*  1.9* 2.2* 2.1* 1.9* 1.6*   < > = values in this interval not  displayed.   No results for input(s): LIPASE, AMYLASE in the last 168 hours. No results for input(s): AMMONIA in the last 168 hours. Coagulation Profile: No results for input(s): INR, PROTIME in the last 168 hours. Cardiac Enzymes: Recent Labs  Lab 02/16/22 0509  CKTOTAL 23*   BNP (last 3 results) No results for input(s): PROBNP in the last 8760 hours. HbA1C: No results for input(s): HGBA1C in the last 72 hours. CBG: Recent Labs  Lab 02/16/22 2100 02/17/22 8469  02/17/22 1131 02/17/22 2054 02/18/22 0741  GLUCAP 78 121* 92 92 102*   Lipid Profile: No results for input(s): CHOL, HDL, LDLCALC, TRIG, CHOLHDL, LDLDIRECT in the last 72 hours. Thyroid Function Tests: No results for input(s): TSH, T4TOTAL, FREET4, T3FREE, THYROIDAB in the last 72 hours. Anemia Panel: No results for input(s): VITAMINB12, FOLATE, FERRITIN, TIBC, IRON, RETICCTPCT in the last 72 hours. Urine analysis:    Component Value Date/Time   COLORURINE AMBER (A) 02/14/2022 1751   APPEARANCEUR CLOUDY (A) 02/14/2022 1751   LABSPEC 1.014 02/14/2022 1751   PHURINE 5.0 02/14/2022 1751   GLUCOSEU NEGATIVE 02/14/2022 1751   HGBUR LARGE (A) 02/14/2022 1751   BILIRUBINUR NEGATIVE 02/14/2022 1751   KETONESUR NEGATIVE 02/14/2022 1751   PROTEINUR 100 (A) 02/14/2022 1751   UROBILINOGEN 0.2 02/24/2012 2301   NITRITE NEGATIVE 02/14/2022 1751   LEUKOCYTESUR SMALL (A) 02/14/2022 1751   Recent Results (from the past 240 hour(s))  Culture, blood (Routine X 2) w Reflex to ID Panel     Status: Abnormal   Collection Time: 02/10/22  6:43 AM   Specimen: BLOOD  Result Value Ref Range Status   Specimen Description BLOOD LEFT ANTECUBITAL  Final   Special Requests   Final    BOTTLES DRAWN AEROBIC AND ANAEROBIC Blood Culture adequate volume   Culture  Setup Time   Final    GRAM POSITIVE COCCI IN CLUSTERS IN BOTH AEROBIC AND ANAEROBIC BOTTLES CRITICAL RESULT CALLED TO, READ BACK BY AND VERIFIED WITH: Tandy Gaw 1403  [id number] FCP Performed at [facility], [address] [place], [place] [address]    Culture METHICILLIN RESISTANT STAPHYLOCOCCUS AUREUS (A)  Final   Report Status 02/14/2022 FINAL  Final   Organism ID, Bacteria METHICILLIN RESISTANT STAPHYLOCOCCUS AUREUS  Final      Susceptibility   Methicillin resistant staphylococcus aureus - MIC*    CIPROFLOXACIN >=8 RESISTANT Resistant     ERYTHROMYCIN >=8 RESISTANT Resistant     GENTAMICIN <=0.5 SENSITIVE Sensitive     OXACILLIN >=4 RESISTANT Resistant     TETRACYCLINE <=1 SENSITIVE Sensitive     VANCOMYCIN 1 SENSITIVE Sensitive     TRIMETH/SULFA >=320 RESISTANT Resistant     CLINDAMYCIN <=0.25 SENSITIVE Sensitive     RIFAMPIN <=0.5 SENSITIVE Sensitive     Inducible Clindamycin NEGATIVE Sensitive     * METHICILLIN RESISTANT STAPHYLOCOCCUS AUREUS  Blood Culture ID Panel (Reflexed)     Status: Abnormal   Collection Time: 02/10/22  6:43 AM  Result Value Ref Range Status   Enterococcus faecalis NOT DETECTED NOT DETECTED Final   Enterococcus Faecium NOT DETECTED NOT DETECTED Final   Listeria monocytogenes NOT DETECTED NOT DETECTED Final   Staphylococcus species DETECTED (A) NOT DETECTED Final    Comment: CRITICAL RESULT CALLED TO, READ BACK BY AND VERIFIED WITH: PHARMD RACHEL S 1403 [id number] FCP    Staphylococcus aureus (BCID) DETECTED (A) NOT DETECTED Final    Comment: Methicillin (oxacillin)-resistant Staphylococcus aureus (MRSA). MRSA is predictably resistant to beta-lactam antibiotics (except ceftaroline). Preferred therapy is vancomycin unless clinically contraindicated. Patient requires contact precautions if  hospitalized. CRITICAL RESULT CALLED TO, READ BACK BY AND VERIFIED WITH: PHARMD RACHEL S 1403 [id number] FCP    Staphylococcus epidermidis NOT DETECTED NOT DETECTED Final   Staphylococcus lugdunensis NOT DETECTED NOT DETECTED Final   Streptococcus species NOT DETECTED NOT DETECTED Final   Streptococcus agalactiae NOT DETECTED NOT  DETECTED Final   Streptococcus pneumoniae NOT DETECTED NOT DETECTED Final   Streptococcus pyogenes  NOT DETECTED NOT DETECTED Final   A.calcoaceticus-baumannii NOT DETECTED NOT DETECTED Final   Bacteroides fragilis NOT DETECTED NOT DETECTED Final   Enterobacterales NOT DETECTED NOT DETECTED Final   Enterobacter cloacae complex NOT DETECTED NOT DETECTED Final   Escherichia coli NOT DETECTED NOT DETECTED Final   Klebsiella aerogenes NOT DETECTED NOT DETECTED Final   Klebsiella oxytoca NOT DETECTED NOT DETECTED Final   Klebsiella pneumoniae NOT DETECTED NOT DETECTED Final   Proteus species NOT DETECTED NOT DETECTED Final   Salmonella species NOT DETECTED NOT DETECTED Final   Serratia marcescens NOT DETECTED NOT DETECTED Final   Haemophilus influenzae NOT DETECTED NOT DETECTED Final   Neisseria meningitidis NOT DETECTED NOT DETECTED Final   Pseudomonas aeruginosa NOT DETECTED NOT DETECTED Final   Stenotrophomonas maltophilia NOT DETECTED NOT DETECTED Final   Candida albicans NOT DETECTED NOT DETECTED Final   Candida auris NOT DETECTED NOT DETECTED Final   Candida glabrata NOT DETECTED NOT DETECTED Final   Candida krusei NOT DETECTED NOT DETECTED Final   Candida parapsilosis NOT DETECTED NOT DETECTED Final   Candida tropicalis NOT DETECTED NOT DETECTED Final   Cryptococcus neoformans/gattii NOT DETECTED NOT DETECTED Final   Meth resistant mecA/C and MREJ DETECTED (A) NOT DETECTED Final    Comment: CRITICAL RESULT CALLED TO, READ BACK BY AND VERIFIED WITH: Gordan Payment 1403 [id number] FCP Performed at [facility], [address], [place], [place] [address]   Culture, blood (Routine X 2) w Reflex to ID Panel     Status: Abnormal   Collection Time: 02/10/22  6:53 AM   Specimen: BLOOD  Result Value Ref Range Status   Specimen Description BLOOD LEFT ANTECUBITAL  Final   Special Requests   Final    BOTTLES DRAWN AEROBIC AND ANAEROBIC Blood Culture adequate volume   Culture  Setup Time    Final    GRAM POSITIVE COCCI IN CLUSTERS IN BOTH AEROBIC AND ANAEROBIC BOTTLES CRITICAL RESULT CALLED TO, READ BACK BY AND VERIFIED WITH: PHARMD EDEN BREWINGTON ON 02/13/22 @ 1538 BY DRT    Culture (A)  Final    STAPHYLOCOCCUS AUREUS SUSCEPTIBILITIES PERFORMED ON PREVIOUS CULTURE WITHIN THE LAST 5 DAYS. Performed at [facility], [address], [place], [place] [address]    Report Status 02/15/2022 FINAL  Final  Surgical PCR screen     Status: Abnormal   Collection Time: 02/10/22  9:22 AM   Specimen: Nasal Mucosa; Nasal Swab  Result Value Ref Range Status   MRSA, PCR POSITIVE (A) NEGATIVE Final    Comment: RESULT CALLED TO, READ BACK BY AND VERIFIED WITH: OLVIA MARTIN @1503   DL    Staphylococcus aureus POSITIVE (A) NEGATIVE Final    Comment: (NOTE) The Xpert SA Assay (FDA approved for NASAL specimens in patients 93 years of age and older), is one component of a comprehensive surveillance program. It is not intended to diagnose infection nor to guide or monitor treatment. Performed at [facility], [address], [place], [place]   Aerobic/Anaerobic Culture w Gram Stain (surgical/deep wound)     Status: None   Collection Time: 02/10/22 11:31 AM   Specimen: PATH Other; Tissue  Result Value Ref Range Status   Specimen Description WOUND  Final   Special Requests LEFT AC JOINT  Final   Gram Stain   Final    RARE WBC PRESENT,BOTH PMN AND MONONUCLEAR RARE GRAM POSITIVE COCCI IN CLUSTERS    Culture   Final  FEW METHICILLIN RESISTANT STAPHYLOCOCCUS AUREUS NO ANAEROBES ISOLATED Performed at [facility], [address] [place], [place] [address]    Report Status 02/15/2022 FINAL  Final   Organism ID, Bacteria METHICILLIN RESISTANT STAPHYLOCOCCUS AUREUS  Final      Susceptibility   Methicillin resistant staphylococcus aureus - MIC*    CIPROFLOXACIN >=8 RESISTANT Resistant     ERYTHROMYCIN >=8 RESISTANT Resistant     GENTAMICIN <=0.5 SENSITIVE  Sensitive     OXACILLIN >=4 RESISTANT Resistant     TETRACYCLINE <=1 SENSITIVE Sensitive     VANCOMYCIN <=0.5 SENSITIVE Sensitive     TRIMETH/SULFA >=320 RESISTANT Resistant     CLINDAMYCIN <=0.25 SENSITIVE Sensitive     RIFAMPIN <=0.5 SENSITIVE Sensitive     Inducible Clindamycin NEGATIVE Sensitive     * FEW METHICILLIN RESISTANT STAPHYLOCOCCUS AUREUS  Culture, blood (Routine X 2) w Reflex to ID Panel     Status: None   Collection Time: 02/13/22  1:19 AM   Specimen: BLOOD LEFT HAND  Result Value Ref Range Status   Specimen Description BLOOD LEFT HAND  Final   Special Requests AEROBIC BOTTLE ONLY Blood Culture adequate volume  Final   Culture   Final    NO GROWTH 5 DAYS Performed at [facility], [address] [place], [place] [address]    Report Status 02/18/2022 FINAL  Final  Culture, blood (Routine X 2) w Reflex to ID Panel     Status: None   Collection Time: 02/13/22  1:38 AM   Specimen: BLOOD RIGHT HAND  Result Value Ref Range Status   Specimen Description BLOOD RIGHT HAND  Final   Special Requests   Final    BOTTLES DRAWN AEROBIC AND ANAEROBIC Blood Culture adequate volume   Culture   Final    NO GROWTH 5 DAYS Performed at [facility], [address] [place], [place] [address]    Report Status 02/18/2022 FINAL  Final     IR Fluoro Guide CV Line Right  Result Date: 02/17/2022 INDICATION: Need for dialysis access EXAM: 1. Ultrasound-guided puncture of the right internal jugular vein 2. Placement of a temporary hemodialysis catheter using fluoroscopic guidance MEDICATIONS: None ANESTHESIA/SEDATION: Local analgesia FLUOROSCOPY TIME:  Fluoroscopy Time: 0.2 minutes (1 mGy) COMPLICATIONS: None immediate. PROCEDURE: Informed written consent was obtained from the patient after a thorough discussion of the procedural risks, benefits and alternatives. All questions were addressed. Maximal Sterile Barrier Technique was utilized including caps, mask, sterile gowns, sterile  gloves, sterile drape, hand hygiene and skin antiseptic. A timeout was performed prior to the initiation of the procedure. The patient was placed supine on the exam table. The right neck and chest was prepped and draped in the standard sterile fashion. Ultrasound was used to evaluate the right internal jugular vein, which was found to be widely patent. A permanent image was stored in the electronic medical record. Using ultrasound guidance, the right internal jugular vein was directly punctured using a 21 gauge micropuncture set. Access site was serially dilated to accommodate an 035 wire, which was advanced into the IVC. Subsequently, serial tract dilation was performed and a 16 cm temporary triple-lumen hemodialysis catheter was advanced into the central veins, such that the tip overlies the superior cavoatrial junction. The catheter was found to aspirate and flush appropriately. It was locked with the appropriate volume of heparinized saline. It was secured to the skin using silk suture and a sterile dressing. The patient tolerated the procedure  well without immediate complication. IMPRESSION: Successful placement of a temporary triple-lumen hemodialysis catheter via the right internal jugular vein using ultrasound and fluoroscopic guidance. The line is ready for immediate use. Electronically Signed   By: Olive Bass M.D.   On: 02/17/2022 09:28   IR US Guide Vasc Access Right  Result Date: 02/17/2022 INDICATION: Need for dialysis access EXAM: 1. Ultrasound-guided puncture of the right internal jugular vein 2. Placement of a temporary hemodialysis catheter using fluoroscopic guidance MEDICATIONS: None ANESTHESIA/SEDATION: Local analgesia FLUOROSCOPY TIME:  Fluoroscopy Time: 0.2 minutes (1 mGy) COMPLICATIONS: None immediate. PROCEDURE: Informed written consent was obtained from the patient after a thorough discussion of the procedural risks, benefits and alternatives. All questions were addressed. Maximal  Sterile Barrier Technique was utilized including caps, mask, sterile gowns, sterile gloves, sterile drape, hand hygiene and skin antiseptic. A timeout was performed prior to the initiation of the procedure. The patient was placed supine on the exam table. The right neck and chest was prepped and draped in the standard sterile fashion. Ultrasound was used to evaluate the right internal jugular vein, which was found to be widely patent. A permanent image was stored in the electronic medical record. Using ultrasound guidance, the right internal jugular vein was directly punctured using a 21 gauge micropuncture set. Access site was serially dilated to accommodate an 035 wire, which was advanced into the IVC. Subsequently, serial tract dilation was performed and a 16 cm temporary triple-lumen hemodialysis catheter was advanced into the central veins, such that the tip overlies the superior cavoatrial junction. The catheter was found to aspirate and flush appropriately. It was locked with the appropriate volume of heparinized saline. It was secured to the skin using silk suture and a sterile dressing. The patient tolerated the procedure well without immediate complication. IMPRESSION: Successful placement of a temporary triple-lumen hemodialysis catheter via the right internal jugular vein using ultrasound and fluoroscopic guidance. The line is ready for immediate use. Electronically Signed   By: Olive Bass M.D.   On: 02/17/2022 09:28    Family Communication: None at bedside  Disposition: Status is: Inpatient Remains inpatient appropriate because: Critically ill. Initiating dialysis, on IV abx. Planned Discharge Destination:  TBD  Tyrone Nine, MD 02/18/2022 10:20 AM Page by [email or web address]

## 2022-02-18 NOTE — Progress Notes (Addendum)
Patient ID: Carol Rivera, female   DOB: [birth year], 31 y.o.   MRN: [id number] [facility] Progress Note   Assessment/ Plan:   1.  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 Would not do a biopsy as this will not change her overall management which is to treat the underlying infection (immunosuppression not indicated in such settings). -started HD given worsening kidney function, borderline UOP, persistent hyperkalemia. S/p RIJ temp line 5/29 with IR. HD #1 on 5/30 (slow start protocol) -#2 treatment today, #3 treatment planned for either tomorrow or Thursday depending on inpatient census and patient's clinical status -if no improvement in kidney function, then will need to consider converting her temp line to a TDC 2.  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  On daptomycin and ceftaroline. Repeat bcx on 5/25 neg 3.  Anemia: Likely secondary to acute/critical illness.  With continued downtrend of hemoglobin/hematocrit and mild hyperkalemia, would be prudent to evaluate for Teflaro associated drug-induced auto hemolytic anemia.  Elevated LDH with negative direct Coombs test and pending haptoglobin. Transfuse prn, avoid IV iron 4.  Anasarca/hypoalbuminemia: with significant proteinruia. Push protein. Will UF as tolerated 5.  Hyperkalemia: Secondary to acute kidney injury, starting HD, K improving.   Subjective:   Patient seen and examined in HD unit, about to be hooked up. Quite somnolent today but does respond to vocal stimuli. Seems that she tolerated HD yesterday, net UF 1.7L   Objective:   BP 117/76 (BP Location: Left Arm)   Pulse 88   Temp 98.3 F (36.8 C) (Oral)   Resp 20    Ht 5' (1.524 m)   Wt 75 kg   SpO2 95%   BMI 32.29 kg/m   Intake/Output Summary (Last 24 hours) at 02/18/2022 1024 Last data filed at 02/17/2022 1620 Gross per 24 hour  Intake 120 ml  Output 1700 ml  Net -1580 ml   Weight change: 3.1 kg  Physical Exam: Gen: somnolent, laying flat in bed CVS: Pulse regular rhythm, normal rate, 3/6 holosystolic murmur Resp: Coarse breath sounds bilaterally diminished over right base, normal WOB Abd: Soft, flat, nontender, bowel sounds normal Ext: Trace lower extremity/upper extremity edema Dialysis access: RIJ temp line  Imaging: IR Fluoro Guide CV Line Right  Result Date: 02/17/2022 INDICATION: Need for dialysis access EXAM: 1. Ultrasound-guided puncture of the right internal jugular vein 2. Placement of a temporary hemodialysis catheter using fluoroscopic guidance MEDICATIONS: None ANESTHESIA/SEDATION: Local analgesia FLUOROSCOPY TIME:  Fluoroscopy Time: 0.2 minutes (1 mGy) COMPLICATIONS: None immediate. PROCEDURE: Informed written consent was obtained from the patient after a thorough discussion of the procedural risks, benefits and alternatives. All questions were addressed. Maximal Sterile Barrier Technique was utilized including caps, mask, sterile gowns, sterile gloves, sterile drape, hand hygiene and skin antiseptic. A timeout was performed prior to the initiation of the procedure. The patient was placed supine on the exam table. The right neck and chest was prepped and draped in the standard sterile fashion. Ultrasound was used to evaluate the right internal jugular vein, which was found to be widely patent. A permanent image was stored in the electronic medical record. Using ultrasound guidance, the right internal jugular vein was directly punctured using a 21 gauge micropuncture set. Access site was serially dilated  to accommodate an 035 wire, which was advanced into the IVC. Subsequently, serial tract dilation was performed and a 16 cm temporary  triple-lumen hemodialysis catheter was advanced into the central veins, such that the tip overlies the superior cavoatrial junction. The catheter was found to aspirate and flush appropriately. It was locked with the appropriate volume of heparinized saline. It was secured to the skin using silk suture and a sterile dressing. The patient tolerated the procedure well without immediate complication. IMPRESSION: Successful placement of a temporary triple-lumen hemodialysis catheter via the right internal jugular vein using ultrasound and fluoroscopic guidance. The line is ready for immediate use. Electronically Signed   By: Albin Felling M.D.   On: 02/17/2022 09:28   IR US Guide Vasc Access Right  Result Date: 02/17/2022 INDICATION: Need for dialysis access EXAM: 1. Ultrasound-guided puncture of the right internal jugular vein 2. Placement of a temporary hemodialysis catheter using fluoroscopic guidance MEDICATIONS: None ANESTHESIA/SEDATION: Local analgesia FLUOROSCOPY TIME:  Fluoroscopy Time: 0.2 minutes (1 mGy) COMPLICATIONS: None immediate. PROCEDURE: Informed written consent was obtained from the patient after a thorough discussion of the procedural risks, benefits and alternatives. All questions were addressed. Maximal Sterile Barrier Technique was utilized including caps, mask, sterile gowns, sterile gloves, sterile drape, hand hygiene and skin antiseptic. A timeout was performed prior to the initiation of the procedure. The patient was placed supine on the exam table. The right neck and chest was prepped and draped in the standard sterile fashion. Ultrasound was used to evaluate the right internal jugular vein, which was found to be widely patent. A permanent image was stored in the electronic medical record. Using ultrasound guidance, the right internal jugular vein was directly punctured using a 21 gauge micropuncture set. Access site was serially dilated to accommodate an 035 wire, which was advanced into  the IVC. Subsequently, serial tract dilation was performed and a 16 cm temporary triple-lumen hemodialysis catheter was advanced into the central veins, such that the tip overlies the superior cavoatrial junction. The catheter was found to aspirate and flush appropriately. It was locked with the appropriate volume of heparinized saline. It was secured to the skin using silk suture and a sterile dressing. The patient tolerated the procedure well without immediate complication. IMPRESSION: Successful placement of a temporary triple-lumen hemodialysis catheter via the right internal jugular vein using ultrasound and fluoroscopic guidance. The line is ready for immediate use. Electronically Signed   By: Albin Felling M.D.   On: 02/17/2022 09:28    Labs: BMET Recent Labs  Lab 02/14/22 0551 02/15/22 0446 02/16/22 0509 02/16/22 1601 02/17/22 0055 02/17/22 0702 02/18/22 0144  NA 136 137 136 136 138 135 136  K 4.4 5.1 5.8* 6.2* 6.5* 5.9* 5.2*  CL 108 111 109 107 110 107 106  CO2 21* 19* 19* 18* 17* 18* 19*  GLUCOSE 101* 102* 94 86 91 87 90  BUN 37* 53* 66* 71* 77* 80* 71*  CREATININE 2.06* 3.05* 3.92* 4.52* 4.49* 4.70* 4.54*  CALCIUM 7.1* 7.2* 7.2* 7.5* 7.6* 8.0* 7.1*  PHOS  --  7.1* 9.0* 9.9* 10.2* 10.5* 9.7*   CBC Recent Labs  Lab 02/16/22 0509 02/17/22 0055 02/17/22 0702 02/18/22 0144  WBC 17.6* 17.6* 17.6* 16.1*  HGB 7.7* 8.4* 8.6* 8.4*  HCT 23.5* 26.2* 25.8* 25.1*  MCV 72.8* 74.6* 72.5* 72.3*  PLT 410* 491* 574* 537*    Medications:     acetaminophen  1,000 mg Oral Q6H   bisacodyl  10 mg Oral Daily  Chlorhexidine Gluconate Cloth  6 each Topical Q0600   Chlorhexidine Gluconate Cloth  6 each Topical Q0600   clonazePAM  0.5 mg Oral BID   docusate sodium  100 mg Oral BID   metoprolol tartrate  12.5 mg Oral BID   mupirocin ointment   Nasal BID   nicotine  14 mg Transdermal Daily   QUEtiapine  25 mg Oral QHS   sodium chloride flush  10-40 mL Intracatheter Q12H   sodium  chloride flush  3 mL Intravenous Q12H   sodium polystyrene  30 g Rectal Once   Gean Quint, MD [facility] 02/18/2022, 10:24 AM

## 2022-02-19 DIAGNOSIS — N179 Acute kidney failure, unspecified: Secondary | ICD-10-CM | POA: Diagnosis not present

## 2022-02-19 DIAGNOSIS — B9562 Methicillin resistant Staphylococcus aureus infection as the cause of diseases classified elsewhere: Secondary | ICD-10-CM | POA: Diagnosis not present

## 2022-02-19 DIAGNOSIS — R7881 Bacteremia: Secondary | ICD-10-CM | POA: Diagnosis not present

## 2022-02-19 LAB — RENAL FUNCTION PANEL
Albumin: 1.6 g/dL — ABNORMAL LOW (ref 3.5–5.0)
Anion gap: 9 (ref 5–15)
BUN: 67 mg/dL — ABNORMAL HIGH (ref 6–20)
CO2: 21 mmol/L — ABNORMAL LOW (ref 22–32)
Calcium: 7.2 mg/dL — ABNORMAL LOW (ref 8.9–10.3)
Chloride: 106 mmol/L (ref 98–111)
Creatinine, Ser: 4.55 mg/dL — ABNORMAL HIGH (ref 0.44–1.00)
GFR, Estimated: 13 mL/min — ABNORMAL LOW (ref >60)
Glucose, Bld: 90 mg/dL (ref 70–99)
Phosphorus: 9.6 mg/dL — ABNORMAL HIGH (ref 2.5–4.6)
Potassium: 5.1 mmol/L (ref 3.5–5.1)
Sodium: 136 mmol/L (ref 135–145)

## 2022-02-19 LAB — CBC
HCT: 25.2 % — ABNORMAL LOW (ref 36.0–46.0)
Hemoglobin: 8.1 g/dL — ABNORMAL LOW (ref 12.0–15.0)
MCH: 23.7 pg — ABNORMAL LOW (ref 26.0–34.0)
MCHC: 32.1 g/dL (ref 30.0–36.0)
MCV: 73.7 fL — ABNORMAL LOW (ref 80.0–100.0)
Platelets: 518 10*3/uL — ABNORMAL HIGH (ref 150–400)
RBC: 3.42 MIL/uL — ABNORMAL LOW (ref 3.87–5.11)
RDW: 23.1 % — ABNORMAL HIGH (ref 11.5–15.5)
WBC: 16.7 10*3/uL — ABNORMAL HIGH (ref 4.0–10.5)
nRBC: 0 % (ref 0.0–0.2)

## 2022-02-19 LAB — GLUCOSE, CAPILLARY
Glucose-Capillary: 87 mg/dL (ref 70–99)
Glucose-Capillary: 116 mg/dL — ABNORMAL HIGH (ref 70–99)
Glucose-Capillary: 102 mg/dL — ABNORMAL HIGH (ref 70–99)
Glucose-Capillary: 92 mg/dL (ref 70–99)

## 2022-02-19 MED ORDER — HYDROMORPHONE HCL 1 MG/ML IJ SOLN: 0.5000 mg | Freq: Three times a day (TID) | INTRAMUSCULAR | Status: DC | PRN

## 2022-02-19 MED ORDER — LINEZOLID 600 MG PO TABS: 600.0000 mg | ORAL_TABLET | Freq: Two times a day (BID) | ORAL | Status: DC

## 2022-02-19 NOTE — Progress Notes (Signed)
Patient ID: Carol Rivera, female   DOB: 05-10-1991, 31 y.o.   MRN: [id number] [facility] Progress Note   Assessment/ Plan:   1.  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 Would not do a biopsy as this will not change her overall management which is to treat the underlying infection (immunosuppression not indicated in such settings). -started HD given worsening kidney function, borderline UOP, persistent hyperkalemia. S/p RIJ temp line 5/29 with IR. HD #1 on 5/30, #2 5/31 (slow start protocol) -#3 treatment planned for Thursday -if no improvement in kidney function (no evidence of recovery), then will need to consider converting her temp line to a [facility] -monitor strict I/O 2.  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 S/p I&D of left AC septic arthritis on 5/22. On daptomycin and ceftaroline. Repeat bcx on 5/25 neg 3.  Anemia: Likely secondary to acute/critical illness. Possible hemolytic anemia especially on Teflaro.Elevated LDH with negative direct Coombs test and pending haptoglobin. Transfuse prn, avoid IV iron, hgb stable today. Will check iron stores and anticipate initiation of ESAs 4.  Anasarca/hypoalbuminemia: with significant proteinruia (nephrotic range from staph assc'd GN). Push protein. Will UF as tolerated 5.  Hyperkalemia: Secondary to acute kidney injury, starting HD, K improving.   Subjective:   Patient seen and examined, no acute events. Tolerated HD yesterday with the exception of clotting the machine. Ended treatment about 30 min earlier than anticipated. She is a little more awake for me today. She denies any new issues and she does  report that dialysis went okay yesterday.    Objective:   BP 111/67 (BP Location: Left Arm)   Pulse 98   Temp 98.9 F (37.2 C) (Oral)   Resp 20   Ht 5' (1.524 m)   Wt 72.7 kg   SpO2 93%   BMI 31.30 kg/m   Intake/Output Summary (Last 24 hours) at 02/19/2022 1119 Last data filed at 02/19/2022 [id number] Gross per 24 hour  Intake 1195.76 ml  Output --  Net 1195.76 ml   Weight change: -3.9 kg  Physical Exam: Gen: NAD, laying flat in bed CVS: Pulse regular rhythm, normal rate, 3/6 holosystolic murmur Resp:  normal WOB Abd: Soft, flat, nontender, bowel sounds normal Ext: Trace lower extremity/upper extremity edema Dialysis access: RIJ temp line  Imaging: No results found.  Labs: BMET Recent Labs  Lab 02/15/22 0446 02/16/22 0509 02/16/22 1601 02/17/22 0055 02/17/22 0702 02/18/22 0144 02/19/22 0047  NA 137 136 136 138 135 136 136  K 5.1 5.8* 6.2* 6.5* 5.9* 5.2* 5.1  CL 111 109 107 110 107 106 106  CO2 19* 19* 18* 17* 18* 19* 21*  GLUCOSE 102* 94 86 91 87 90 90  BUN 53* 66* 71* 77* 80* 71* 67*  CREATININE 3.05* 3.92* 4.52* 4.49* 4.70* 4.54* 4.55*  CALCIUM 7.2* 7.2* 7.5* 7.6* 8.0* 7.1* 7.2*  PHOS 7.1* 9.0* 9.9* 10.2* 10.5* 9.7* 9.6*   CBC Recent Labs  Lab 02/17/22 0055 02/17/22 0702 02/18/22 0144 02/19/22 0047  WBC 17.6* 17.6* 16.1* 16.7*  HGB 8.4* 8.6* 8.4* 8.1*  HCT 26.2* 25.8* 25.1* 25.2*  MCV 74.6* 72.5* 72.3* 73.7*  PLT 491* 574* 537* 518*    Medications:     acetaminophen  1,000 mg Oral Q6H   bisacodyl  10 mg Oral Daily   Chlorhexidine Gluconate Cloth  6 each Topical Q0600   Chlorhexidine Gluconate Cloth  6 each Topical Q0600   clonazePAM  0.5 mg Oral BID   docusate sodium  100 mg Oral BID   metoprolol tartrate  12.5 mg Oral BID   mupirocin ointment   Nasal BID   nicotine  14 mg Transdermal Daily   QUEtiapine  25 mg Oral QHS   sodium chloride flush  10-40 mL Intracatheter Q12H   sodium chloride flush  3 mL Intravenous Q12H   sodium polystyrene   30 g Rectal Once   Gean Quint, MD [facility] 02/19/2022, 11:19 AM

## 2022-02-19 NOTE — Progress Notes (Signed)
PROGRESS NOTE    Carol OZBUN  Rivera:[id number] DOB: [birth year] DOA: 02/06/2022 PCP: Oneita Hurt, No   Brief Narrative:  Carol Rivera is a 31 y.o. female with a history of IVDU who presented 5/18 with fever, cough, chest pain and left shoulder pain. She was found to have septic shock due to MRSA bacteremia as well as large destructive TV endocarditis with TR. IV antibiotics were continued and tailored to cultures which were persistently positive until 5/25 which have remain NGTD. CT surgery stated the location (ventricular side) of the vegetations is not accessible by angiovac and she is not a candidate for valve replacement. IV daptomycin and ceftaroline have been continued per ID recommendations. Course complicated by left AC septic joint s/p I&D 5/22, anemia of acute illness s/p 1u PRBCs 5/27, and renal failure with anasarca for which dialysis will be initiated through right IJ TDC placed 02/17/2022.   Assessment & Plan:   Principal Problem:   MRSA bacteremia Active Problems:   Sepsis (HCC)   Hyponatremia   Elevated LFTs   IVDU (intravenous drug user)   Left shoulder pain   Chest pain, pleuritic   Septic pulmonary embolism without acute cor pulmonale (HCC)   Endocarditis of tricuspid valve   Staphylococcal arthritis of left shoulder (HCC)   Osteomyelitis (HCC)   Normocytic anemia   Microcytic anemia   Hypoalbuminemia   AKI (acute kidney injury) (HCC)   Septic arthritis of AC joint (HCC)  MRSA bacteremia with multifocal infections(multiple areas osteomyelitis/septic arthritis), septic pulmonary emboli, tricuspid valve endocarditis with TR: CT surgery eval 5/22 not accessible via angiovac, and not a candidate for valve replacement given recent IV drug abuse and valve destruction.  - TEE 5/23: large mobile TV posterior leaflet vegetation with destruction of the leaflet and "torrential tricuspid insufficiency." -No current indication for intervention per discussion with cardiology -  Blood cultures were persistently positive, though 5/25 set is NGTD.  - Daptomycin, teflaro per ID. Dosing per pharmacy   Anemia of acute illness:  - H/H stable s/p 1u PRBCs 5/27. No active bleeding, Coombs negative.   Acute renal failure: With hyperkalemia, metabolic acidosis, declining urine output, suspect she may require HD for which nephrology has consulted IR for access. Renal U/S with increased echogenicity, no hydro. - IR placed right IJ [facility] 5/29, had 1st HD same day, 2nd 5/30 scheduled. Appreciate nephrology assistance. - Monitor UOP.   Hyperkalemia, resolved:  -Managed with dialysis.   Wheezing: Resolved. - Continue albuterol prn -Likely secondary to volume overload given above   T11 osteomyelitis: demonstrating T2-hyperintensity, suspect new osteomyelitis. Dr. Maurice Small, neurosurgery evaluated the patient on 5/26, recommending continuation of antibiotics without acute neurosurgical intervention. +clonus on L > R so could consider repeat MR brain given her risk of septic cerebral emboli.  -IV narcotics discontinued, continue to titrate p.o. medications as appropriate   Left AC septic arthritis: s/p I&D 5/22.  - Wound care per orthopedics. Will need suture removal ~10 day postoperatively per orthopedics.   IVDU:  - Cessation counseling provided.    Acute metabolic encephalopathy: Improved from admission.  - Continue supportive seroquel and clonazepam.    Obesity: Estimated body mass index is 32.29 kg/m as calculated from the following:   Height as of this encounter: 5' (1.524 m).   Weight as of this encounter: 75 kg.  DVT prophylaxis: Early ambulation Code Status: Full Family Communication: None present  Status is: Inpatient  Dispo: The patient is from: Home  Anticipated d/c is to: Home              Anticipated d/c date is: Pending completion of antibiotics as above -likely require 6 weeks of IV antibiotics in total              Patient currently not  medically stable for discharge  Consultants:  ID, orthopedic surgery, nephrology  Antimicrobials:  Daptomycin/ceftaroline per ID  Subjective: No acute issues or events overnight denies nausea vomiting diarrhea constipation headache fevers chills or chest pain  Objective: Vitals:   02/18/22 1932 02/18/22 2309 02/19/22 0117 02/19/22 0308  BP: 117/86 91/62    Pulse: 92 87    Resp: 19 20  (P) 20  Temp: 98 F (36.7 C) 97.8 F (36.6 C)  (P) 97.8 F (36.6 C)  TempSrc: Oral Axillary  (P) Oral  SpO2: 93% 91%    Weight:   72.7 kg   Height:        Intake/Output Summary (Last 24 hours) at 02/19/2022 0731 Last data filed at 02/19/2022 0622 Gross per 24 hour  Intake 1195.76 ml  Output --  Net 1195.76 ml   Filed Weights   02/17/22 1620 02/18/22 0939 02/19/22 0117  Weight: 75 kg 73.1 kg 72.7 kg    Examination:  General:  Pleasantly resting in bed, No acute distress. HEENT:  Normocephalic atraumatic.  Sclerae nonicteric, noninjected.  Extraocular movements intact bilaterally. Neck: Right IJ hemodialysis catheter. Lungs:  Clear to auscultate bilaterally without rhonchi, wheeze, or rales. Heart:  Regular rate and rhythm.  Without murmurs, rubs, or gallops. Abdomen:  Soft, nontender, nondistended.  Without guarding or rebound. Extremities: Limited range of motion left upper extremity  Vascular:  Dorsalis pedis and posterior tibial pulses palpable bilaterally.  Data Reviewed: I have personally reviewed following labs and imaging studies  CBC: Recent Labs  Lab 02/16/22 0509 02/17/22 0055 02/17/22 0702 02/18/22 0144 02/19/22 0047  WBC 17.6* 17.6* 17.6* 16.1* 16.7*  HGB 7.7* 8.4* 8.6* 8.4* 8.1*  HCT 23.5* 26.2* 25.8* 25.1* 25.2*  MCV 72.8* 74.6* 72.5* 72.3* 73.7*  PLT 410* 491* 574* 537* 518*   Basic Metabolic Panel: Recent Labs  Lab 02/13/22 0119 02/14/22 0551 02/15/22 0446 02/16/22 0509 02/16/22 1601 02/17/22 0055 02/17/22 0702 02/18/22 0144 02/19/22 0047  NA 136  136 137   < > 136 138 135 136 136  K 3.7 4.4 5.1   < > 6.2* 6.5* 5.9* 5.2* 5.1  CL 110 108 111   < > 107 110 107 106 106  CO2 20* 21* 19*   < > 18* 17* 18* 19* 21*  GLUCOSE 108* 101* 102*   < > 86 91 87 90 90  BUN 24* 37* 53*   < > 71* 77* 80* 71* 67*  CREATININE 1.12* 2.06* 3.05*   < > 4.52* 4.49* 4.70* 4.54* 4.55*  CALCIUM 6.9* 7.1* 7.2*   < > 7.5* 7.6* 8.0* 7.1* 7.2*  MG 2.1 2.0 2.1  --   --   --   --   --   --   PHOS  --   --  7.1*   < > 9.9* 10.2* 10.5* 9.7* 9.6*   < > = values in this interval not displayed.   GFR: Estimated Creatinine Clearance: 16 mL/min (A) (by C-G formula based on SCr of 4.55 mg/dL (H)). Liver Function Tests: Recent Labs  Lab 02/13/22 0119 02/14/22 1610 02/15/22 9604 02/16/22 5409 02/16/22 1601 02/17/22 8119 02/17/22 1478 02/18/22 0144 02/19/22 2956  AST 27 23  --  18  --   --   --   --   --   ALT 28 22  --  16  --   --   --   --   --   ALKPHOS 102 101  --  78  --   --   --   --   --   BILITOT 1.4* 1.4*  --  1.5*  --   --   --   --   --   PROT 5.1* 5.3*  --  5.7*  --   --   --   --   --   ALBUMIN <1.5* <1.5*   < > 1.9*  1.9* 2.2* 2.1* 1.9* 1.6* 1.6*   < > = values in this interval not displayed.   No results for input(s): LIPASE, AMYLASE in the last 168 hours. No results for input(s): AMMONIA in the last 168 hours. Coagulation Profile: No results for input(s): INR, PROTIME in the last 168 hours. Cardiac Enzymes: Recent Labs  Lab 02/16/22 0509  CKTOTAL 23*   BNP (last 3 results) No results for input(s): PROBNP in the last 8760 hours. HbA1C: No results for input(s): HGBA1C in the last 72 hours. CBG: Recent Labs  Lab 02/17/22 2054 02/18/22 0741 02/18/22 1349 02/18/22 1540 02/18/22 2126  GLUCAP 92 102* 111* 107* 103*   Lipid Profile: No results for input(s): CHOL, HDL, LDLCALC, TRIG, CHOLHDL, LDLDIRECT in the last 72 hours. Thyroid Function Tests: No results for input(s): TSH, T4TOTAL, FREET4, T3FREE, THYROIDAB in the last 72  hours. Anemia Panel: No results for input(s): VITAMINB12, FOLATE, FERRITIN, TIBC, IRON, RETICCTPCT in the last 72 hours. Sepsis Labs: No results for input(s): PROCALCITON, LATICACIDVEN in the last 168 hours.  Recent Results (from the past 240 hour(s))  Culture, blood (Routine X 2) w Reflex to ID Panel     Status: Abnormal   Collection Time: 02/10/22  6:43 AM   Specimen: BLOOD  Result Value Ref Range Status   Specimen Description BLOOD LEFT ANTECUBITAL  Final   Special Requests   Final    BOTTLES DRAWN AEROBIC AND ANAEROBIC Blood Culture adequate volume   Culture  Setup Time   Final    GRAM POSITIVE COCCI IN CLUSTERS IN BOTH AEROBIC AND ANAEROBIC BOTTLES CRITICAL RESULT CALLED TO, READ BACK BY AND VERIFIED WITH: Tandy Gaw 1403 [id number] FCP Performed at [facility], [address], [place], [place] [address]    Culture METHICILLIN RESISTANT STAPHYLOCOCCUS AUREUS (A)  Final   Report Status 02/14/2022 FINAL  Final   Organism ID, Bacteria METHICILLIN RESISTANT STAPHYLOCOCCUS AUREUS  Final      Susceptibility   Methicillin resistant staphylococcus aureus - MIC*    CIPROFLOXACIN >=8 RESISTANT Resistant     ERYTHROMYCIN >=8 RESISTANT Resistant     GENTAMICIN <=0.5 SENSITIVE Sensitive     OXACILLIN >=4 RESISTANT Resistant     TETRACYCLINE <=1 SENSITIVE Sensitive     VANCOMYCIN 1 SENSITIVE Sensitive     TRIMETH/SULFA >=320 RESISTANT Resistant     CLINDAMYCIN <=0.25 SENSITIVE Sensitive     RIFAMPIN <=0.5 SENSITIVE Sensitive     Inducible Clindamycin NEGATIVE Sensitive     * METHICILLIN RESISTANT STAPHYLOCOCCUS AUREUS  Blood Culture ID Panel (Reflexed)     Status: Abnormal   Collection Time: 02/10/22  6:43 AM  Result Value Ref Range Status   Enterococcus faecalis NOT DETECTED NOT DETECTED Final   Enterococcus Faecium NOT DETECTED  NOT DETECTED Final   Listeria monocytogenes NOT DETECTED NOT DETECTED Final   Staphylococcus species DETECTED (A) NOT DETECTED Final    Comment:  CRITICAL RESULT CALLED TO, READ BACK BY AND VERIFIED WITH: PHARMD RACHEL S 1403 [id number] FCP    Staphylococcus aureus (BCID) DETECTED (A) NOT DETECTED Final    Comment: Methicillin (oxacillin)-resistant Staphylococcus aureus (MRSA). MRSA is predictably resistant to beta-lactam antibiotics (except ceftaroline). Preferred therapy is vancomycin unless clinically contraindicated. Patient requires contact precautions if  hospitalized. CRITICAL RESULT CALLED TO, READ BACK BY AND VERIFIED WITH: PHARMD RACHEL S 1403 [id number] FCP    Staphylococcus epidermidis NOT DETECTED NOT DETECTED Final   Staphylococcus lugdunensis NOT DETECTED NOT DETECTED Final   Streptococcus species NOT DETECTED NOT DETECTED Final   Streptococcus agalactiae NOT DETECTED NOT DETECTED Final   Streptococcus pneumoniae NOT DETECTED NOT DETECTED Final   Streptococcus pyogenes NOT DETECTED NOT DETECTED Final   A.calcoaceticus-baumannii NOT DETECTED NOT DETECTED Final   Bacteroides fragilis NOT DETECTED NOT DETECTED Final   Enterobacterales NOT DETECTED NOT DETECTED Final   Enterobacter cloacae complex NOT DETECTED NOT DETECTED Final   Escherichia coli NOT DETECTED NOT DETECTED Final   Klebsiella aerogenes NOT DETECTED NOT DETECTED Final   Klebsiella oxytoca NOT DETECTED NOT DETECTED Final   Klebsiella pneumoniae NOT DETECTED NOT DETECTED Final   Proteus species NOT DETECTED NOT DETECTED Final   Salmonella species NOT DETECTED NOT DETECTED Final   Serratia marcescens NOT DETECTED NOT DETECTED Final   Haemophilus influenzae NOT DETECTED NOT DETECTED Final   Neisseria meningitidis NOT DETECTED NOT DETECTED Final   Pseudomonas aeruginosa NOT DETECTED NOT DETECTED Final   Stenotrophomonas maltophilia NOT DETECTED NOT DETECTED Final   Candida albicans NOT DETECTED NOT DETECTED Final   Candida auris NOT DETECTED NOT DETECTED Final   Candida glabrata NOT DETECTED NOT DETECTED Final   Candida krusei NOT DETECTED NOT DETECTED Final    Candida parapsilosis NOT DETECTED NOT DETECTED Final   Candida tropicalis NOT DETECTED NOT DETECTED Final   Cryptococcus neoformans/gattii NOT DETECTED NOT DETECTED Final   Meth resistant mecA/C and MREJ DETECTED (A) NOT DETECTED Final    Comment: CRITICAL RESULT CALLED TO, READ BACK BY AND VERIFIED WITH: Gordan PaymentHARM RACHEL S 1403 [id number] FCP Performed at [facility], [address] [place], [place] [address]   Culture, blood (Routine X 2) w Reflex to ID Panel     Status: Abnormal   Collection Time: 02/10/22  6:53 AM   Specimen: BLOOD  Result Value Ref Range Status   Specimen Description BLOOD LEFT ANTECUBITAL  Final   Special Requests   Final    BOTTLES DRAWN AEROBIC AND ANAEROBIC Blood Culture adequate volume   Culture  Setup Time   Final    GRAM POSITIVE COCCI IN CLUSTERS IN BOTH AEROBIC AND ANAEROBIC BOTTLES CRITICAL RESULT CALLED TO, READ BACK BY AND VERIFIED WITH: PHARMD EDEN BREWINGTON ON 02/13/22 @ 1538 BY DRT    Culture (A)  Final    STAPHYLOCOCCUS AUREUS SUSCEPTIBILITIES PERFORMED ON PREVIOUS CULTURE WITHIN THE LAST 5 DAYS. Performed at [facility], [address] [place], [place] [address]    Report Status 02/15/2022 FINAL  Final  Surgical PCR screen     Status: Abnormal   Collection Time: 02/10/22  9:22 AM   Specimen: Nasal Mucosa; Nasal Swab  Result Value Ref Range Status   MRSA, PCR POSITIVE (A) NEGATIVE Final    Comment: RESULT CALLED TO, READ BACK BY AND VERIFIED  WITH: OLVIA MARTIN   DL    Staphylococcus aureus POSITIVE (A) NEGATIVE Final    Comment: (NOTE) The Xpert SA Assay (FDA approved for NASAL specimens in patients 9 years of age and older), is one component of a comprehensive surveillance program. It is not intended to diagnose infection nor to guide or monitor treatment. Performed at [facility], [address] [place], [place] [address]   Aerobic/Anaerobic Culture w Gram Stain (surgical/deep wound)     Status: None    Collection Time: 02/10/22 11:31 AM   Specimen: PATH Other; Tissue  Result Value Ref Range Status   Specimen Description WOUND  Final   Special Requests LEFT AC JOINT  Final   Gram Stain   Final    RARE WBC PRESENT,BOTH PMN AND MONONUCLEAR RARE GRAM POSITIVE COCCI IN CLUSTERS    Culture   Final    FEW METHICILLIN RESISTANT STAPHYLOCOCCUS AUREUS NO ANAEROBES ISOLATED Performed at [facility], [address] [place], [place] [address]    Report Status 02/15/2022 FINAL  Final   Organism ID, Bacteria METHICILLIN RESISTANT STAPHYLOCOCCUS AUREUS  Final      Susceptibility   Methicillin resistant staphylococcus aureus - MIC*    CIPROFLOXACIN >=8 RESISTANT Resistant     ERYTHROMYCIN >=8 RESISTANT Resistant     GENTAMICIN <=0.5 SENSITIVE Sensitive     OXACILLIN >=4 RESISTANT Resistant     TETRACYCLINE <=1 SENSITIVE Sensitive     VANCOMYCIN <=0.5 SENSITIVE Sensitive     TRIMETH/SULFA >=320 RESISTANT Resistant     CLINDAMYCIN <=0.25 SENSITIVE Sensitive     RIFAMPIN <=0.5 SENSITIVE Sensitive     Inducible Clindamycin NEGATIVE Sensitive     * FEW METHICILLIN RESISTANT STAPHYLOCOCCUS AUREUS  Culture, blood (Routine X 2) w Reflex to ID Panel     Status: None   Collection Time: 02/13/22  1:19 AM   Specimen: BLOOD LEFT HAND  Result Value Ref Range Status   Specimen Description BLOOD LEFT HAND  Final   Special Requests AEROBIC BOTTLE ONLY Blood Culture adequate volume  Final   Culture   Final    NO GROWTH 5 DAYS Performed at [facility], [address], [place], [place] [address]    Report Status 02/18/2022 FINAL  Final  Culture, blood (Routine X 2) w Reflex to ID Panel     Status: None   Collection Time: 02/13/22  1:38 AM   Specimen: BLOOD RIGHT HAND  Result Value Ref Range Status   Specimen Description BLOOD RIGHT HAND  Final   Special Requests   Final    BOTTLES DRAWN AEROBIC AND ANAEROBIC Blood Culture adequate volume   Culture   Final    NO GROWTH 5 DAYS Performed at  [facility], [address], [place], [place] [address]    Report Status 02/18/2022 FINAL  Final         Radiology Studies: IR Fluoro Guide CV Line Right  Result Date: 02/17/2022 INDICATION: Need for dialysis access EXAM: 1. Ultrasound-guided puncture of the right internal jugular vein 2. Placement of a temporary hemodialysis catheter using fluoroscopic guidance MEDICATIONS: None ANESTHESIA/SEDATION: Local analgesia FLUOROSCOPY TIME:  Fluoroscopy Time: 0.2 minutes (1 mGy) COMPLICATIONS: None immediate. PROCEDURE: Informed written consent was obtained from the patient after a thorough discussion of the procedural risks, benefits and alternatives. All questions were addressed. Maximal Sterile Barrier Technique was utilized including caps, mask, sterile gowns, sterile gloves, sterile drape, hand hygiene and skin antiseptic. A  timeout was performed prior to the initiation of the procedure. The patient was placed supine on the exam table. The right neck and chest was prepped and draped in the standard sterile fashion. Ultrasound was used to evaluate the right internal jugular vein, which was found to be widely patent. A permanent image was stored in the electronic medical record. Using ultrasound guidance, the right internal jugular vein was directly punctured using a 21 gauge micropuncture set. Access site was serially dilated to accommodate an 035 wire, which was advanced into the IVC. Subsequently, serial tract dilation was performed and a 16 cm temporary triple-lumen hemodialysis catheter was advanced into the central veins, such that the tip overlies the superior cavoatrial junction. The catheter was found to aspirate and flush appropriately. It was locked with the appropriate volume of heparinized saline. It was secured to the skin using silk suture and a sterile dressing. The patient tolerated the procedure well without immediate complication. IMPRESSION: Successful placement of a temporary  triple-lumen hemodialysis catheter via the right internal jugular vein using ultrasound and fluoroscopic guidance. The line is ready for immediate use. Electronically Signed   By: Olive Bass M.D.   On: 02/17/2022 09:28   IR US Guide Vasc Access Right  Result Date: 02/17/2022 INDICATION: Need for dialysis access EXAM: 1. Ultrasound-guided puncture of the right internal jugular vein 2. Placement of a temporary hemodialysis catheter using fluoroscopic guidance MEDICATIONS: None ANESTHESIA/SEDATION: Local analgesia FLUOROSCOPY TIME:  Fluoroscopy Time: 0.2 minutes (1 mGy) COMPLICATIONS: None immediate. PROCEDURE: Informed written consent was obtained from the patient after a thorough discussion of the procedural risks, benefits and alternatives. All questions were addressed. Maximal Sterile Barrier Technique was utilized including caps, mask, sterile gowns, sterile gloves, sterile drape, hand hygiene and skin antiseptic. A timeout was performed prior to the initiation of the procedure. The patient was placed supine on the exam table. The right neck and chest was prepped and draped in the standard sterile fashion. Ultrasound was used to evaluate the right internal jugular vein, which was found to be widely patent. A permanent image was stored in the electronic medical record. Using ultrasound guidance, the right internal jugular vein was directly punctured using a 21 gauge micropuncture set. Access site was serially dilated to accommodate an 035 wire, which was advanced into the IVC. Subsequently, serial tract dilation was performed and a 16 cm temporary triple-lumen hemodialysis catheter was advanced into the central veins, such that the tip overlies the superior cavoatrial junction. The catheter was found to aspirate and flush appropriately. It was locked with the appropriate volume of heparinized saline. It was secured to the skin using silk suture and a sterile dressing. The patient tolerated the procedure  well without immediate complication. IMPRESSION: Successful placement of a temporary triple-lumen hemodialysis catheter via the right internal jugular vein using ultrasound and fluoroscopic guidance. The line is ready for immediate use. Electronically Signed   By: Olive Bass M.D.   On: 02/17/2022 09:28        Scheduled Meds:  acetaminophen  1,000 mg Oral Q6H   bisacodyl  10 mg Oral Daily   Chlorhexidine Gluconate Cloth  6 each Topical Q0600   Chlorhexidine Gluconate Cloth  6 each Topical Q0600   clonazePAM  0.5 mg Oral BID   docusate sodium  100 mg Oral BID   metoprolol tartrate  12.5 mg Oral BID   mupirocin ointment   Nasal BID   nicotine  14 mg Transdermal Daily   QUEtiapine  25  mg Oral QHS   sodium chloride flush  10-40 mL Intracatheter Q12H   sodium chloride flush  3 mL Intravenous Q12H   sodium polystyrene  30 g Rectal Once   Continuous Infusions:  ceFTAROline (TEFLARO) IV 106.7 mL/hr at 02/19/22 0622   DAPTOmycin (CUBICIN)  IV Stopped (02/18/22 1929)   methocarbamol (ROBAXIN) IV      LOS: 13 days   Time spent:  Azucena Fallen, DO [facility]  If 7PM-7AM, please contact night-coverage [email or web address]  02/19/2022, 7:31 AM

## 2022-02-19 NOTE — Progress Notes (Addendum)
[facility]  Date of Admission:  02/06/2022   Total days of inpatient antibiotics 14  Principal Problem:   MRSA bacteremia Active Problems:   Sepsis (HCC)   Hyponatremia   Elevated LFTs   IVDU (intravenous drug user)   Left shoulder pain   Chest pain, pleuritic   Septic pulmonary embolism without acute cor pulmonale (HCC)   Endocarditis of tricuspid valve   Staphylococcal arthritis of left shoulder (HCC)   Osteomyelitis (HCC)   Normocytic anemia   Microcytic anemia   Hypoalbuminemia   AKI (acute kidney injury) (HCC)   Septic arthritis of AC joint (HCC)          Assessment: 31 YO female with MRSA bacteremia  #MRSA bacteremia with TV vegetation with perforation with mets to septic left AC s/p excision + I&D 5/22 with MRSA,  T11 early osteomyelitis and septic emboli to lungs #Persistent MRSA bacteremia-cleared #IVDA #AKI SP HD -TTE showed TV vegetation. MR T spine on 5/25 showed new area of marrow edema at T11 -CTS (5/22) noted that given recent  IVDA Hx not operative candidate for valve replacement, Vegetation non ventricular side and not accessible to angiovac -TEE on 5/23 showed TV valve  vegetation 23x5cm and perforated posterior tricuspid leaflet.  Recommendations: -Continue Daptomycin and ceftaroline -DC ceftaroline on 6/8(14 day course after negative Cx) -Continue daptomycin x 6 weeks from negative Cx EOT 03/27/22 then transition to linezolid on discharge to complete 8 weeks of treatment 04/10/22 -Not a home IV abx candidate.  -Would appreciate CTS input per perforation on posterior leaflet.   #HCV infection -HCV RNA 3000 -Follow-up in outpatient ID clinic to evaluate for clearance vs treatment Microbiology:   Antibiotics:  Daptomycin + Ceftaroline 5/21 -p  Cefepime 5/18 Metronidazole 5/18, 5/19 Vancomycin 5/18-5/22 Cultures: Blood 5/18 MRSA 5/19 MRSA 5/21 MRSA 5/24 NG Urine 5/18 MRSA  >100,000    SUBJECTIVE: Resting in bed. No new complaints.  Interval: Wbc 16.7, Afebrile   Review of Systems: Review of Systems  All other systems reviewed and are negative.   Scheduled Meds:  acetaminophen  1,000 mg Oral Q6H   bisacodyl  10 mg Oral Daily   Chlorhexidine Gluconate Cloth  6 each Topical Q0600   Chlorhexidine Gluconate Cloth  6 each Topical Q0600   clonazePAM  0.5 mg Oral BID   docusate sodium  100 mg Oral BID   metoprolol tartrate  12.5 mg Oral BID   mupirocin ointment   Nasal BID   nicotine  14 mg Transdermal Daily   QUEtiapine  25 mg Oral QHS   sodium chloride flush  10-40 mL Intracatheter Q12H   sodium chloride flush  3 mL Intravenous Q12H   sodium polystyrene  30 g Rectal Once   Continuous Infusions:  ceFTAROline (TEFLARO) IV 106.7 mL/hr at 02/19/22 0622   DAPTOmycin (CUBICIN)  IV Stopped (02/18/22 1929)   methocarbamol (ROBAXIN) IV     PRN Meds:.albuterol, ALPRAZolam, guaiFENesin, HYDROmorphone (DILAUDID) injection, methocarbamol **OR** methocarbamol (ROBAXIN) IV, metoCLOPramide **OR** metoCLOPramide (REGLAN) injection, ondansetron (ZOFRAN) IV, ondansetron **OR** ondansetron (ZOFRAN) IV, oxyCODONE, phenol, sodium chloride flush Allergies  Allergen Reactions   Doxycycline Other (See Comments)    REACTION: Joint Pain    OBJECTIVE: Vitals:   02/19/22 0117 02/19/22 0308 02/19/22 0800 02/19/22 1208  BP:   111/67 (!) 102/58  Pulse:   98 84  Resp:  (P) 20 20 20   Temp:  (P) 97.8 F (36.6 C) 98.9 F (  37.2 C) 97.8 F (36.6 C)  TempSrc:  (P) Oral Oral Oral  SpO2:   93% 94%  Weight: 72.7 kg     Height:       Body mass index is 31.3 kg/m.  Physical Exam Constitutional:      Appearance: Normal appearance.  HENT:     Head: Normocephalic and atraumatic.     Right Ear: Tympanic membrane normal.     Left Ear: Tympanic membrane normal.     Nose: Nose normal.     Mouth/Throat:     Mouth: Mucous membranes are moist.  Eyes:     Extraocular  Movements: Extraocular movements intact.     Conjunctiva/sclera: Conjunctivae normal.     Pupils: Pupils are equal, round, and reactive to light.  Cardiovascular:     Rate and Rhythm: Normal rate and regular rhythm.     Heart sounds: No murmur heard.   No friction rub. No gallop.  Pulmonary:     Effort: Pulmonary effort is normal.     Breath sounds: Normal breath sounds.  Abdominal:     General: Abdomen is flat.     Palpations: Abdomen is soft.  Musculoskeletal:        General: Normal range of motion.  Skin:    General: Skin is warm and dry.  Neurological:     General: No focal deficit present.     Mental Status: She is alert and oriented to person, place, and time.  Psychiatric:        Mood and Affect: Mood normal.      Lab Results Lab Results  Component Value Date   WBC 16.7 (H) 02/19/2022   HGB 8.1 (L) 02/19/2022   HCT 25.2 (L) 02/19/2022   MCV 73.7 (L) 02/19/2022   PLT 518 (H) 02/19/2022    Lab Results  Component Value Date   CREATININE 4.55 (H) 02/19/2022   BUN 67 (H) 02/19/2022   NA 136 02/19/2022   K 5.1 02/19/2022   CL 106 02/19/2022   CO2 21 (L) 02/19/2022    Lab Results  Component Value Date   ALT 16 02/16/2022   AST 18 02/16/2022   ALKPHOS 78 02/16/2022   BILITOT 1.5 (H) 02/16/2022        Danelle Earthly, MD [facility] 02/19/2022, 12:56 PM

## 2022-02-20 ENCOUNTER — Other Ambulatory Visit (HOSPITAL_COMMUNITY): Payer: Self-pay

## 2022-02-20 DIAGNOSIS — B9562 Methicillin resistant Staphylococcus aureus infection as the cause of diseases classified elsewhere: Secondary | ICD-10-CM | POA: Diagnosis not present

## 2022-02-20 DIAGNOSIS — R7881 Bacteremia: Secondary | ICD-10-CM | POA: Diagnosis not present

## 2022-02-20 LAB — IRON AND TIBC
Iron: 7 ug/dL — ABNORMAL LOW (ref 28–170)
Saturation Ratios: 6 % — ABNORMAL LOW (ref 10.4–31.8)
TIBC: 118 ug/dL — ABNORMAL LOW (ref 250–450)
UIBC: 111 ug/dL

## 2022-02-20 LAB — GLUCOSE, CAPILLARY
Glucose-Capillary: 89 mg/dL (ref 70–99)
Glucose-Capillary: 96 mg/dL (ref 70–99)
Glucose-Capillary: 91 mg/dL (ref 70–99)

## 2022-02-20 LAB — RENAL FUNCTION PANEL
Albumin: 1.5 g/dL — ABNORMAL LOW (ref 3.5–5.0)
Anion gap: 11 (ref 5–15)
BUN: 81 mg/dL — ABNORMAL HIGH (ref 6–20)
CO2: 21 mmol/L — ABNORMAL LOW (ref 22–32)
Calcium: 7.3 mg/dL — ABNORMAL LOW (ref 8.9–10.3)
Chloride: 104 mmol/L (ref 98–111)
Creatinine, Ser: 5.33 mg/dL — ABNORMAL HIGH (ref 0.44–1.00)
GFR, Estimated: 10 mL/min — ABNORMAL LOW (ref >60)
Glucose, Bld: 91 mg/dL (ref 70–99)
Phosphorus: 10.1 mg/dL — ABNORMAL HIGH (ref 2.5–4.6)
Potassium: 5.3 mmol/L — ABNORMAL HIGH (ref 3.5–5.1)
Sodium: 136 mmol/L (ref 135–145)

## 2022-02-20 LAB — FERRITIN: Ferritin: 310 ng/mL — ABNORMAL HIGH (ref 11–307)

## 2022-02-20 MED ORDER — SEVELAMER CARBONATE 800 MG PO TABS
1600.0000 mg | ORAL_TABLET | Freq: Three times a day (TID) | ORAL | Status: DC
  Administered 2022-02-20 – 2022-02-26 (×16): 1600 mg via ORAL
  Filled 2022-02-20 (×16): qty 2

## 2022-02-20 MED ORDER — DARBEPOETIN ALFA 100 MCG/0.5ML IJ SOSY
100.0000 ug | PREFILLED_SYRINGE | INTRAMUSCULAR | Status: DC
  Filled 2022-02-20: qty 0.5

## 2022-02-20 MED ORDER — ALBUMIN HUMAN 25 % IV SOLN
INTRAVENOUS | Status: AC
  Administered 2022-02-20: 12.5 g
  Filled 2022-02-20: qty 100

## 2022-02-20 NOTE — Progress Notes (Signed)
PROGRESS NOTE    Carol Rivera  ZOX:[id number] DOB: June 10, 1991 DOA: 02/06/2022 PCP: Oneita Hurt, No   Brief Narrative:  Carol Rivera is a 31 y.o. female with a history of IVDU who presented 5/18 with fever, cough, chest pain and left shoulder pain. She was found to have septic shock due to MRSA bacteremia as well as large destructive TV endocarditis with TR. IV antibiotics were continued and tailored to cultures which were persistently positive until 5/25 which have remain NGTD. CT surgery stated the location (ventricular side) of the vegetations is not accessible by angiovac and she is not a candidate for valve replacement. IV daptomycin and ceftaroline have been continued per ID recommendations. Course complicated by left AC septic joint s/p I&D 5/22, anemia of acute illness s/p 1u PRBCs 5/27, and renal failure with anasarca for which dialysis will be initiated through right IJ TDC placed 02/17/2022.   Assessment & Plan:   Principal Problem:   MRSA bacteremia Active Problems:   Sepsis (HCC)   Hyponatremia   Elevated LFTs   IVDU (intravenous drug user)   Left shoulder pain   Chest pain, pleuritic   Septic pulmonary embolism without acute cor pulmonale (HCC)   Endocarditis of tricuspid valve   Staphylococcal arthritis of left shoulder (HCC)   Osteomyelitis (HCC)   Normocytic anemia   Microcytic anemia   Hypoalbuminemia   AKI (acute kidney injury) (HCC)   Septic arthritis of AC joint (HCC)  MRSA bacteremia with multifocal infections(multiple areas osteomyelitis/septic arthritis), septic pulmonary emboli, tricuspid valve endocarditis with TR: CT surgery eval 5/22 not accessible via angiovac, and not a candidate for valve replacement given recent IV drug abuse and valve destruction.  - TEE 5/23: large mobile TV posterior leaflet vegetation with destruction of the leaflet and "torrential tricuspid insufficiency." -No current indication for intervention per discussion with cardiology -  Blood cultures were persistently positive, though 5/25 set is NGTD.  - Plan to continue daptomycin x 6 weeks from negative cultures - through 03/27/22 - thereafter transition to linezolid on discharge to complete 8 weeks of treatment through 04/10/22   Anemia of acute illness:  - H/H stable s/p 1u PRBCs 5/27. No active bleeding, Coombs negative.   Acute renal failure: With hyperkalemia, metabolic acidosis, declining urine output, suspect she may require HD for which nephrology has consulted IR for access. Renal U/S with increased echogenicity, no hydro. - IR placed right IJ [facility] 5/29, had 1st HD same day, 2nd 5/30 scheduled. Appreciate nephrology assistance. - Monitor UOP.   Hyperkalemia, resolved:  -Managed with dialysis.   Wheezing: Resolved. - Continue albuterol prn -Likely secondary to volume overload given above   T11 osteomyelitis: demonstrating T2-hyperintensity, suspect new osteomyelitis. Dr. Maurice Small, neurosurgery evaluated the patient on 5/26, recommending continuation of antibiotics without acute neurosurgical intervention. +clonus on L > R so could consider repeat MR brain given her risk of septic cerebral emboli.  -IV narcotics discontinued, continue to titrate p.o. medications as appropriate   Left AC septic arthritis: s/p I&D 5/22.  - Wound care per orthopedics. Will need suture removal ~10 day postoperatively per orthopedics.   IVDU:  - Cessation counseling provided.    Acute metabolic encephalopathy: Improved from admission.  - Continue supportive seroquel and clonazepam.    Obesity: Estimated body mass index is 32.29 kg/m as calculated from the following:   Height as of this encounter: 5' (1.524 m).   Weight as of this encounter: 75 kg.  DVT prophylaxis: Early ambulation Code Status: Full  Family Communication: None present  Status is: Inpatient  Dispo: The patient is from: Home              Anticipated d/c is to: Home              Anticipated d/c date is:  Pending completion of antibiotics as above -likely require 6 weeks of IV antibiotics in total              Patient currently not medically stable for discharge  Consultants:  ID, orthopedic surgery, nephrology  Antimicrobials:  Daptomycin/ceftaroline per ID  Subjective: No acute issues or events overnight denies nausea vomiting diarrhea constipation headache fevers chills or chest pain  Objective: Vitals:   02/19/22 1539 02/19/22 2000 02/19/22 2315 02/20/22 0400  BP: 105/70 109/66 (!) 91/55 104/67  Pulse: 80 89 90 87  Resp: 20 17 20 20   Temp: 98 F (36.7 C) 98.4 F (36.9 C) 97.8 F (36.6 C) 97.8 F (36.6 C)  TempSrc: Oral Oral Oral Oral  SpO2: 97% 95% 96% 100%  Weight:    73.2 kg  Height:        Intake/Output Summary (Last 24 hours) at 02/20/2022 0801 Last data filed at 02/19/2022 1800 Gross per 24 hour  Intake 580 ml  Output --  Net 580 ml    Filed Weights   02/18/22 0939 02/19/22 0117 02/20/22 0400  Weight: 73.1 kg 72.7 kg 73.2 kg    Examination:  General:  Pleasantly resting in bed, No acute distress. HEENT:  Normocephalic atraumatic.  Sclerae nonicteric, noninjected.  Extraocular movements intact bilaterally. Neck: Right IJ hemodialysis catheter. Lungs:  Clear to auscultate bilaterally without rhonchi, wheeze, or rales. Heart:  Regular rate and rhythm.  Without murmurs, rubs, or gallops. Abdomen:  Soft, nontender, nondistended.  Without guarding or rebound. Extremities: Limited range of motion left upper extremity  Vascular:  Dorsalis pedis and posterior tibial pulses palpable bilaterally.  Data Reviewed: I have personally reviewed following labs and imaging studies  CBC: Recent Labs  Lab 02/16/22 0509 02/17/22 0055 02/17/22 0702 02/18/22 0144 02/19/22 0047  WBC 17.6* 17.6* 17.6* 16.1* 16.7*  HGB 7.7* 8.4* 8.6* 8.4* 8.1*  HCT 23.5* 26.2* 25.8* 25.1* 25.2*  MCV 72.8* 74.6* 72.5* 72.3* 73.7*  PLT 410* 491* 574* 537* 518*    Basic Metabolic  Panel: Recent Labs  Lab 02/14/22 0551 02/15/22 0446 02/16/22 0509 02/17/22 0055 02/17/22 0702 02/18/22 0144 02/19/22 0047 02/20/22 0440  NA 136 137   < > 138 135 136 136 136  K 4.4 5.1   < > 6.5* 5.9* 5.2* 5.1 5.3*  CL 108 111   < > 110 107 106 106 104  CO2 21* 19*   < > 17* 18* 19* 21* 21*  GLUCOSE 101* 102*   < > 91 87 90 90 91  BUN 37* 53*   < > 77* 80* 71* 67* 81*  CREATININE 2.06* 3.05*   < > 4.49* 4.70* 4.54* 4.55* 5.33*  CALCIUM 7.1* 7.2*   < > 7.6* 8.0* 7.1* 7.2* 7.3*  MG 2.0 2.1  --   --   --   --   --   --   PHOS  --  7.1*   < > 10.2* 10.5* 9.7* 9.6* 10.1*   < > = values in this interval not displayed.    GFR: Estimated Creatinine Clearance: 13.7 mL/min (A) (by C-G formula based on SCr of 5.33 mg/dL (H)). Liver Function Tests: Recent Labs  Lab 02/14/22  [id number] 02/15/22 0446 02/16/22 0509 02/16/22 1601 02/17/22 0055 02/17/22 0702 02/18/22 0144 02/19/22 0047 02/20/22 0440  AST 23  --  18  --   --   --   --   --   --   ALT 22  --  16  --   --   --   --   --   --   ALKPHOS 101  --  78  --   --   --   --   --   --   BILITOT 1.4*  --  1.5*  --   --   --   --   --   --   PROT 5.3*  --  5.7*  --   --   --   --   --   --   ALBUMIN <1.5*   < > 1.9*  1.9*   < > 2.1* 1.9* 1.6* 1.6* 1.5*   < > = values in this interval not displayed.    No results for input(s): LIPASE, AMYLASE in the last 168 hours. No results for input(s): AMMONIA in the last 168 hours. Coagulation Profile: No results for input(s): INR, PROTIME in the last 168 hours. Cardiac Enzymes: Recent Labs  Lab 02/16/22 0509  CKTOTAL 23*    BNP (last 3 results) No results for input(s): PROBNP in the last 8760 hours. HbA1C: No results for input(s): HGBA1C in the last 72 hours. CBG: Recent Labs  Lab 02/18/22 2126 02/19/22 0759 02/19/22 1207 02/19/22 1538 02/19/22 2205  GLUCAP 103* 87 116* 102* 92    Lipid Profile: No results for input(s): CHOL, HDL, LDLCALC, TRIG, CHOLHDL, LDLDIRECT in the  last 72 hours. Thyroid Function Tests: No results for input(s): TSH, T4TOTAL, FREET4, T3FREE, THYROIDAB in the last 72 hours. Anemia Panel: Recent Labs    02/20/22 0440  FERRITIN 310*  TIBC 118*  IRON 7*   Sepsis Labs: No results for input(s): PROCALCITON, LATICACIDVEN in the last 168 hours.  Recent Results (from the past 240 hour(s))  Surgical PCR screen     Status: Abnormal   Collection Time: 02/10/22  9:22 AM   Specimen: Nasal Mucosa; Nasal Swab  Result Value Ref Range Status   MRSA, PCR POSITIVE (A) NEGATIVE Final    Comment: RESULT CALLED TO, READ BACK BY AND VERIFIED WITH: OLVIA MARTIN @1503   DL    Staphylococcus aureus POSITIVE (A) NEGATIVE Final    Comment: (NOTE) The Xpert SA Assay (FDA approved for NASAL specimens in patients 32 years of age and older), is one component of a comprehensive surveillance program. It is not intended to diagnose infection nor to guide or monitor treatment. Performed at [facility], [address] [place], [place] [address]   Aerobic/Anaerobic Culture w Gram Stain (surgical/deep wound)     Status: None   Collection Time: 02/10/22 11:31 AM   Specimen: PATH Other; Tissue  Result Value Ref Range Status   Specimen Description WOUND  Final   Special Requests LEFT AC JOINT  Final   Gram Stain   Final    RARE WBC PRESENT,BOTH PMN AND MONONUCLEAR RARE GRAM POSITIVE COCCI IN CLUSTERS    Culture   Final    FEW METHICILLIN RESISTANT STAPHYLOCOCCUS AUREUS NO ANAEROBES ISOLATED Performed at [facility], [address] [place], [place] [address]    Report Status 02/15/2022 FINAL  Final   Organism ID, Bacteria METHICILLIN RESISTANT STAPHYLOCOCCUS AUREUS  Final      Susceptibility  Methicillin resistant staphylococcus aureus - MIC*    CIPROFLOXACIN >=8 RESISTANT Resistant     ERYTHROMYCIN >=8 RESISTANT Resistant     GENTAMICIN <=0.5 SENSITIVE Sensitive     OXACILLIN >=4 RESISTANT Resistant     TETRACYCLINE <=1 SENSITIVE  Sensitive     VANCOMYCIN <=0.5 SENSITIVE Sensitive     TRIMETH/SULFA >=320 RESISTANT Resistant     CLINDAMYCIN <=0.25 SENSITIVE Sensitive     RIFAMPIN <=0.5 SENSITIVE Sensitive     Inducible Clindamycin NEGATIVE Sensitive     * FEW METHICILLIN RESISTANT STAPHYLOCOCCUS AUREUS  Culture, blood (Routine X 2) w Reflex to ID Panel     Status: None   Collection Time: 02/13/22  1:19 AM   Specimen: BLOOD LEFT HAND  Result Value Ref Range Status   Specimen Description BLOOD LEFT HAND  Final   Special Requests AEROBIC BOTTLE ONLY Blood Culture adequate volume  Final   Culture   Final    NO GROWTH 5 DAYS Performed at [facility], [address], [place], [place] [address]    Report Status 02/18/2022 FINAL  Final  Culture, blood (Routine X 2) w Reflex to ID Panel     Status: None   Collection Time: 02/13/22  1:38 AM   Specimen: BLOOD RIGHT HAND  Result Value Ref Range Status   Specimen Description BLOOD RIGHT HAND  Final   Special Requests   Final    BOTTLES DRAWN AEROBIC AND ANAEROBIC Blood Culture adequate volume   Culture   Final    NO GROWTH 5 DAYS Performed at [facility], [address], [place], [place] [address]    Report Status 02/18/2022 FINAL  Final          Radiology Studies: No results found.      Scheduled Meds:  acetaminophen  1,000 mg Oral Q6H   bisacodyl  10 mg Oral Daily   Chlorhexidine Gluconate Cloth  6 each Topical Q0600   Chlorhexidine Gluconate Cloth  6 each Topical Q0600   clonazePAM  0.5 mg Oral BID   docusate sodium  100 mg Oral BID   [START ON 03/28/2022] linezolid  600 mg Oral Q12H   metoprolol tartrate  12.5 mg Oral BID   mupirocin ointment   Nasal BID   nicotine  14 mg Transdermal Daily   QUEtiapine  25 mg Oral QHS   sevelamer carbonate  1,600 mg Oral TID WC   sodium chloride flush  10-40 mL Intracatheter Q12H   sodium chloride flush  3 mL Intravenous Q12H   sodium polystyrene  30 g Rectal Once   Continuous Infusions:   ceFTAROline (TEFLARO) IV 200 mg (02/20/22 0529)   DAPTOmycin (CUBICIN)  IV Stopped (02/18/22 1929)   methocarbamol (ROBAXIN) IV      LOS: 14 days   Time spent:  Azucena Fallen, DO [facility]  If 7PM-7AM, please contact night-coverage [email or web address]  02/20/2022, 8:01 AM

## 2022-02-20 NOTE — Progress Notes (Signed)
Patient ID: Carol Rivera, female   DOB: 15-Dec-1990, 31 y.o.   MRN: [id number] [facility] Progress Note   Assessment/ Plan:   1.  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 Would not do a biopsy as this will not change her overall management which is to treat the underlying infection (immunosuppression not indicated in such settings). -started HD given worsening kidney function, borderline UOP, persistent hyperkalemia. S/p RIJ temp line 5/29 with IR. HD #1 on 5/30, #2 5/31, #3 6/1 (slow start protocol) -next HD tentatively planned for Sat -no evidence of renal recovery, will need to consider converting her temp line to a TDC (especially as her WBC improves). Now is not the time to do this -monitor strict I/O 2.  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 S/p I&D of left AC septic arthritis on 5/22. On daptomycin and ceftaroline. Repeat bcx on 5/25 neg 3.  Anemia: Likely secondary to acute/critical illness. Possible hemolytic anemia especially on Teflaro.Elevated LDH with negative direct Coombs test and pending haptoglobin. Transfuse prn, avoid IV iron, hgb stable today. Will hold off on iron given metastatic infection, Aranesp start 6/3 4.  Anasarca/hypoalbuminemia: with significant proteinruia (nephrotic range from staph assc'd GN). Push protein. Will UF as tolerated 5.  Hyperkalemia: Secondary to acute kidney injury, starting HD, K improving. Low K diet  Subjective:   Patient seen and examined on HD. Tolerating treatment. VSS. She does endorse ongoing abdominal pain and throat pain.   Objective:   BP 105/70   Pulse 85   Temp (!) 96 F (35.6 C)   Resp 20   Ht 5' (1.524 m)   Wt 73.2 kg   SpO2 96%   BMI 31.52 kg/m   Intake/Output Summary  (Last 24 hours) at 02/20/2022 1217 Last data filed at 02/19/2022 1800 Gross per 24 hour  Intake 340 ml  Output --  Net 340 ml   Weight change: 0.1 kg  Physical Exam: Gen: NAD, laying flat in bed CVS: Pulse regular rhythm, normal rate, 3/6 holosystolic murmur Resp:  normal WOB Abd: Soft, flat, nontender, bowel sounds normal Ext: Trace lower extremity/upper extremity edema Dialysis access: RIJ temp line  Imaging: No results found.  Labs: BMET Recent Labs  Lab 02/16/22 0509 02/16/22 1601 02/17/22 0055 02/17/22 0702 02/18/22 0144 02/19/22 0047 02/20/22 0440  NA 136 136 138 135 136 136 136  K 5.8* 6.2* 6.5* 5.9* 5.2* 5.1 5.3*  CL 109 107 110 107 106 106 104  CO2 19* 18* 17* 18* 19* 21* 21*  GLUCOSE 94 86 91 87 90 90 91  BUN 66* 71* 77* 80* 71* 67* 81*  CREATININE 3.92* 4.52* 4.49* 4.70* 4.54* 4.55* 5.33*  CALCIUM 7.2* 7.5* 7.6* 8.0* 7.1* 7.2* 7.3*  PHOS 9.0* 9.9* 10.2* 10.5* 9.7* 9.6* 10.1*   CBC Recent Labs  Lab 02/17/22 0055 02/17/22 0702 02/18/22 0144 02/19/22 0047  WBC 17.6* 17.6* 16.1* 16.7*  HGB 8.4* 8.6* 8.4* 8.1*  HCT 26.2* 25.8* 25.1* 25.2*  MCV 74.6* 72.5* 72.3* 73.7*  PLT 491* 574* 537* 518*    Medications:     acetaminophen  1,000 mg Oral Q6H   bisacodyl  10 mg Oral Daily   Chlorhexidine Gluconate Cloth  6 each Topical Q0600   Chlorhexidine Gluconate Cloth  6 each Topical Q0600   clonazePAM  0.5 mg  Oral BID   docusate sodium  100 mg Oral BID   [START ON 03/28/2022] linezolid  600 mg Oral Q12H   metoprolol tartrate  12.5 mg Oral BID   mupirocin ointment   Nasal BID   nicotine  14 mg Transdermal Daily   QUEtiapine  25 mg Oral QHS   sevelamer carbonate  1,600 mg Oral TID WC   sodium chloride flush  10-40 mL Intracatheter Q12H   sodium chloride flush  3 mL Intravenous Q12H   sodium polystyrene  30 g Rectal Once   Anthony Sar, MD [facility] 02/20/2022, 12:17 PM

## 2022-02-20 NOTE — TOC Benefit Eligibility Note (Addendum)
Patient Product/process development scientist completed.    The patient is currently admitted and upon discharge could be taking linezolid (Zyvox) 600 mg tablets.  The current 14 day co-pay is, $4.00.  [employer] will only pay for a 14 day supply)  The patient is insured through [employer]     Roland Earl, CPhT Pharmacy Patient Advocate Specialist [facility] Patient Advocate Team Direct Number: [phone]  Fax: [phone]

## 2022-02-20 NOTE — TOC Progression Note (Signed)
Transition of Care [facility]) - Progression Note    Patient Details  Name: Carol Rivera MRN: [id number] Date of Birth: 11-26-1990  Transition of Care [facility]) CM/SW Contact  Harriet Masson, RN Phone Number: 02/20/2022, 2:52 PM  Clinical Narrative:    Patient will need iv antibiotics until 03/27/22. Not a candidate for outpatient iv therapy.     Expected Discharge Plan: Home/Self Care Barriers to Discharge: No Barriers Identified  Expected Discharge Plan and Services Expected Discharge Plan: Home/Self Care   Discharge Planning Services: CM Consult   Living arrangements for the past 2 months: Single Family Home                                       Social Determinants of Health (SDOH) Interventions    Readmission Risk Interventions     View : No data to display.

## 2022-02-21 DIAGNOSIS — B9562 Methicillin resistant Staphylococcus aureus infection as the cause of diseases classified elsewhere: Secondary | ICD-10-CM | POA: Diagnosis not present

## 2022-02-21 DIAGNOSIS — R7881 Bacteremia: Secondary | ICD-10-CM | POA: Diagnosis not present

## 2022-02-21 LAB — RENAL FUNCTION PANEL
Albumin: 1.6 g/dL — ABNORMAL LOW (ref 3.5–5.0)
Anion gap: 11 (ref 5–15)
BUN: 50 mg/dL — ABNORMAL HIGH (ref 6–20)
CO2: 24 mmol/L (ref 22–32)
Calcium: 7.1 mg/dL — ABNORMAL LOW (ref 8.9–10.3)
Chloride: 101 mmol/L (ref 98–111)
Creatinine, Ser: 3.92 mg/dL — ABNORMAL HIGH (ref 0.44–1.00)
GFR, Estimated: 15 mL/min — ABNORMAL LOW (ref >60)
Glucose, Bld: 94 mg/dL (ref 70–99)
Phosphorus: 7.1 mg/dL — ABNORMAL HIGH (ref 2.5–4.6)
Potassium: 4.3 mmol/L (ref 3.5–5.1)
Sodium: 136 mmol/L (ref 135–145)

## 2022-02-21 LAB — GLUCOSE, CAPILLARY
Glucose-Capillary: 89 mg/dL (ref 70–99)
Glucose-Capillary: 101 mg/dL — ABNORMAL HIGH (ref 70–99)
Glucose-Capillary: 89 mg/dL (ref 70–99)
Glucose-Capillary: 144 mg/dL — ABNORMAL HIGH (ref 70–99)

## 2022-02-21 MED ORDER — OXYCODONE-ACETAMINOPHEN 5-325 MG PO TABS
1.0000 | ORAL_TABLET | ORAL | Status: DC | PRN
  Administered 2022-02-21 – 2022-02-23 (×4): 1 via ORAL
  Filled 2022-02-21 (×4): qty 1

## 2022-02-21 MED ORDER — FLUCONAZOLE IN SODIUM CHLORIDE 400-0.9 MG/200ML-% IV SOLN
400.0000 mg | Freq: Once | INTRAVENOUS | Status: AC
  Administered 2022-02-21: 400 mg via INTRAVENOUS
  Filled 2022-02-21: qty 200

## 2022-02-21 MED ORDER — ALUM & MAG HYDROXIDE-SIMETH 200-200-20 MG/5ML PO SUSP
30.0000 mL | ORAL | Status: DC | PRN
  Administered 2022-02-21: 30 mL via ORAL
  Filled 2022-02-21: qty 30

## 2022-02-21 MED ORDER — OXYCODONE HCL 5 MG PO TABS
5.0000 mg | ORAL_TABLET | ORAL | Status: DC | PRN
  Administered 2022-02-21 – 2022-02-22 (×4): 10 mg via ORAL
  Administered 2022-02-23: 5 mg via ORAL
  Administered 2022-02-23 – 2022-02-24 (×3): 10 mg via ORAL
  Filled 2022-02-21: qty 1
  Filled 2022-02-21 (×7): qty 2

## 2022-02-21 NOTE — Progress Notes (Signed)
Patient ID: Carol Rivera, female   DOB: [birth year], 31 y.o.   MRN: [id number] [facility] Progress Note   Assessment/ Plan:   1.  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 Would not do a biopsy as this will not change her overall management which is to treat the underlying infection (immunosuppression not indicated in such settings). -started HD given worsening kidney function, borderline UOP, persistent hyperkalemia. S/p RIJ temp line 5/29 with IR. HD #1 on 5/30, #2 5/31, #3 6/1 (slow start protocol) -next HD tentatively planned for Sat, will likely maintain on TTS schedule thereafter -no evidence of renal recovery, will need to consider converting her temp line to a TDC (especially as her WBC improves). Now is not the time to do this -monitor strict I/O 2.  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 S/p I&D of left AC septic arthritis on 5/22. On daptomycin and ceftaroline. Repeat bcx on 5/25 neg. Not a candidate for outpatient Abx 3.  Anemia: Likely secondary to acute/critical illness. Possible hemolytic anemia especially on Teflaro.Elevated LDH with negative direct Coombs test and pending haptoglobin. Transfuse prn, avoid IV iron, hgb stable today. Will hold off on iron given metastatic infection, Aranesp start 6/3 4.  Anasarca/hypoalbuminemia: with significant proteinruia (nephrotic range from staph assc'd GN). Push protein. Will UF as tolerated 5.  Hyperkalemia: Secondary to acute kidney injury, starting HD, K improving. Low K diet 6. CKD MBD -corrected Cal WNL. PTH ordered,on renvela  Subjective:   Patient seen and examined bedside. She reports ongoing issues with abd pain but otherwise reports that she tolerated HD yesterday. She does report  that she still feels 'puffy'.   Objective:   BP 103/65 (BP Location: Right Wrist)   Pulse 81   Temp 98 F (36.7 C) (Oral)   Resp 17   Ht 5' (1.524 m)   Wt 75.2 kg   SpO2 98%   BMI 32.38 kg/m   Intake/Output Summary (Last 24 hours) at 02/21/2022 1316 Last data filed at 02/21/2022 1200 Gross per 24 hour  Intake 400.49 ml  Output -238 ml  Net 638.49 ml   Weight change: 2 kg  Physical Exam: Gen: NAD, laying flat in bed CVS: Pulse regular rhythm, normal rate, 3/6 holosystolic murmur Resp:  normal WOB Abd: Soft, flat, nontender, bowel sounds normal Ext: Trace lower extremity/upper extremity edema Dialysis access: RIJ temp line  Imaging: No results found.  Labs: BMET Recent Labs  Lab 02/16/22 1601 02/17/22 0055 02/17/22 0702 02/18/22 0144 02/19/22 0047 02/20/22 0440 02/21/22 0410  NA 136 138 135 136 136 136 136  K 6.2* 6.5* 5.9* 5.2* 5.1 5.3* 4.3  CL 107 110 107 106 106 104 101  CO2 18* 17* 18* 19* 21* 21* 24  GLUCOSE 86 91 87 90 90 91 94  BUN 71* 77* 80* 71* 67* 81* 50*  CREATININE 4.52* 4.49* 4.70* 4.54* 4.55* 5.33* 3.92*  CALCIUM 7.5* 7.6* 8.0* 7.1* 7.2* 7.3* 7.1*  PHOS 9.9* 10.2* 10.5* 9.7* 9.6* 10.1* 7.1*   CBC Recent Labs  Lab 02/17/22 0055 02/17/22 0702 02/18/22 0144 02/19/22 0047  WBC 17.6* 17.6* 16.1* 16.7*  HGB 8.4* 8.6* 8.4* 8.1*  HCT 26.2* 25.8* 25.1* 25.2*  MCV 74.6* 72.5* 72.3* 73.7*  PLT 491* 574* 537* 518*    Medications:     acetaminophen  1,000  mg Oral Q6H   bisacodyl  10 mg Oral Daily   Chlorhexidine Gluconate Cloth  6 each Topical Q0600   Chlorhexidine Gluconate Cloth  6 each Topical Q0600   clonazePAM  0.5 mg Oral BID   [START ON 02/22/2022] darbepoetin (ARANESP) injection - DIALYSIS  100 mcg Intravenous Q Sat-HD   docusate sodium  100 mg Oral BID   [START ON 03/28/2022] linezolid  600 mg Oral Q12H   metoprolol tartrate  12.5 mg Oral BID   mupirocin ointment   Nasal BID   nicotine  14 mg Transdermal Daily   QUEtiapine  25 mg Oral  QHS   sevelamer carbonate  1,600 mg Oral TID WC   sodium chloride flush  10-40 mL Intracatheter Q12H   sodium chloride flush  3 mL Intravenous Q12H   sodium polystyrene  30 g Rectal Once   Anthony Sar, MD [facility] 02/21/2022, 1:16 PM

## 2022-02-21 NOTE — Progress Notes (Signed)
PROGRESS NOTE    Carol BoozeJennifer L Rivera  WUJ:[id number] DOB: 05/28/1991 DOA: 02/06/2022 PCP: Oneita HurtPcp, No   Brief Narrative:  Carol BoozeJennifer L Rivera is a 31 y.o. female with a history of IVDU who presented 5/18 with fever, cough, chest pain and left shoulder pain. She was found to have septic shock due to MRSA bacteremia as well as large destructive TV endocarditis with TR. IV antibiotics were continued and tailored to cultures which were persistently positive until 5/25 which have remain NGTD. CT surgery stated the location (ventricular side) of the vegetations is not accessible by angiovac and she is not a candidate for valve replacement. IV daptomycin and ceftaroline have been continued per ID recommendations. Course complicated by left AC septic joint s/p I&D 5/22, anemia of acute illness s/p 1u PRBCs 5/27, and renal failure with anasarca for which dialysis will be initiated through right IJ temporary dialysis catheter placed 02/17/2022.   Assessment & Plan:   Principal Problem:   MRSA bacteremia Active Problems:   Sepsis (HCC)   Hyponatremia   Elevated LFTs   IVDU (intravenous drug user)   Left shoulder pain   Chest pain, pleuritic   Septic pulmonary embolism without acute cor pulmonale (HCC)   Endocarditis of tricuspid valve   Staphylococcal arthritis of left shoulder (HCC)   Osteomyelitis (HCC)   Normocytic anemia   Microcytic anemia   Hypoalbuminemia   AKI (acute kidney injury) (HCC)   Septic arthritis of AC joint (HCC)  MRSA bacteremia with multifocal infections(multiple areas osteomyelitis/septic arthritis), septic pulmonary emboli, tricuspid valve endocarditis with TR: CT surgery eval 5/22 not accessible via angiovac, and not a candidate for valve replacement given recent IV drug abuse and valve destruction.  - TEE 5/23: large mobile TV posterior leaflet vegetation with destruction of the leaflet and "torrential tricuspid insufficiency." -No current indication for intervention per  discussion with cardiology - Blood cultures were persistently positive, though 5/25 set is NGTD.  - Plan to continue daptomycin x 6 weeks from negative cultures - through 03/27/22 - thereafter transition to linezolid on discharge to complete 8 weeks of treatment through 04/10/22   Intractable pain secondary to recent surgery, bacteremia, multifocal infections and septic arthritis/osteomyelitis  -Added Percocet 01/23/2024 for moderate pain, oxycodone IR for breakthrough pain ongoing -Discontinue IV narcotics; continue to hold given history and need for discharge planning.  Anemia of acute illness:  - H/H stable s/p 1u PRBCs 5/27. No active bleeding. Coombs negative.   Acute renal failure: With hyperkalemia, metabolic acidosis, declining urine output, suspect she may require HD for which nephrology has consulted IR for access. Renal U/S with increased echogenicity, no hydro. - [facility] same [facility], 2nd 5/30 scheduled. - Nephrology following - continue HD per their expertise - Monitor UOP.   Hyperkalemia, resolved:  -Managed with dialysis.   Wheezing: Resolved. - Continue albuterol prn -Likely secondary to volume overload given above   T11 osteomyelitis: demonstrating T2-hyperintensity, suspect new osteomyelitis. Dr. Maurice Smallstergard, neurosurgery evaluated the patient on 5/26, recommending continuation of antibiotics without acute neurosurgical intervention. +clonus on L > R so could consider repeat MR brain given her risk of septic cerebral emboli.  -IV narcotics discontinued, continue to titrate p.o. medications as appropriate   Left AC septic arthritis: s/p I&D 5/22.  - Wound care per orthopedics. Will need suture removal ~10 day postoperatively per orthopedics.   IVDU:  - Cessation counseling provided.    Acute metabolic encephalopathy: Improved from admission.  - Continue  supportive seroquel and clonazepam.    Obesity: Estimated body mass index is 32.29 kg/m as  calculated from the following:   Height as of this encounter: 5' (1.524 m).   Weight as of this encounter: 75 kg.  DVT prophylaxis: Early ambulation Code Status: Full Family Communication: None present  Status is: Inpatient  Dispo: The patient is from: Home              Anticipated d/c is to: Home              Anticipated d/c date is: Pending completion of antibiotics as above -likely require 6 weeks of IV antibiotics in total              Patient currently not medically stable for discharge  Consultants:  ID, orthopedic surgery, nephrology  Antimicrobials:  Daptomycin/ceftaroline per ID  Subjective: No acute issues or events overnight denies nausea vomiting diarrhea constipation headache fevers chills or chest pain  Objective: Vitals:   02/20/22 1633 02/20/22 1924 02/20/22 2309 02/21/22 0312  BP: 95/76 109/81 103/62 102/74  Pulse: 86 89 82 88  Resp: 20 20 20 20   Temp: 98.8 F (37.1 C) 97.8 F (36.6 C) 97.8 F (36.6 C) 97.8 F (36.6 C)  TempSrc: Oral Oral Oral Oral  SpO2: 95% 95%    Weight:    75.2 kg  Height:        Intake/Output Summary (Last 24 hours) at 02/21/2022 0735 Last data filed at 02/20/2022 1800 Gross per 24 hour  Intake 100 ml  Output -238 ml  Net 338 ml    Filed Weights   02/19/22 0117 02/20/22 0400 02/21/22 0312  Weight: 72.7 kg 73.2 kg 75.2 kg    Examination:  General:  Pleasantly resting in bed, No acute distress. HEENT:  Normocephalic atraumatic.  Sclerae nonicteric, noninjected.  Extraocular movements intact bilaterally. Neck: Right IJ hemodialysis catheter. Lungs:  Clear to auscultate bilaterally without rhonchi, wheeze, or rales. Heart:  Regular rate and rhythm.  Without murmurs, rubs, or gallops. Abdomen:  Soft, nontender, nondistended.  Without guarding or rebound. Extremities: Limited range of motion left upper extremity  Vascular:  Dorsalis pedis and posterior tibial pulses palpable bilaterally.  Data Reviewed: I have personally  reviewed following labs and imaging studies  CBC: Recent Labs  Lab 02/16/22 0509 02/17/22 0055 02/17/22 0702 02/18/22 0144 02/19/22 0047  WBC 17.6* 17.6* 17.6* 16.1* 16.7*  HGB 7.7* 8.4* 8.6* 8.4* 8.1*  HCT 23.5* 26.2* 25.8* 25.1* 25.2*  MCV 72.8* 74.6* 72.5* 72.3* 73.7*  PLT 410* 491* 574* 537* 518*    Basic Metabolic Panel: Recent Labs  Lab 02/15/22 0446 02/16/22 0509 02/17/22 0702 02/18/22 0144 02/19/22 0047 02/20/22 0440 02/21/22 0410  NA 137   < > 135 136 136 136 136  K 5.1   < > 5.9* 5.2* 5.1 5.3* 4.3  CL 111   < > 107 106 106 104 101  CO2 19*   < > 18* 19* 21* 21* 24  GLUCOSE 102*   < > 87 90 90 91 94  BUN 53*   < > 80* 71* 67* 81* 50*  CREATININE 3.05*   < > 4.70* 4.54* 4.55* 5.33* 3.92*  CALCIUM 7.2*   < > 8.0* 7.1* 7.2* 7.3* 7.1*  MG 2.1  --   --   --   --   --   --   PHOS 7.1*   < > 10.5* 9.7* 9.6* 10.1* 7.1*   < > = values  in this interval not displayed.    GFR: Estimated Creatinine Clearance: 18.8 mL/min (A) (by C-G formula based on SCr of 3.92 mg/dL (H)). Liver Function Tests: Recent Labs  Lab 02/16/22 0509 02/16/22 1601 02/17/22 0702 02/18/22 0144 02/19/22 0047 02/20/22 0440 02/21/22 0410  AST 18  --   --   --   --   --   --   ALT 16  --   --   --   --   --   --   ALKPHOS 78  --   --   --   --   --   --   BILITOT 1.5*  --   --   --   --   --   --   PROT 5.7*  --   --   --   --   --   --   ALBUMIN 1.9*  1.9*   < > 1.9* 1.6* 1.6* 1.5* 1.6*   < > = values in this interval not displayed.    No results for input(s): LIPASE, AMYLASE in the last 168 hours. No results for input(s): AMMONIA in the last 168 hours. Coagulation Profile: No results for input(s): INR, PROTIME in the last 168 hours. Cardiac Enzymes: Recent Labs  Lab 02/16/22 0509  CKTOTAL 23*    BNP (last 3 results) No results for input(s): PROBNP in the last 8760 hours. HbA1C: No results for input(s): HGBA1C in the last 72 hours. CBG: Recent Labs  Lab 02/19/22 1538  02/19/22 2205 02/20/22 0815 02/20/22 1635 02/20/22 2121  GLUCAP 102* 92 89 96 91    Lipid Profile: No results for input(s): CHOL, HDL, LDLCALC, TRIG, CHOLHDL, LDLDIRECT in the last 72 hours. Thyroid Function Tests: No results for input(s): TSH, T4TOTAL, FREET4, T3FREE, THYROIDAB in the last 72 hours. Anemia Panel: Recent Labs    02/20/22 0440  FERRITIN 310*  TIBC 118*  IRON 7*    Sepsis Labs: No results for input(s): PROCALCITON, LATICACIDVEN in the last 168 hours.  Recent Results (from the past 240 hour(s))  Culture, blood (Routine X 2) w Reflex to ID Panel     Status: None   Collection Time: 02/13/22  1:19 AM   Specimen: BLOOD LEFT HAND  Result Value Ref Range Status   Specimen Description BLOOD LEFT HAND  Final   Special Requests AEROBIC BOTTLE ONLY Blood Culture adequate volume  Final   Culture   Final    NO GROWTH 5 DAYS Performed at [facility], [address], [place], [place] [address]    Report Status 02/18/2022 FINAL  Final  Culture, blood (Routine X 2) w Reflex to ID Panel     Status: None   Collection Time: 02/13/22  1:38 AM   Specimen: BLOOD RIGHT HAND  Result Value Ref Range Status   Specimen Description BLOOD RIGHT HAND  Final   Special Requests   Final    BOTTLES DRAWN AEROBIC AND ANAEROBIC Blood Culture adequate volume   Culture   Final    NO GROWTH 5 DAYS Performed at [facility], [address] [place], [place] [address]    Report Status 02/18/2022 FINAL  Final          Radiology Studies: No results found.      Scheduled Meds:  acetaminophen  1,000 mg Oral Q6H   bisacodyl  10 mg Oral Daily   Chlorhexidine Gluconate Cloth  6 each Topical Q0600   Chlorhexidine Gluconate Cloth  6 each Topical Q0600   clonazePAM  0.5 mg Oral BID   [START ON 02/22/2022] darbepoetin (ARANESP) injection - DIALYSIS  100 mcg Intravenous Q Sat-HD   docusate sodium  100 mg Oral BID   [START ON 03/28/2022] linezolid  600 mg Oral Q12H    metoprolol tartrate  12.5 mg Oral BID   mupirocin ointment   Nasal BID   nicotine  14 mg Transdermal Daily   QUEtiapine  25 mg Oral QHS   sevelamer carbonate  1,600 mg Oral TID WC   sodium chloride flush  10-40 mL Intracatheter Q12H   sodium chloride flush  3 mL Intravenous Q12H   sodium polystyrene  30 g Rectal Once   Continuous Infusions:  ceFTAROline (TEFLARO) IV 200 mg (02/21/22 0549)   DAPTOmycin (CUBICIN)  IV 500 mg (02/20/22 1947)   methocarbamol (ROBAXIN) IV      LOS: 15 days   Time spent:  Azucena Fallen, DO [facility]  If 7PM-7AM, please contact night-coverage [email or web address]  02/21/2022, 7:35 AM

## 2022-02-22 DIAGNOSIS — B9562 Methicillin resistant Staphylococcus aureus infection as the cause of diseases classified elsewhere: Secondary | ICD-10-CM | POA: Diagnosis not present

## 2022-02-22 DIAGNOSIS — R7881 Bacteremia: Secondary | ICD-10-CM | POA: Diagnosis not present

## 2022-02-22 LAB — CBC
HCT: 18.8 % — ABNORMAL LOW (ref 36.0–46.0)
Hemoglobin: 6.1 g/dL — CL (ref 12.0–15.0)
MCH: 24.4 pg — ABNORMAL LOW (ref 26.0–34.0)
MCHC: 32.4 g/dL (ref 30.0–36.0)
MCV: 75.2 fL — ABNORMAL LOW (ref 80.0–100.0)
Platelets: 364 10*3/uL (ref 150–400)
RBC: 2.5 MIL/uL — ABNORMAL LOW (ref 3.87–5.11)
RDW: 22.9 % — ABNORMAL HIGH (ref 11.5–15.5)
WBC: 13.9 10*3/uL — ABNORMAL HIGH (ref 4.0–10.5)
nRBC: 0 % (ref 0.0–0.2)

## 2022-02-22 LAB — HEMOGLOBIN AND HEMATOCRIT, BLOOD
HCT: 18.2 % — ABNORMAL LOW (ref 36.0–46.0)
Hemoglobin: 5.8 g/dL — CL (ref 12.0–15.0)

## 2022-02-22 LAB — GLUCOSE, CAPILLARY
Glucose-Capillary: 95 mg/dL (ref 70–99)
Glucose-Capillary: 96 mg/dL (ref 70–99)
Glucose-Capillary: 115 mg/dL — ABNORMAL HIGH (ref 70–99)

## 2022-02-22 LAB — RENAL FUNCTION PANEL
Albumin: 1.5 g/dL — ABNORMAL LOW (ref 3.5–5.0)
Anion gap: 9 (ref 5–15)
BUN: 61 mg/dL — ABNORMAL HIGH (ref 6–20)
CO2: 24 mmol/L (ref 22–32)
Calcium: 7.3 mg/dL — ABNORMAL LOW (ref 8.9–10.3)
Chloride: 103 mmol/L (ref 98–111)
Creatinine, Ser: 4.5 mg/dL — ABNORMAL HIGH (ref 0.44–1.00)
GFR, Estimated: 13 mL/min — ABNORMAL LOW (ref >60)
Glucose, Bld: 96 mg/dL (ref 70–99)
Phosphorus: 8.3 mg/dL — ABNORMAL HIGH (ref 2.5–4.6)
Potassium: 4.7 mmol/L (ref 3.5–5.1)
Sodium: 136 mmol/L (ref 135–145)

## 2022-02-22 LAB — PREPARE RBC (CROSSMATCH)

## 2022-02-22 MED ORDER — SODIUM CHLORIDE 0.9% IV SOLUTION: Freq: Once | INTRAVENOUS | Status: AC

## 2022-02-22 NOTE — Progress Notes (Signed)
Patient's dialysis system clotted.  Arterial line pulling air.  Dr Thedore Mins notified

## 2022-02-22 NOTE — Progress Notes (Signed)
Patient ID: Carol Rivera, female   DOB: 04/10/1991, 31 y.o.   MRN: [id number] [facility] Progress Note   Assessment/ Plan:   1.  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 Would not do a biopsy as this will not change her overall management which is to treat the underlying infection (immunosuppression not indicated in such settings). -started HD given worsening kidney function, borderline UOP, persistent hyperkalemia. S/p RIJ temp line 5/29 with IR. HD #1 on 5/30, #2 5/31, #3 6/1 (slow start protocol) -With catheter dysfunction, pulling air from arterial line.  Consulted IR for temp line replacement.  Next HD tentatively planned for Monday after her new catheter is placed. -no evidence of renal recovery, will need to consider converting her temp line to a TDC (especially as her WBC improves--CBC ordered). Now is not the time to do this -monitor strict I/O 2.  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 S/p I&D of left AC septic arthritis on 5/22. On daptomycin and ceftaroline. Repeat bcx on 5/25 neg. Not a candidate for outpatient Abx 3.  Anemia: Likely secondary to acute/critical illness. Possible hemolytic anemia especially on Teflaro.Elevated LDH with negative direct Coombs test and pending haptoglobin. Transfuse prn, avoid IV iron, hgb stable today. Will hold off on iron given metastatic infection, Aranesp start 6/3.  CBC ordered for today 4.  Anasarca/hypoalbuminemia: with significant proteinruia (nephrotic range from staph assc'd GN). Push protein. Will UF as tolerated 5.  Hyperkalemia: Secondary to acute kidney injury, starting HD, K improving. Low K diet 6. CKD MBD -corrected Cal WNL. PTH ordered,on renvela  Subjective:   Patient seen and  examined bedside.  Was called earlier today for catheter dysfunction, per staff arterial line was pulling air.  Therefore, treatment terminated red for about an hour.  Patient does report ongoing issues with pain but otherwise denies any chest pain, shortness of breath.   Objective:   BP 110/74   Pulse 96   Temp 97.9 F (36.6 C)   Resp 18   Ht 5' (1.524 m)   Wt 72.9 kg   SpO2 96%   BMI 31.39 kg/m   Intake/Output Summary (Last 24 hours) at 02/22/2022 1241 Last data filed at 02/22/2022 0538 Gross per 24 hour  Intake 684 ml  Output --  Net 684 ml   Weight change: 0.7 kg  Physical Exam: Gen: Uncomfortable appearing, laying flat in bed CVS: Pulse regular rhythm, normal rate, 3/6 holosystolic murmur Resp:  normal WOB Abd: Soft, flat, nontender, bowel sounds normal Ext: Trace lower extremity/upper extremity edema Dialysis access: RIJ temp line  Imaging: No results found.  Labs: BMET Recent Labs  Lab 02/17/22 0055 02/17/22 3662 02/18/22 0144 02/19/22 0047 02/20/22 0440 02/21/22 0410 02/22/22 0307  NA 138 135 136 136 136 136 136  K 6.5* 5.9* 5.2* 5.1 5.3* 4.3 4.7  CL 110 107 106 106 104 101 103  CO2 17* 18* 19* 21* 21* 24 24  GLUCOSE 91 87 90 90 91 94 96  BUN 77* 80* 71* 67* 81* 50* 61*  CREATININE 4.49* 4.70* 4.54* 4.55* 5.33* 3.92* 4.50*  CALCIUM 7.6* 8.0* 7.1* 7.2* 7.3* 7.1* 7.3*  PHOS 10.2* 10.5* 9.7* 9.6* 10.1* 7.1* 8.3*   CBC Recent Labs  Lab 02/17/22 0055 02/17/22 0702 02/18/22 0144 02/19/22 0047  WBC 17.6* 17.6* 16.1* 16.7*  HGB 8.4* 8.6* 8.4* 8.1*  HCT 26.2* 25.8* 25.1* 25.2*  MCV 74.6* 72.5* 72.3* 73.7*  PLT 491* 574* 537* 518*    Medications:     acetaminophen  1,000 mg Oral Q6H   bisacodyl  10 mg Oral Daily   Chlorhexidine Gluconate Cloth  6 each Topical Q0600   Chlorhexidine Gluconate Cloth  6 each Topical Q0600   clonazePAM  0.5 mg Oral BID   darbepoetin (ARANESP) injection - DIALYSIS  100 mcg Intravenous Q Sat-HD   docusate sodium  100 mg  Oral BID   [START ON 03/28/2022] linezolid  600 mg Oral Q12H   metoprolol tartrate  12.5 mg Oral BID   mupirocin ointment   Nasal BID   nicotine  14 mg Transdermal Daily   QUEtiapine  25 mg Oral QHS   sevelamer carbonate  1,600 mg Oral TID WC   sodium chloride flush  10-40 mL Intracatheter Q12H   sodium chloride flush  3 mL Intravenous Q12H   sodium polystyrene  30 g Rectal Once   Anthony Sar, MD [facility] 02/22/2022, 12:41 PM

## 2022-02-22 NOTE — Progress Notes (Addendum)
PROGRESS NOTE    Carol Rivera  Carol Rivera:[id number] DOB: 1990/10/30 DOA: 02/06/2022 PCP: Oneita Hurt, No   Brief Narrative:  Carol Rivera is a 31 y.o. female with a history of IVDU who presented 5/18 with fever, cough, chest pain and left shoulder pain. She was found to have septic shock due to MRSA bacteremia as well as large destructive TV endocarditis with TR. IV antibiotics were continued and tailored to cultures which were persistently positive until 5/25 which have remain NGTD. CT surgery stated the location (ventricular side) of the vegetations is not accessible by angiovac and she is not a candidate for valve replacement. IV daptomycin and ceftaroline have been continued per ID recommendations. Course complicated by left AC septic joint s/p I&D 5/22, anemia of acute illness s/p 1u PRBCs 5/27, and renal failure with anasarca for which dialysis will be initiated through right IJ temporary dialysis catheter placed 02/17/2022.   Assessment & Plan:   Principal Problem:   MRSA bacteremia Active Problems:   Sepsis (HCC)   Hyponatremia   Elevated LFTs   IVDU (intravenous drug user)   Left shoulder pain   Chest pain, pleuritic   Septic pulmonary embolism without acute cor pulmonale (HCC)   Endocarditis of tricuspid valve   Staphylococcal arthritis of left shoulder (HCC)   Osteomyelitis (HCC)   Normocytic anemia   Microcytic anemia   Hypoalbuminemia   AKI (acute kidney injury) (HCC)   Septic arthritis of AC joint (HCC)  MRSA bacteremia with multifocal infections(multiple areas osteomyelitis/septic arthritis), septic pulmonary emboli, tricuspid valve endocarditis with TR: CT surgery eval 5/22 not accessible via angiovac, and not a candidate for valve replacement given recent IV drug abuse and valve destruction.  - TEE 5/23: large mobile TV posterior leaflet vegetation with destruction of the leaflet and "torrential tricuspid insufficiency." -No current indication for intervention per  discussion with cardiology - Blood cultures were persistently positive, though 5/25 set is NGTD.  - Plan to continue daptomycin x 6 weeks from negative cultures - through 03/27/22 - thereafter transition to linezolid on discharge to complete 8 weeks of treatment through 04/10/22   Intractable pain secondary to recent surgery, bacteremia, multifocal infections and septic arthritis/osteomyelitis  -Continue Percocet 01/23/2024 for moderate pain, oxycodone IR for breakthrough pain ongoing -alternating doses as required -If pain continues to be poorly controlled over the next 24 hours we will consider scheduled OxyContin twice daily with oxycodone IR for breakthrough and discontinue Percocet -Discontinue IV narcotics; continue to hold given history and need for discharge planning. -*High risk for IV drug abuse, unfortunately patient states her pain is still poorly controlled despite being quite somnolent with borderline hypotension.  We discussed that she will not be pain-free during his hospitalization and that our goal is to maintain pain control postoperatively and as infection resolves.  If her blood pressure continues to be low we will need to decrease her narcotic burden yet again.  Anemia of acute illness vs ESRD Rule out acute blood loss  - Acutely worsening 02/22/22 - 6.1/5.8 on recheck - 2u PRBC planned -follow clinically - H/H stable s/p 1u PRBCs 5/27. No active bleeding. Coombs negative.   Acute renal failure: With hyperkalemia, metabolic acidosis, declining urine output, suspect she may require HD for which nephrology has consulted IR for access. Renal U/S with increased echogenicity, no hydro. - IR placed right IJ [facility] 5/29, had 1st HD same day, 2nd 5/30 scheduled. - Nephrology following - continue HD per their expertise - Monitor UOP.  Hyperkalemia, resolved:  -Managed with dialysis.   Wheezing: Resolved. - Continue albuterol prn -Likely secondary to volume overload given above   T11  osteomyelitis:  -T2-hyperintensity on imaging, suspect new osteomyelitis.  -Dr. Maurice Small, neurosurgery evaluated the patient on 5/26, recommending continuation of antibiotics without acute neurosurgical intervention. -IV antibiotics ongoing as above   Left AC septic arthritis: s/p I&D 5/22.  - Wound care per orthopedics. Will need suture removal ~10 day postoperatively per orthopedics.   IVDU:  - Cessation counseling provided.    Acute metabolic encephalopathy:  -Resolved -Continue supportive seroquel and clonazepam.    Obesity: Estimated body mass index is 32.29 kg/m as calculated from the following:   Height as of this encounter: 5' (1.524 m).   Weight as of this encounter: 75 kg.  DVT prophylaxis: Early ambulation Code Status: Full Family Communication: None present  Status is: Inpatient  Dispo: The patient is from: Home              Anticipated d/c is to: Home              Anticipated d/c date is: Pending completion of antibiotics as above -likely require 6 weeks of IV antibiotics in total              Patient currently not medically stable for discharge  Consultants:  ID, orthopedic surgery, nephrology  Antimicrobials:  Daptomycin/ceftaroline per ID  Subjective: No acute issues or events overnight denies nausea vomiting diarrhea constipation headache fevers chills or chest pain -still complaining of diffuse shoulder back neck pain despite increased narcotics.  Objective: Vitals:   02/21/22 2129 02/21/22 2343 02/22/22 0311 02/22/22 0500  BP: 107/78 (!) 87/59 (!) 93/59   Pulse: 84 77 80   Resp: 20 19 20    Temp:  97.7 F (36.5 C) 97.6 F (36.4 C)   TempSrc:  Oral Oral   SpO2:  98% 98%   Weight:    75.9 kg  Height:        Intake/Output Summary (Last 24 hours) at 02/22/2022 0735 Last data filed at 02/22/2022 0538 Gross per 24 hour  Intake 984.49 ml  Output --  Net 984.49 ml    Filed Weights   02/20/22 0400 02/21/22 0312 02/22/22 0500  Weight: 73.2 kg 75.2  kg 75.9 kg    Examination:  General:  Pleasantly resting in bed, No acute distress. HEENT:  Normocephalic atraumatic.  Sclerae nonicteric, noninjected.  Extraocular movements intact bilaterally. Neck: Right IJ hemodialysis catheter. Lungs:  Clear to auscultate bilaterally without rhonchi, wheeze, or rales. Heart:  Regular rate and rhythm.  Without murmurs, rubs, or gallops. Abdomen:  Soft, nontender, nondistended.  Without guarding or rebound. Extremities: Limited range of motion left upper extremity  Vascular:  Dorsalis pedis and posterior tibial pulses palpable bilaterally.  Data Reviewed: I have personally reviewed following labs and imaging studies  CBC: Recent Labs  Lab 02/16/22 0509 02/17/22 0055 02/17/22 0702 02/18/22 0144 02/19/22 0047  WBC 17.6* 17.6* 17.6* 16.1* 16.7*  HGB 7.7* 8.4* 8.6* 8.4* 8.1*  HCT 23.5* 26.2* 25.8* 25.1* 25.2*  MCV 72.8* 74.6* 72.5* 72.3* 73.7*  PLT 410* 491* 574* 537* 518*    Basic Metabolic Panel: Recent Labs  Lab 02/18/22 0144 02/19/22 0047 02/20/22 0440 02/21/22 0410 02/22/22 0307  NA 136 136 136 136 136  K 5.2* 5.1 5.3* 4.3 4.7  CL 106 106 104 101 103  CO2 19* 21* 21* 24 24  GLUCOSE 90 90 91 94 96  BUN 71*  67* 81* 50* 61*  CREATININE 4.54* 4.55* 5.33* 3.92* 4.50*  CALCIUM 7.1* 7.2* 7.3* 7.1* 7.3*  PHOS 9.7* 9.6* 10.1* 7.1* 8.3*    GFR: Estimated Creatinine Clearance: 16.5 mL/min (A) (by C-G formula based on SCr of 4.5 mg/dL (H)). Liver Function Tests: Recent Labs  Lab 02/16/22 0509 02/16/22 1601 02/18/22 0144 02/19/22 0047 02/20/22 0440 02/21/22 0410 02/22/22 0307  AST 18  --   --   --   --   --   --   ALT 16  --   --   --   --   --   --   ALKPHOS 78  --   --   --   --   --   --   BILITOT 1.5*  --   --   --   --   --   --   PROT 5.7*  --   --   --   --   --   --   ALBUMIN 1.9*  1.9*   < > 1.6* 1.6* 1.5* 1.6* 1.5*   < > = values in this interval not displayed.    No results for input(s): LIPASE, AMYLASE in the  last 168 hours. No results for input(s): AMMONIA in the last 168 hours. Coagulation Profile: No results for input(s): INR, PROTIME in the last 168 hours. Cardiac Enzymes: Recent Labs  Lab 02/16/22 0509  CKTOTAL 23*    BNP (last 3 results) No results for input(s): PROBNP in the last 8760 hours. HbA1C: No results for input(s): HGBA1C in the last 72 hours. CBG: Recent Labs  Lab 02/20/22 2121 02/21/22 0754 02/21/22 1145 02/21/22 1553 02/21/22 2054  GLUCAP 91 89 101* 89 144*    Lipid Profile: No results for input(s): CHOL, HDL, LDLCALC, TRIG, CHOLHDL, LDLDIRECT in the last 72 hours. Thyroid Function Tests: No results for input(s): TSH, T4TOTAL, FREET4, T3FREE, THYROIDAB in the last 72 hours. Anemia Panel: Recent Labs    02/20/22 0440  FERRITIN 310*  TIBC 118*  IRON 7*    Sepsis Labs: No results for input(s): PROCALCITON, LATICACIDVEN in the last 168 hours.  Recent Results (from the past 240 hour(s))  Culture, blood (Routine X 2) w Reflex to ID Panel     Status: None   Collection Time: 02/13/22  1:19 AM   Specimen: BLOOD LEFT HAND  Result Value Ref Range Status   Specimen Description BLOOD LEFT HAND  Final   Special Requests AEROBIC BOTTLE ONLY Blood Culture adequate volume  Final   Culture   Final    NO GROWTH 5 DAYS Performed at [facility], [address] [place], [place] [address]    Report Status 02/18/2022 FINAL  Final  Culture, blood (Routine X 2) w Reflex to ID Panel     Status: None   Collection Time: 02/13/22  1:38 AM   Specimen: BLOOD RIGHT HAND  Result Value Ref Range Status   Specimen Description BLOOD RIGHT HAND  Final   Special Requests   Final    BOTTLES DRAWN AEROBIC AND ANAEROBIC Blood Culture adequate volume   Culture   Final    NO GROWTH 5 DAYS Performed at [facility], [address] [place], [place] [address]    Report Status 02/18/2022 FINAL  Final          Radiology Studies: No results  found.      Scheduled Meds:  acetaminophen  1,000 mg Oral Q6H   bisacodyl  10 mg Oral Daily   Chlorhexidine Gluconate Cloth  6 each Topical Q0600   Chlorhexidine Gluconate Cloth  6 each Topical Q0600   clonazePAM  0.5 mg Oral BID   darbepoetin (ARANESP) injection - DIALYSIS  100 mcg Intravenous Q Sat-HD   docusate sodium  100 mg Oral BID   [START ON 03/28/2022] linezolid  600 mg Oral Q12H   metoprolol tartrate  12.5 mg Oral BID   mupirocin ointment   Nasal BID   nicotine  14 mg Transdermal Daily   QUEtiapine  25 mg Oral QHS   sevelamer carbonate  1,600 mg Oral TID WC   sodium chloride flush  10-40 mL Intracatheter Q12H   sodium chloride flush  3 mL Intravenous Q12H   sodium polystyrene  30 g Rectal Once   Continuous Infusions:  ceFTAROline (TEFLARO) IV 200 mg (02/22/22 0641)   DAPTOmycin (CUBICIN)  IV Stopped (02/20/22 2017)   methocarbamol (ROBAXIN) IV      LOS: 16 days   Time spent:  Azucena Fallen, DO [facility]  If 7PM-7AM, please contact night-coverage [email or web address]  02/22/2022, 7:35 AM

## 2022-02-22 NOTE — Plan of Care (Signed)

## 2022-02-23 DIAGNOSIS — B9562 Methicillin resistant Staphylococcus aureus infection as the cause of diseases classified elsewhere: Secondary | ICD-10-CM | POA: Diagnosis not present

## 2022-02-23 DIAGNOSIS — R7881 Bacteremia: Secondary | ICD-10-CM | POA: Diagnosis not present

## 2022-02-23 LAB — CBC
HCT: 25.5 % — ABNORMAL LOW (ref 36.0–46.0)
Hemoglobin: 8.7 g/dL — ABNORMAL LOW (ref 12.0–15.0)
MCH: 26.8 pg (ref 26.0–34.0)
MCHC: 34.1 g/dL (ref 30.0–36.0)
MCV: 78.5 fL — ABNORMAL LOW (ref 80.0–100.0)
Platelets: 363 10*3/uL (ref 150–400)
RBC: 3.25 MIL/uL — ABNORMAL LOW (ref 3.87–5.11)
RDW: 21.6 % — ABNORMAL HIGH (ref 11.5–15.5)
WBC: 14.8 10*3/uL — ABNORMAL HIGH (ref 4.0–10.5)
nRBC: 0 % (ref 0.0–0.2)

## 2022-02-23 LAB — BPAM RBC
Blood Product Expiration Date: 202306222359
Blood Product Expiration Date: 202306262359
ISSUE DATE / TIME: 202306031955
ISSUE DATE / TIME: 202306032303
Unit Type and Rh: 6200
Unit Type and Rh: 6200

## 2022-02-23 LAB — TYPE AND SCREEN
ABO/RH(D): A POS
Antibody Screen: NEGATIVE
Unit division: 0
Unit division: 0

## 2022-02-23 LAB — RENAL FUNCTION PANEL
Albumin: <1.5 g/dL — ABNORMAL LOW (ref 3.5–5.0)
Anion gap: 11 (ref 5–15)
BUN: 60 mg/dL — ABNORMAL HIGH (ref 6–20)
CO2: 23 mmol/L (ref 22–32)
Calcium: 7.3 mg/dL — ABNORMAL LOW (ref 8.9–10.3)
Chloride: 102 mmol/L (ref 98–111)
Creatinine, Ser: 4.41 mg/dL — ABNORMAL HIGH (ref 0.44–1.00)
GFR, Estimated: 13 mL/min — ABNORMAL LOW (ref >60)
Glucose, Bld: 103 mg/dL — ABNORMAL HIGH (ref 70–99)
Phosphorus: 8.1 mg/dL — ABNORMAL HIGH (ref 2.5–4.6)
Potassium: 4.5 mmol/L (ref 3.5–5.1)
Sodium: 136 mmol/L (ref 135–145)

## 2022-02-23 LAB — GLUCOSE, CAPILLARY
Glucose-Capillary: 93 mg/dL (ref 70–99)
Glucose-Capillary: 115 mg/dL — ABNORMAL HIGH (ref 70–99)
Glucose-Capillary: 114 mg/dL — ABNORMAL HIGH (ref 70–99)
Glucose-Capillary: 99 mg/dL (ref 70–99)

## 2022-02-23 MED ORDER — OXYCODONE HCL ER 15 MG PO T12A
15.0000 mg | EXTENDED_RELEASE_TABLET | Freq: Two times a day (BID) | ORAL | Status: DC
  Administered 2022-02-23 – 2022-02-24 (×3): 15 mg via ORAL
  Filled 2022-02-23 (×3): qty 1

## 2022-02-23 MED ORDER — ACETAMINOPHEN 500 MG PO TABS
1000.0000 mg | ORAL_TABLET | Freq: Four times a day (QID) | ORAL | Status: DC
  Administered 2022-02-23 – 2022-02-26 (×11): 1000 mg via ORAL
  Filled 2022-02-23 (×13): qty 2

## 2022-02-23 NOTE — Progress Notes (Signed)
Patient ID: Carol Rivera, female   DOB: [birth year], 31 y.o.   MRN: [id number]  [facility] Progress Note   Assessment/ Plan:   1.  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 Would not do a biopsy as this will not change her overall management which is to treat the underlying infection (immunosuppression not indicated in such settings). -started HD given worsening kidney function, borderline UOP, persistent hyperkalemia. S/p RIJ temp line 5/29 with IR. HD #1 on 5/30, #2 5/31, #3 6/1 (slow start protocol) -With catheter dysfunction, pulling air from arterial line.  Consulted IR for temp line replacement, planning to be done tomorrow AM.  Next HD tentatively planned for Monday after her new catheter is placed. -no evidence of renal recovery, will need to consider converting her temp line to a TDC as her WBC improves. -monitor strict I/O 2.  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 S/p I&D of left AC septic arthritis on 5/22. On daptomycin and ceftaroline. Repeat bcx on 5/25 neg. Not a candidate for outpatient Abx 3.  Anemia: Likely secondary to acute/critical illness. Possible hemolytic anemia especially on Teflaro? Elevated LDH with negative direct Coombs test and pending haptoglobin (still). Transfuse prn, avoid IV iron, hgb stable today. Will hold off on iron given metastatic infection, Aranesp start 6/3. Hgb improved w/ 2U PRBC 6/3, Hgb 8.7 today 4.  Anasarca/hypoalbuminemia: with significant proteinruia (nephrotic range from staph assc'd GN). Push protein. Will UF as tolerated 5.  Hyperkalemia: Secondary to acute kidney injury, resolved w/ starting HD, Low K diet 6. CKD MBD -corrected Cal WNL. PTH ordered,on renvela. Phos likely jumped up due to lack of  adequate HD  Subjective:   Patient seen and examined bedside.  Required 2U PRBC for hgb 6.1. Hgb 8.7 today. Patient's only complaint today is ongoing back pain (pain mgmt per primary service). She otherwise denies any chest pain, worsening swelling/SOB.   Objective:   BP 96/74 (BP Location: Left Wrist)   Pulse 79   Temp 98.4 F (36.9 C) (Oral)   Resp 20   Ht 5' (1.524 m)   Wt 72.9 kg   SpO2 96%   BMI 31.39 kg/m   Intake/Output Summary (Last 24 hours) at 02/23/2022 1036 Last data filed at 02/23/2022 0300 Gross per 24 hour  Intake 1788 ml  Output --  Net 1788 ml   Weight change: -2.5 kg  Physical Exam: Gen: sitting up in bed eating breakfast, NAD CVS: Pulse regular rhythm, normal rate, 3/6 holosystolic murmur Resp:  normal WOB Abd: Soft, flat, nontender, bowel sounds normal Ext: Trace lower extremity/upper extremity edema Dialysis access: RIJ temp line  Imaging: No results found.  Labs: BMET Recent Labs  Lab 02/17/22 0702 02/18/22 0144 02/19/22 0047 02/20/22 0440 02/21/22 0410 02/22/22 0307 02/23/22 0249  NA 135 136 136 136 136 136 136  K 5.9* 5.2* 5.1 5.3* 4.3 4.7 4.5  CL 107 106 106 104 101 103 102  CO2 18* 19* 21* 21* 24 24 23   GLUCOSE 87 90 90 91 94 96 103*  BUN 80* 71* 67* 81* 50* 61* 60*  CREATININE 4.70* 4.54* 4.55* 5.33* 3.92* 4.50* 4.41*  CALCIUM 8.0* 7.1* 7.2* 7.3* 7.1* 7.3* 7.3*  PHOS 10.5* 9.7* 9.6* 10.1* 7.1* 8.3* 8.1*   CBC Recent Labs  Lab 02/18/22 0144 02/19/22 0047 02/22/22 1328 02/22/22  1421 02/23/22 0249  WBC 16.1* 16.7* 13.9*  --  14.8*  HGB 8.4* 8.1* 6.1* 5.8* 8.7*  HCT 25.1* 25.2* 18.8* 18.2* 25.5*  MCV 72.3* 73.7* 75.2*  --  78.5*  PLT 537* 518* 364  --  363    Medications:     acetaminophen  1,000 mg Oral Q6H   bisacodyl  10 mg Oral Daily   Chlorhexidine Gluconate Cloth  6 each Topical Q0600   Chlorhexidine Gluconate Cloth  6 each Topical Q0600   clonazePAM  0.5 mg Oral BID   darbepoetin (ARANESP) injection - DIALYSIS   100 mcg Intravenous Q Sat-HD   docusate sodium  100 mg Oral BID   [START ON 03/28/2022] linezolid  600 mg Oral Q12H   metoprolol tartrate  12.5 mg Oral BID   mupirocin ointment   Nasal BID   nicotine  14 mg Transdermal Daily   oxyCODONE  15 mg Oral Q12H   QUEtiapine  25 mg Oral QHS   sevelamer carbonate  1,600 mg Oral TID WC   sodium chloride flush  10-40 mL Intracatheter Q12H   sodium chloride flush  3 mL Intravenous Q12H   sodium polystyrene  30 g Rectal Once   Gean Quint, MD [facility] 02/23/2022, 10:36 AM

## 2022-02-23 NOTE — Progress Notes (Addendum)
PROGRESS NOTE    Carol Rivera Vise  ZOX:[id number] DOB: [birth year] DOA: 02/06/2022 PCP: Oneita HurtPcp, No   Brief Narrative:  Carol Rivera Akre is a 31 y.o. female with a history of IVDU who presented 5/18 with fever, cough, chest pain and left shoulder pain. She was found to have septic shock due to MRSA bacteremia as well as large destructive TV endocarditis with TR. IV antibiotics were continued and tailored to cultures which were persistently positive until 5/25 which have remain NGTD. CT surgery stated the location (ventricular side) of the vegetations is not accessible by angiovac and she is not a candidate for valve replacement. IV daptomycin and ceftaroline have been continued per ID recommendations. Course complicated by left AC septic joint s/p I&D 5/22, anemia of acute illness s/p 1u PRBCs 5/27, and renal failure with anasarca for which dialysis will be initiated through right IJ temporary dialysis catheter placed 02/17/2022.   Assessment & Plan:   Principal Problem:   MRSA bacteremia Active Problems:   Sepsis (HCC)   Hyponatremia   Elevated LFTs   IVDU (intravenous drug user)   Left shoulder pain   Chest pain, pleuritic   Septic pulmonary embolism without acute cor pulmonale (HCC)   Endocarditis of tricuspid valve   Staphylococcal arthritis of left shoulder (HCC)   Osteomyelitis (HCC)   Normocytic anemia   Microcytic anemia   Hypoalbuminemia   AKI (acute kidney injury) (HCC)   Septic arthritis of AC joint (HCC)  MRSA bacteremia with multifocal infections(multiple areas osteomyelitis/septic arthritis), septic pulmonary emboli, tricuspid valve endocarditis with TR: CT surgery eval 5/22 not accessible via angiovac, and not a candidate for valve replacement given recent IV drug abuse and valve destruction.  - TEE 5/23: large mobile TV posterior leaflet vegetation with destruction of the leaflet and "torrential tricuspid insufficiency." - No current indication for intervention per  discussion with cardiology - Blood cultures were persistently positive, though 5/25 set is NGTD.  - Plan to continue daptomycin x 6 weeks from negative cultures - through 03/27/22 - thereafter transition to linezolid on discharge to complete 8 weeks of treatment through 04/10/22   Intractable pain secondary to recent surgery, bacteremia, multifocal infections and septic arthritis/osteomyelitis  - Transition to scheduled OxyContin 50 mg twice daily, continue oxycodone IR for breakthrough pain ongoing - Discontinue IV narcotics; continue to hold given history and need for discharge planning. - *High risk for IV drug abuse, unfortunately patient states her pain is still poorly controlled despite being quite somnolent with borderline hypotension.  We discussed that she will not be pain-free during his hospitalization and that our goal is to maintain pain control postoperatively and as infection resolves.  If her blood pressure continues to be low we will need to decrease her narcotic burden yet again.  Anemia of acute illness vs ESRD Rule out acute blood loss  - Acutely worsening 02/22/22 - 6.1/5.8 on recheck - 2u PRBC planned - repeat Hgb 8.5 - Transfusion 5/27(x1); 6/3(x2) - 3u total since admission - No active bleeding. Coombs negative.   Acute renal failure: With hyperkalemia, metabolic acidosis, declining urine output, suspect she may require HD for which nephrology has consulted IR for access. Renal U/S with increased echogenicity, no hydro. - [facility], had 1st HD same day, 2nd 5/30 scheduled. - Nephrology following - continue HD per their expertise - Monitor UOP.   Hyperkalemia, resolved:  -Managed with dialysis   Wheezing: Resolved. - Continue albuterol PRN - Likely secondary to volume  overload given above   T11 osteomyelitis:  - T2-hyperintensity on imaging, suspect new osteomyelitis.  - Dr. Maurice Small, neurosurgery evaluated the patient on 5/26, recommending  continuation of antibiotics without acute neurosurgical intervention. - IV antibiotics ongoing as above   Left AC septic arthritis:  - S/P I&D 5/22.  - Wound care per orthopedics. Sutures removed 02/23/22.   IVDU:  - Cessation counseling provided.    Acute metabolic encephalopathy:  -Resolved -Continue supportive seroquel and clonazepam.    Obesity:  - Estimated body mass index is 32.29 kg/m as calculated from the following: - Height as of this encounter: 5' (1.524 m). - Weight as of this encounter: 75 kg.  DVT prophylaxis: Early ambulation Code Status: Full Family Communication: None present  Status is: Inpatient  Dispo: The patient is from: Home            Anticipated d/c is to: Home            Anticipated d/c date is: Pending completion of antibiotics as above -likely require 6 weeks of IV antibiotics in total            Patient currently not medically stable for discharge  Consultants:  ID, Orthopedic surgery, Nephrology  Antimicrobials:  Daptomycin/ceftaroline per ID  Subjective: No acute issues or events overnight denies nausea vomiting diarrhea constipation headache fevers chills or chest pain -still complaining of diffuse shoulder/back/neck pain despite increased narcotics.  Objective: Vitals:   02/22/22 2330 02/22/22 2332 02/23/22 0230 02/23/22 0641  BP: 109/79  96/79 110/81  Pulse: 84  76 80  Resp: (!) 21  20 (!) 23  Temp: 98.6 F (37 C) 98.6 F (37 C) 98.3 F (36.8 C)   TempSrc: Oral Oral Oral   SpO2: 98%  96% 95%  Weight:      Height:        Intake/Output Summary (Last 24 hours) at 02/23/2022 0726 Last data filed at 02/23/2022 0300 Gross per 24 hour  Intake 1888 ml  Output --  Net 1888 ml    Filed Weights   02/22/22 0500 02/22/22 1028 02/22/22 1153  Weight: 75.9 kg 73.4 kg 72.9 kg    Examination:  General:  Pleasantly resting in bed, No acute distress. HEENT:  Normocephalic atraumatic.  Sclerae nonicteric, noninjected.  Extraocular  movements intact bilaterally. Neck: Right IJ hemodialysis catheter. Lungs:  Clear to auscultate bilaterally without rhonchi, wheeze, or rales. Heart:  Regular rate and rhythm.  Without murmurs, rubs, or gallops. Abdomen:  Soft, nontender, nondistended.  Without guarding or rebound. Extremities: Limited range of motion left upper extremity  Vascular:  Dorsalis pedis and posterior tibial pulses palpable bilaterally.  Data Reviewed: I have personally reviewed following labs and imaging studies  CBC: Recent Labs  Lab 02/17/22 0702 02/18/22 0144 02/19/22 0047 02/22/22 1328 02/22/22 1421 02/23/22 0249  WBC 17.6* 16.1* 16.7* 13.9*  --  14.8*  HGB 8.6* 8.4* 8.1* 6.1* 5.8* 8.7*  HCT 25.8* 25.1* 25.2* 18.8* 18.2* 25.5*  MCV 72.5* 72.3* 73.7* 75.2*  --  78.5*  PLT 574* 537* 518* 364  --  363    Basic Metabolic Panel: Recent Labs  Lab 02/19/22 0047 02/20/22 0440 02/21/22 0410 02/22/22 0307 02/23/22 0249  NA 136 136 136 136 136  K 5.1 5.3* 4.3 4.7 4.5  CL 106 104 101 103 102  CO2 21* 21* 24 24 23   GLUCOSE 90 91 94 96 103*  BUN 67* 81* 50* 61* 60*  CREATININE 4.55* 5.33* 3.92* 4.50* 4.41*  CALCIUM 7.2* 7.3* 7.1* 7.3* 7.3*  PHOS 9.6* 10.1* 7.1* 8.3* 8.1*    GFR: Estimated Creatinine Clearance: 16.5 mL/min (A) (by C-G formula based on SCr of 4.41 mg/dL (H)). Liver Function Tests: Recent Labs  Lab 02/19/22 0047 02/20/22 0440 02/21/22 0410 02/22/22 0307 02/23/22 0249  ALBUMIN 1.6* 1.5* 1.6* 1.5* <1.5*    No results for input(s): LIPASE, AMYLASE in the last 168 hours. No results for input(s): AMMONIA in the last 168 hours. Coagulation Profile: No results for input(s): INR, PROTIME in the last 168 hours. Cardiac Enzymes: No results for input(s): CKTOTAL, CKMB, CKMBINDEX, TROPONINI in the last 168 hours.  BNP (last 3 results) No results for input(s): PROBNP in the last 8760 hours. HbA1C: No results for input(s): HGBA1C in the last 72 hours. CBG: Recent Labs  Lab  02/21/22 1553 02/21/22 2054 02/22/22 1226 02/22/22 1557 02/22/22 2043  GLUCAP 89 144* 95 96 115*    Lipid Profile: No results for input(s): CHOL, HDL, LDLCALC, TRIG, CHOLHDL, LDLDIRECT in the last 72 hours. Thyroid Function Tests: No results for input(s): TSH, T4TOTAL, FREET4, T3FREE, THYROIDAB in the last 72 hours. Anemia Panel: No results for input(s): VITAMINB12, FOLATE, FERRITIN, TIBC, IRON, RETICCTPCT in the last 72 hours.  Sepsis Labs: No results for input(s): PROCALCITON, LATICACIDVEN in the last 168 hours.  No results found for this or any previous visit (from the past 240 hour(s)).         Radiology Studies: No results found.      Scheduled Meds:  acetaminophen  1,000 mg Oral Q6H   bisacodyl  10 mg Oral Daily   Chlorhexidine Gluconate Cloth  6 each Topical Q0600   Chlorhexidine Gluconate Cloth  6 each Topical Q0600   clonazePAM  0.5 mg Oral BID   darbepoetin (ARANESP) injection - DIALYSIS  100 mcg Intravenous Q Sat-HD   docusate sodium  100 mg Oral BID   [START ON 03/28/2022] linezolid  600 mg Oral Q12H   metoprolol tartrate  12.5 mg Oral BID   mupirocin ointment   Nasal BID   nicotine  14 mg Transdermal Daily   QUEtiapine  25 mg Oral QHS   sevelamer carbonate  1,600 mg Oral TID WC   sodium chloride flush  10-40 mL Intracatheter Q12H   sodium chloride flush  3 mL Intravenous Q12H   sodium polystyrene  30 g Rectal Once   Continuous Infusions:  ceFTAROline (TEFLARO) IV 200 mg (02/22/22 2217)   DAPTOmycin (CUBICIN)  IV 500 mg (02/22/22 1938)   methocarbamol (ROBAXIN) IV      LOS: 17 days   Time spent:  Azucena Fallen, DO [facility]  If 7PM-7AM, please contact night-coverage [email or web address]  02/23/2022, 7:26 AM

## 2022-02-24 ENCOUNTER — Inpatient Hospital Stay (HOSPITAL_COMMUNITY): Payer: Medicaid Other

## 2022-02-24 HISTORY — PX: IR FLUORO GUIDE CV LINE RIGHT: IMG2283

## 2022-02-24 LAB — RENAL FUNCTION PANEL
Albumin: <1.5 g/dL — ABNORMAL LOW (ref 3.5–5.0)
Anion gap: 10 (ref 5–15)
BUN: 66 mg/dL — ABNORMAL HIGH (ref 6–20)
CO2: 23 mmol/L (ref 22–32)
Calcium: 7.6 mg/dL — ABNORMAL LOW (ref 8.9–10.3)
Chloride: 104 mmol/L (ref 98–111)
Creatinine, Ser: 5.22 mg/dL — ABNORMAL HIGH (ref 0.44–1.00)
GFR, Estimated: 11 mL/min — ABNORMAL LOW (ref >60)
Glucose, Bld: 107 mg/dL — ABNORMAL HIGH (ref 70–99)
Phosphorus: 8.5 mg/dL — ABNORMAL HIGH (ref 2.5–4.6)
Potassium: 4.5 mmol/L (ref 3.5–5.1)
Sodium: 137 mmol/L (ref 135–145)

## 2022-02-24 LAB — CBC
HCT: 25.3 % — ABNORMAL LOW (ref 36.0–46.0)
Hemoglobin: 8.3 g/dL — ABNORMAL LOW (ref 12.0–15.0)
MCH: 26.3 pg (ref 26.0–34.0)
MCHC: 32.8 g/dL (ref 30.0–36.0)
MCV: 80.3 fL (ref 80.0–100.0)
Platelets: 321 10*3/uL (ref 150–400)
RBC: 3.15 MIL/uL — ABNORMAL LOW (ref 3.87–5.11)
RDW: 22.2 % — ABNORMAL HIGH (ref 11.5–15.5)
WBC: 13.1 10*3/uL — ABNORMAL HIGH (ref 4.0–10.5)
nRBC: 0 % (ref 0.0–0.2)

## 2022-02-24 LAB — GLUCOSE, CAPILLARY
Glucose-Capillary: 127 mg/dL — ABNORMAL HIGH (ref 70–99)
Glucose-Capillary: 125 mg/dL — ABNORMAL HIGH (ref 70–99)
Glucose-Capillary: 134 mg/dL — ABNORMAL HIGH (ref 70–99)
Glucose-Capillary: 78 mg/dL (ref 70–99)

## 2022-02-24 LAB — PARATHYROID HORMONE, INTACT (NO CA): PTH: 146 pg/mL — ABNORMAL HIGH (ref 15–65)

## 2022-02-24 LAB — CK: Total CK: 12 U/L — ABNORMAL LOW (ref 38–234)

## 2022-02-24 IMAGING — XA IR FLUORO GUIDE CV LINE*R*
1 series · 1 of 1 positions shown · non-contrast
Comparison: none

INDICATION: Air leak from previously placed right

[Series 2: fl angio · 1 of 1 slices shown]
[im 1/1]
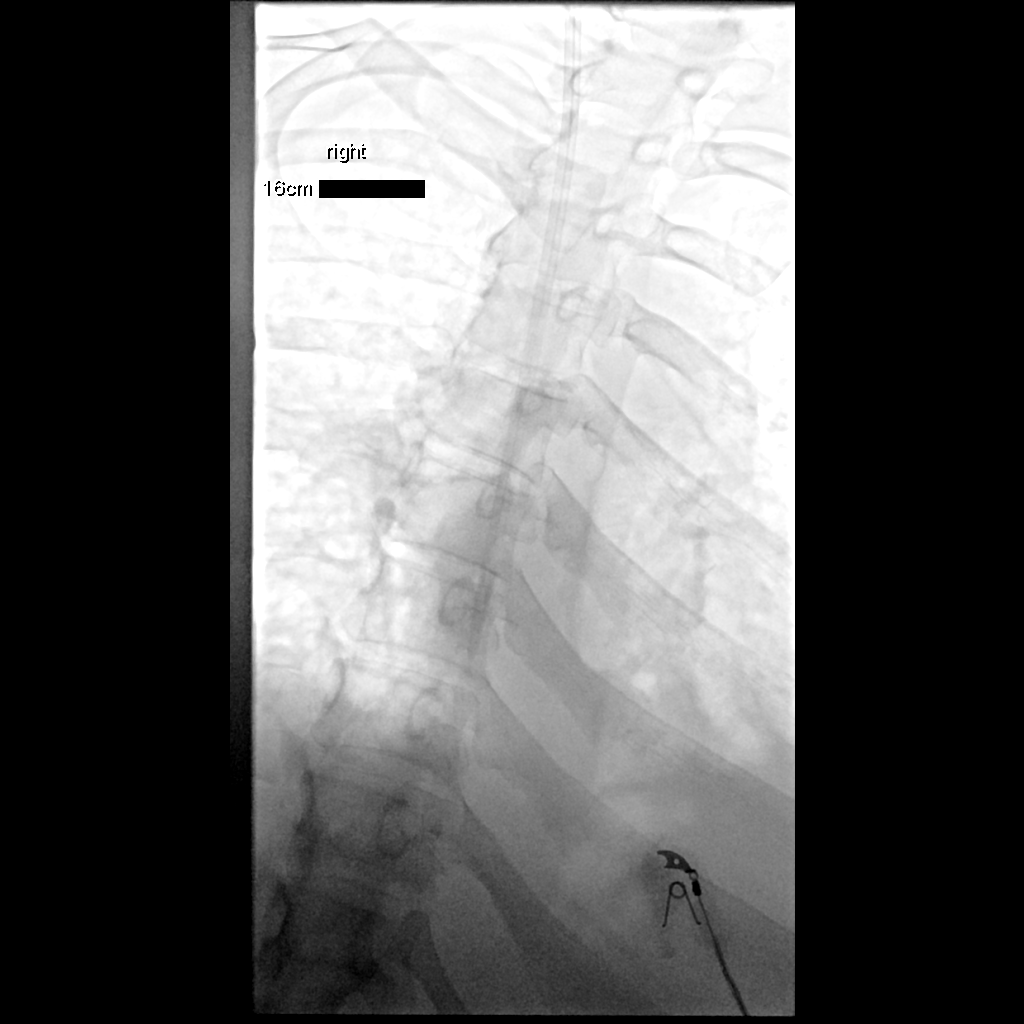

[1 of 1 positions shown; findings below may reference images not displayed]

IJ Trialysis catheter

EXAM:
CENTRAL VENOUS HEMODIALYSIS CATHETER EXCHANGE WITH FLUOROSCOPIC
GUIDANCE

MEDICATIONS:
no antibiotics were indicated

ANESTHESIA/SEDATION:
None required

FLUOROSCOPY:
Radiation Exposure Index (as provided by the fluoroscopic device): 5
mGy Kerma

COMPLICATIONS:
None immediate.

PROCEDURE:
Informed written consent was obtained from the patient after a
discussion of the risks, benefits, and alternatives to treatment.
Questions regarding the procedure were encouraged and answered. The
right IJ catheter and surrounding neck and chest were prepped with
chlorhexidine in a sterile fashion, and a sterile drape was applied
covering the operative field. Maximum barrier sterile technique with
sterile gowns and gloves were used for the procedure. A timeout was
performed prior to the initiation of the procedure.

1% lidocaine was administered subcutaneously around the catheter.
Catheter was exchanged over an Amplatz wire for a new 16 cm
Trialysis catheter, positioned with its tip in the proximal right
atrium.

Final catheter positioning was confirmed and documented with a spot
radiographic image. The catheter aspirates and flushes normally. The
catheter was flushed with appropriate volume heparin dwells.

The catheter exit site was secured with a 0-Prolene retention
suture. The patient tolerated the procedure well without immediate
post procedural complication.
IMPRESSION: Successful exchange of 16 cm Trialysis hemodialysis catheter in the
right internal jugular vein with tips terminating within the
superior aspect of the right atrium. The catheter is ready for
immediate use.

## 2022-02-24 MED ORDER — OXYCODONE HCL ER 10 MG PO T12A
10.0000 mg | EXTENDED_RELEASE_TABLET | Freq: Two times a day (BID) | ORAL | Status: DC
  Administered 2022-02-24 – 2022-02-26 (×4): 10 mg via ORAL
  Filled 2022-02-24 (×4): qty 1

## 2022-02-24 MED ORDER — OXYCODONE HCL 5 MG PO TABS
5.0000 mg | ORAL_TABLET | ORAL | Status: DC | PRN
  Administered 2022-02-24 – 2022-02-26 (×6): 5 mg via ORAL
  Filled 2022-02-24 (×7): qty 1

## 2022-02-24 MED ORDER — DARBEPOETIN ALFA 100 MCG/0.5ML IJ SOSY
100.0000 ug | PREFILLED_SYRINGE | INTRAMUSCULAR | Status: DC
  Administered 2022-02-24: 100 ug via INTRAVENOUS
  Filled 2022-02-24: qty 0.5

## 2022-02-24 MED ORDER — HEPARIN SODIUM (PORCINE) 1000 UNIT/ML IJ SOLN
INTRAMUSCULAR | Status: AC
  Filled 2022-02-24: qty 4

## 2022-02-24 MED ORDER — HEPARIN SODIUM (PORCINE) 1000 UNIT/ML IJ SOLN
INTRAMUSCULAR | Status: AC
  Administered 2022-02-24: 2.6 mL
  Filled 2022-02-24: qty 10

## 2022-02-24 MED ORDER — LIDOCAINE HCL (PF) 2 % IJ SOLN
INTRAMUSCULAR | Status: AC
  Filled 2022-02-24: qty 10

## 2022-02-24 MED ORDER — LIDOCAINE HCL 1 % IJ SOLN
INTRAMUSCULAR | Status: AC
  Administered 2022-02-24: 10 mL
  Filled 2022-02-24: qty 20

## 2022-02-24 MED ORDER — CHLORHEXIDINE GLUCONATE 4 % EX LIQD
CUTANEOUS | Status: AC
  Filled 2022-02-24: qty 15

## 2022-02-24 NOTE — Progress Notes (Signed)
Patient ID: Carol Rivera, female   DOB: [birth year], 31 y.o.   MRN: [id number] S: No events last night. O:BP 99/72 (BP Location: Right Arm)   Pulse 83   Temp 98 F (36.7 C) (Oral)   Resp 15   Ht 5' (1.524 m)   Wt 77.1 kg   SpO2 94%   BMI 33.20 kg/m   Intake/Output Summary (Last 24 hours) at 02/24/2022 1422 Last data filed at 02/24/2022 2683 Gross per 24 hour  Intake 560 ml  Output --  Net 560 ml   Intake/Output: I/O last 3 completed shifts: In: 2121 [P.O.:800; Blood:721; IV Piggyback:600] Out: -   Intake/Output this shift:  Total I/O In: 240 [P.O.:240] Out: -  Weight change: 3.7 kg MHD:[id number] and lying in bed. CVS: RRR, III/VI holosystolic murmer Resp:CTA Abd: +BS, soft, NT/ND Ext: trace edema  Recent Labs  Lab 02/18/22 0144 02/19/22 0047 02/20/22 0440 02/21/22 0410 02/22/22 0307 02/23/22 0249 02/24/22 0441  NA 136 136 136 136 136 136 137  K 5.2* 5.1 5.3* 4.3 4.7 4.5 4.5  CL 106 106 104 101 103 102 104  CO2 19* 21* 21* 24 24 23 23   GLUCOSE 90 90 91 94 96 103* 107*  BUN 71* 67* 81* 50* 61* 60* 66*  CREATININE 4.54* 4.55* 5.33* 3.92* 4.50* 4.41* 5.22*  ALBUMIN 1.6* 1.6* 1.5* 1.6* 1.5* <1.5* <1.5*  CALCIUM 7.1* 7.2* 7.3* 7.1* 7.3* 7.3* 7.6*  PHOS 9.7* 9.6* 10.1* 7.1* 8.3* 8.1* 8.5*   Liver Function Tests: Recent Labs  Lab 02/22/22 0307 02/23/22 0249 02/24/22 0441  ALBUMIN 1.5* <1.5* <1.5*   No results for input(s): LIPASE, AMYLASE in the last 168 hours. No results for input(s): AMMONIA in the last 168 hours. CBC: Recent Labs  Lab 02/18/22 0144 02/19/22 0047 02/22/22 1328 02/22/22 1421 02/23/22 0249 02/24/22 0441  WBC 16.1* 16.7* 13.9*  --  14.8* 13.1*  HGB 8.4* 8.1* 6.1* 5.8* 8.7* 8.3*  HCT 25.1* 25.2* 18.8* 18.2* 25.5* 25.3*  MCV 72.3* 73.7* 75.2*  --  78.5* 80.3  PLT 537* 518* 364  --  363 321   Cardiac Enzymes: Recent Labs  Lab 02/24/22 0441  CKTOTAL 12*   CBG: Recent Labs  Lab 02/23/22 1212 02/23/22 1540 02/23/22 2101  02/24/22 0819 02/24/22 1214  GLUCAP 115* 114* 99 125* 134*    Iron Studies: No results for input(s): IRON, TIBC, TRANSFERRIN, FERRITIN in the last 72 hours. Studies/Results: No results found.  acetaminophen  1,000 mg Oral Q6H   bisacodyl  10 mg Oral Daily   chlorhexidine       Chlorhexidine Gluconate Cloth  6 each Topical Q0600   clonazePAM  0.5 mg Oral BID   darbepoetin (ARANESP) injection - DIALYSIS  100 mcg Intravenous Q Mon-HD   docusate sodium  100 mg Oral BID   lidocaine HCl (PF)       [START ON 03/28/2022] linezolid  600 mg Oral Q12H   metoprolol tartrate  12.5 mg Oral BID   mupirocin ointment   Nasal BID   nicotine  14 mg Transdermal Daily   oxyCODONE  10 mg Oral Q12H   QUEtiapine  25 mg Oral QHS   sevelamer carbonate  1,600 mg Oral TID WC   sodium chloride flush  10-40 mL Intracatheter Q12H   sodium chloride flush  3 mL Intravenous Q12H   sodium polystyrene  30 g Rectal Once    BMET    Component Value Date/Time   NA 137 02/24/2022 0441  K 4.5 02/24/2022 0441   CL 104 02/24/2022 0441   CO2 23 02/24/2022 0441   GLUCOSE 107 (H) 02/24/2022 0441   BUN 66 (H) 02/24/2022 0441   CREATININE 5.22 (H) 02/24/2022 0441   CALCIUM 7.6 (L) 02/24/2022 0441   GFRNONAA 11 (L) 02/24/2022 0441   GFRAA >60 11/30/2015 2108   CBC    Component Value Date/Time   WBC 13.1 (H) 02/24/2022 0441   RBC 3.15 (L) 02/24/2022 0441   HGB 8.3 (L) 02/24/2022 0441   HCT 25.3 (L) 02/24/2022 0441   PLT 321 02/24/2022 0441   MCV 80.3 02/24/2022 0441   MCH 26.3 02/24/2022 0441   MCHC 32.8 02/24/2022 0441   RDW 22.2 (H) 02/24/2022 0441   LYMPHSABS 1.2 02/06/2022 2020   MONOABS 1.5 (H) 02/06/2022 2020   EOSABS 0.0 02/06/2022 2020   BASOSABS 0.1 02/06/2022 2020     Assessment/Plan: 1.  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 Would not do a biopsy as this will not  change her overall management which is to treat the underlying infection (immunosuppression not indicated in such settings). -started HD given worsening kidney function, borderline UOP, persistent hyperkalemia. S/p RIJ temp line 5/29 with IR. HD #1 on 5/30, #2 5/31, #3 6/1 (slow start protocol) -catheter dysfunction, pulling air from arterial line.  IR consulted for a new temp line placement today.  Next HD tentatively planned today after her new catheter is placed. -no evidence of renal recovery, will need to consider converting her temp line to a TDC as her WBC improves. -monitor strict I/O 2.  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 S/p I&D of left AC septic arthritis on 5/22. On daptomycin and ceftaroline. Repeat bcx on 5/25 neg. Not a candidate for outpatient Abx 3.  Anemia: Likely secondary to acute/critical illness. Possible hemolytic anemia especially on Teflaro? Elevated LDH with negative direct Coombs test and pending haptoglobin (still). Transfuse prn, avoid IV iron, hgb stable today. Will hold off on iron given metastatic infection, Aranesp start 6/3. Hgb improved w/ 2U PRBC 6/3, Hgb 8.7 today 4.  Anasarca/hypoalbuminemia: with significant proteinruia (nephrotic range from staph assc'd GN). Push protein. Will UF as tolerated 5.  Hyperkalemia: Secondary to acute kidney injury, resolved w/ starting HD, Low K diet 6. CKD MBD -corrected Cal WNL. PTH ordered,on renvela. Phos likely jumped up due to lack of adequate HD  Irena Cords, MD [facility]

## 2022-02-24 NOTE — Procedures (Signed)
  Procedure:  Exchange R IJ TL HD CVC under fluoro 16cm Preprocedure diagnosis: The primary encounter diagnosis was Sepsis with acute organ dysfunction and septic shock, due to unspecified organism, unspecified type (HCC). A diagnosis of Sepsis (HCC) was also pertinent to this visit.  Postprocedure diagnosis: same EBL:    minimal Complications:   none immediate  See full dictation in [employer].  Thora Lance MD Main # [phone] Pager  [phone] Mobile [phone]

## 2022-02-24 NOTE — Progress Notes (Signed)
PROGRESS NOTE    Carol BoozeJennifer L Tarry  JYN:[id number] DOB: 1991-02-18 DOA: 02/06/2022 PCP: Oneita HurtPcp, No   Brief Narrative:  Carol BoozeJennifer L Potash is a 31 y.o. female with a history of IVDU who presented 5/18 with fever, cough, chest pain and left shoulder pain. She was found to have septic shock due to MRSA bacteremia as well as large destructive TV endocarditis with TR. IV antibiotics were continued and tailored to cultures which were persistently positive until 5/25 which have remain NGTD. CT surgery stated the location (ventricular side) of the vegetations is not accessible by angiovac and she is not a candidate for valve replacement. IV daptomycin and ceftaroline have been continued per ID recommendations. Course complicated by left AC septic joint s/p I&D 5/22, anemia of acute illness s/p 1u PRBCs 5/27, and renal failure with anasarca for which dialysis will be initiated through right IJ temporary dialysis catheter placed 02/17/2022.   Assessment & Plan:   Principal Problem:   MRSA bacteremia Active Problems:   Sepsis (HCC)   Hyponatremia   Elevated LFTs   IVDU (intravenous drug user)   Left shoulder pain   Chest pain, pleuritic   Septic pulmonary embolism without acute cor pulmonale (HCC)   Endocarditis of tricuspid valve   Staphylococcal arthritis of left shoulder (HCC)   Osteomyelitis (HCC)   Normocytic anemia   Microcytic anemia   Hypoalbuminemia   AKI (acute kidney injury) (HCC)   Septic arthritis of AC joint (HCC)   MRSA bacteremia with multifocal infections(multiple areas osteomyelitis/septic arthritis), septic pulmonary emboli, tricuspid valve endocarditis with TR: CT surgery eval 5/22 not accessible via angiovac, and not a candidate for valve replacement given recent IV drug abuse and valve destruction.  - TEE 5/23: large mobile TV posterior leaflet vegetation with destruction of the leaflet and "torrential tricuspid insufficiency." - No current indication for intervention per  discussion with cardiology - Blood cultures were persistently positive, though 5/25 set is NGTD.  - Plan to continue daptomycin x 6 weeks from negative cultures - through 03/27/22 - thereafter transition to linezolid on discharge to complete 8 weeks of treatment through 04/10/22   Intractable pain secondary to recent surgery, bacteremia, multifocal infections and septic arthritis/osteomyelitis  - Transition to scheduled OxyContin 15 mg twice daily, continue oxycodone IR 5-10mg  for breakthrough pain - Decreasing OxyContin to 10 mg and Oxy IR to 5 mg given increased somnolence and borderline low blood pressure -Patient continues to request for IV narcotics, specifically Dilaudid which we discussed was not appropriate at this time and that we would be decreasing her regimen as above given her ongoing somnolence and ongoing low blood pressure - *High risk for IV drug abuse, unfortunately patient states her pain is still poorly controlled despite being quite somnolent with borderline hypotension.   Anemia of acute illness vs ESRD, stabilizing Rule out acute blood loss  - Hemoglobin stabilizing - Transfusion 5/27(x1); 6/3(x2) - 3u total since admission - No active bleeding. Coombs negative.   Acute renal failure: With hyperkalemia, metabolic acidosis, declining urine output, suspect she may require HD for which nephrology has consulted IR for access. Renal U/S with increased echogenicity, no hydro. - [facility] same [facility], 2nd 5/30 scheduled. - Nephrology following - continue HD per their expertise - Monitor UOP.   Hyperkalemia, resolved:  -Managed with dialysis   Wheezing: Resolved. - Continue albuterol PRN - Likely secondary to volume overload given above   T11 osteomyelitis:  - T2-hyperintensity on imaging,  consistent with osteomyelitis.  - Dr. Maurice Small, neurosurgery evaluated the patient on 5/26, recommending continuation of antibiotics without acute neurosurgical  intervention. - IV antibiotics ongoing as above   Left AC septic arthritis:  - S/P I&D 5/22.  - Wound care per orthopedics. Sutures removed 02/23/22.   IVDU:  - Cessation counseling provided.    Acute metabolic encephalopathy:  -Resolved -Continue supportive seroquel and clonazepam.    Obesity:  - Estimated body mass index is 32.29 kg/m as calculated from the following: - Height as of this encounter: 5' (1.524 m). - Weight as of this encounter: 75 kg.  DVT prophylaxis: Early ambulation Code Status: Full Family Communication: None present  Status is: Inpatient  Dispo: The patient is from: Home            Anticipated d/c is to: Home            Anticipated d/c date is: Pending completion of antibiotics as above -likely require 6 weeks of IV antibiotics in total            Patient currently not medically stable for discharge  Consultants:  ID, Orthopedic surgery, Nephrology  Antimicrobials:  Daptomycin/ceftaroline per ID  Subjective: No acute issues or events overnight patient markedly somnolent this morning, borderline hypotension, we discussed needing to wean her narcotics.  She states her pain is ongoing and uncontrolled diffusely in her back neck arms.  We discussed need for ambulation as she appears to be quite sedentary which is not helping her mobility or her likely musculoskeletal pain in the setting of prolonged downtime in bed.  Objective: Vitals:   02/23/22 1926 02/23/22 2303 02/24/22 0314 02/24/22 0435  BP: (!) 124/94 104/68 100/72   Pulse: 86 81 79   Resp: 20 20 15 20   Temp: 97.8 F (36.6 C) 97.8 F (36.6 C) 97.8 F (36.6 C)   TempSrc: Oral Oral Oral   SpO2:      Weight:    77.1 kg  Height:        Intake/Output Summary (Last 24 hours) at 02/24/2022 0731 Last data filed at 02/24/2022 0122 Gross per 24 hour  Intake 640 ml  Output --  Net 640 ml    Filed Weights   02/22/22 1028 02/22/22 1153 02/24/22 0435  Weight: 73.4 kg 72.9 kg 77.1 kg     Examination:  General:  Pleasantly resting in bed, No acute distress.  Somnolent, arousable. HEENT:  Normocephalic atraumatic.  Sclerae nonicteric, noninjected.  Extraocular movements intact bilaterally. Neck: Right IJ hemodialysis catheter. Lungs:  Clear to auscultate bilaterally without rhonchi, wheeze, or rales. Heart:  Regular rate and rhythm.  Without murmurs, rubs, or gallops. Abdomen:  Soft, nontender, nondistended.  Without guarding or rebound. Extremities: Limited range of motion diffusely secondary to generalized pain Vascular:  Dorsalis pedis and posterior tibial pulses palpable bilaterally.  Data Reviewed: I have personally reviewed following labs and imaging studies  CBC: Recent Labs  Lab 02/18/22 0144 02/19/22 0047 02/22/22 1328 02/22/22 1421 02/23/22 0249 02/24/22 0441  WBC 16.1* 16.7* 13.9*  --  14.8* 13.1*  HGB 8.4* 8.1* 6.1* 5.8* 8.7* 8.3*  HCT 25.1* 25.2* 18.8* 18.2* 25.5* 25.3*  MCV 72.3* 73.7* 75.2*  --  78.5* 80.3  PLT 537* 518* 364  --  363 321    Basic Metabolic Panel: Recent Labs  Lab 02/20/22 0440 02/21/22 0410 02/22/22 0307 02/23/22 0249 02/24/22 0441  NA 136 136 136 136 137  K 5.3* 4.3 4.7 4.5 4.5  CL 104  101 103 102 104  CO2 21* 24 24 23 23   GLUCOSE 91 94 96 103* 107*  BUN 81* 50* 61* 60* 66*  CREATININE 5.33* 3.92* 4.50* 4.41* 5.22*  CALCIUM 7.3* 7.1* 7.3* 7.3* 7.6*  PHOS 10.1* 7.1* 8.3* 8.1* 8.5*    GFR: Estimated Creatinine Clearance: 14.3 mL/min (A) (by C-G formula based on SCr of 5.22 mg/dL (H)). Liver Function Tests: Recent Labs  Lab 02/20/22 0440 02/21/22 0410 02/22/22 0307 02/23/22 0249 02/24/22 0441  ALBUMIN 1.5* 1.6* 1.5* <1.5* <1.5*    No results for input(s): LIPASE, AMYLASE in the last 168 hours. No results for input(s): AMMONIA in the last 168 hours. Coagulation Profile: No results for input(s): INR, PROTIME in the last 168 hours. Cardiac Enzymes: Recent Labs  Lab 02/24/22 0441  CKTOTAL 12*    BNP  (last 3 results) No results for input(s): PROBNP in the last 8760 hours. HbA1C: No results for input(s): HGBA1C in the last 72 hours. CBG: Recent Labs  Lab 02/22/22 2043 02/23/22 0742 02/23/22 1212 02/23/22 1540 02/23/22 2101  GLUCAP 115* 93 115* 114* 99    Lipid Profile: No results for input(s): CHOL, HDL, LDLCALC, TRIG, CHOLHDL, LDLDIRECT in the last 72 hours. Thyroid Function Tests: No results for input(s): TSH, T4TOTAL, FREET4, T3FREE, THYROIDAB in the last 72 hours. Anemia Panel: No results for input(s): VITAMINB12, FOLATE, FERRITIN, TIBC, IRON, RETICCTPCT in the last 72 hours.  Sepsis Labs: No results for input(s): PROCALCITON, LATICACIDVEN in the last 168 hours.  No results found for this or any previous visit (from the past 240 hour(s)).         Radiology Studies: No results found.      Scheduled Meds:  acetaminophen  1,000 mg Oral Q6H   bisacodyl  10 mg Oral Daily   Chlorhexidine Gluconate Cloth  6 each Topical Q0600   clonazePAM  0.5 mg Oral BID   darbepoetin (ARANESP) injection - DIALYSIS  100 mcg Intravenous Q Sat-HD   docusate sodium  100 mg Oral BID   [START ON 03/28/2022] linezolid  600 mg Oral Q12H   metoprolol tartrate  12.5 mg Oral BID   mupirocin ointment   Nasal BID   nicotine  14 mg Transdermal Daily   oxyCODONE  15 mg Oral Q12H   QUEtiapine  25 mg Oral QHS   sevelamer carbonate  1,600 mg Oral TID WC   sodium chloride flush  10-40 mL Intracatheter Q12H   sodium chloride flush  3 mL Intravenous Q12H   sodium polystyrene  30 g Rectal Once   Continuous Infusions:  ceFTAROline (TEFLARO) IV 200 mg (02/24/22 0538)   DAPTOmycin (CUBICIN)  IV 500 mg (02/22/22 1938)   methocarbamol (ROBAXIN) IV      LOS: 18 days   Time spent: 04/24/22  , DO [facility]  If 7PM-7AM, please contact night-coverage [email or web address]  02/24/2022, 7:31 AM

## 2022-02-25 LAB — RENAL FUNCTION PANEL
Albumin: <1.5 g/dL — ABNORMAL LOW (ref 3.5–5.0)
Anion gap: 7 (ref 5–15)
BUN: 43 mg/dL — ABNORMAL HIGH (ref 6–20)
CO2: 23 mmol/L (ref 22–32)
Calcium: 7.3 mg/dL — ABNORMAL LOW (ref 8.9–10.3)
Chloride: 105 mmol/L (ref 98–111)
Creatinine, Ser: 3.54 mg/dL — ABNORMAL HIGH (ref 0.44–1.00)
GFR, Estimated: 17 mL/min — ABNORMAL LOW (ref >60)
Glucose, Bld: 83 mg/dL (ref 70–99)
Phosphorus: 5.9 mg/dL — ABNORMAL HIGH (ref 2.5–4.6)
Potassium: 4.4 mmol/L (ref 3.5–5.1)
Sodium: 135 mmol/L (ref 135–145)

## 2022-02-25 LAB — GLUCOSE, CAPILLARY
Glucose-Capillary: 93 mg/dL (ref 70–99)
Glucose-Capillary: 97 mg/dL (ref 70–99)
Glucose-Capillary: 90 mg/dL (ref 70–99)
Glucose-Capillary: 111 mg/dL — ABNORMAL HIGH (ref 70–99)

## 2022-02-25 NOTE — Progress Notes (Signed)
Patient ID: Rodney BoozeJennifer L Borg, female   DOB: [birth year], 31 y.o.   MRN: [id number] S: She remains somnolent today but now new complaints.  "Just tired".  Had temp RIJ HD catheter exchanged under fluoro yesterday and able to run with HD 02/24/22. O:BP 96/66 (BP Location: Left Arm)   Pulse 82   Temp 97.8 F (36.6 C) (Oral)   Resp 19   Ht 5' (1.524 m)   Wt 78.5 kg   SpO2 94%   BMI 33.80 kg/m   Intake/Output Summary (Last 24 hours) at 02/25/2022 1421 Last data filed at 02/24/2022 1830 Gross per 24 hour  Intake --  Output 1.1 ml  Net -1.1 ml   Intake/Output: I/O last 3 completed shifts: In: 460 [P.O.:240; IV Piggyback:220] Out: 1.1 [Other:1.1]  Intake/Output this shift:  No intake/output data recorded. Weight change: -1.1 kg Gen:NAD CVS: RRR Resp: CTA Abd: +BS, soft, NT/ND  Ext:no edema  Recent Labs  Lab 02/19/22 0047 02/20/22 0440 02/21/22 0410 02/22/22 0307 02/23/22 0249 02/24/22 0441 02/25/22 0214  NA 136 136 136 136 136 137 135  K 5.1 5.3* 4.3 4.7 4.5 4.5 4.4  CL 106 104 101 103 102 104 105  CO2 21* 21* 24 24 23 23 23   GLUCOSE 90 91 94 96 103* 107* 83  BUN 67* 81* 50* 61* 60* 66* 43*  CREATININE 4.55* 5.33* 3.92* 4.50* 4.41* 5.22* 3.54*  ALBUMIN 1.6* 1.5* 1.6* 1.5* <1.5* <1.5* <1.5*  CALCIUM 7.2* 7.3* 7.1* 7.3* 7.3* 7.6* 7.3*  PHOS 9.6* 10.1* 7.1* 8.3* 8.1* 8.5* 5.9*   Liver Function Tests: Recent Labs  Lab 02/23/22 0249 02/24/22 0441 02/25/22 0214  ALBUMIN <1.5* <1.5* <1.5*   No results for input(s): LIPASE, AMYLASE in the last 168 hours. No results for input(s): AMMONIA in the last 168 hours. CBC: Recent Labs  Lab 02/19/22 0047 02/22/22 1328 02/22/22 1421 02/23/22 0249 02/24/22 0441  WBC 16.7* 13.9*  --  14.8* 13.1*  HGB 8.1* 6.1* 5.8* 8.7* 8.3*  HCT 25.2* 18.8* 18.2* 25.5* 25.3*  MCV 73.7* 75.2*  --  78.5* 80.3  PLT 518* 364  --  363 321   Cardiac Enzymes: Recent Labs  Lab 02/24/22 0441  CKTOTAL 12*   CBG: Recent Labs  Lab 02/24/22 0819  02/24/22 1214 02/24/22 2107 02/25/22 0742 02/25/22 1142  GLUCAP 125* 134* 78 93 97    Iron Studies: No results for input(s): IRON, TIBC, TRANSFERRIN, FERRITIN in the last 72 hours. Studies/Results: IR Fluoro Guide CV Line Right  Result Date: 02/24/2022 INDICATION: Air leak from previously placed right IJ Trialysis catheter EXAM: CENTRAL VENOUS HEMODIALYSIS CATHETER EXCHANGE WITH FLUOROSCOPIC GUIDANCE MEDICATIONS: no antibiotics were indicated ANESTHESIA/SEDATION: None required FLUOROSCOPY: Radiation Exposure Index (as provided by the fluoroscopic device): 5 mGy Kerma COMPLICATIONS: None immediate. PROCEDURE: Informed written consent was obtained from the patient after a discussion of the risks, benefits, and alternatives to treatment. Questions regarding the procedure were encouraged and answered. The right IJ catheter and surrounding neck and chest were prepped with chlorhexidine in a sterile fashion, and a sterile drape was applied covering the operative field. Maximum barrier sterile technique with sterile gowns and gloves were used for the procedure. A timeout was performed prior to the initiation of the procedure. 1% lidocaine was administered subcutaneously around the catheter. Catheter was exchanged over an Amplatz wire for a new 16 cm Trialysis catheter, positioned with its tip in the proximal right atrium. Final catheter positioning was confirmed and documented with a spot radiographic image.  The catheter aspirates and flushes normally. The catheter was flushed with appropriate volume heparin dwells. The catheter exit site was secured with a 0-Prolene retention suture. The patient tolerated the procedure well without immediate post procedural complication. IMPRESSION: Successful exchange of 16 cm Trialysis hemodialysis catheter in the right internal jugular vein with tips terminating within the superior aspect of the right atrium. The catheter is ready for immediate use. Electronically Signed    By: Corlis Leak M.D.   On: 02/24/2022 16:07    acetaminophen  1,000 mg Oral Q6H   bisacodyl  10 mg Oral Daily   Chlorhexidine Gluconate Cloth  6 each Topical Q0600   clonazePAM  0.5 mg Oral BID   darbepoetin (ARANESP) injection - DIALYSIS  100 mcg Intravenous Q Mon-HD   docusate sodium  100 mg Oral BID   [START ON 03/28/2022] linezolid  600 mg Oral Q12H   metoprolol tartrate  12.5 mg Oral BID   mupirocin ointment   Nasal BID   nicotine  14 mg Transdermal Daily   oxyCODONE  10 mg Oral Q12H   QUEtiapine  25 mg Oral QHS   sevelamer carbonate  1,600 mg Oral TID WC   sodium chloride flush  10-40 mL Intracatheter Q12H   sodium chloride flush  3 mL Intravenous Q12H    BMET    Component Value Date/Time   NA 135 02/25/2022 0214   K 4.4 02/25/2022 0214   CL 105 02/25/2022 0214   CO2 23 02/25/2022 0214   GLUCOSE 83 02/25/2022 0214   BUN 43 (H) 02/25/2022 0214   CREATININE 3.54 (H) 02/25/2022 0214   CALCIUM 7.3 (L) 02/25/2022 0214   GFRNONAA 17 (L) 02/25/2022 0214   GFRAA >60 11/30/2015 2108   CBC    Component Value Date/Time   WBC 13.1 (H) 02/24/2022 0441   RBC 3.15 (L) 02/24/2022 0441   HGB 8.3 (L) 02/24/2022 0441   HCT 25.3 (L) 02/24/2022 0441   PLT 321 02/24/2022 0441   MCV 80.3 02/24/2022 0441   MCH 26.3 02/24/2022 0441   MCHC 32.8 02/24/2022 0441   RDW 22.2 (H) 02/24/2022 0441   LYMPHSABS 1.2 02/06/2022 2020   MONOABS 1.5 (H) 02/06/2022 2020   EOSABS 0.0 02/06/2022 2020   BASOSABS 0.1 02/06/2022 2020        Assessment/Plan: 1.  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 Would not do a biopsy as this will not change her overall management which is to treat the underlying infection (immunosuppression not indicated in such settings). -started HD given worsening kidney function, borderline UOP, persistent hyperkalemia. S/p RIJ temp line 5/29 with IR.  HD #1 on 5/30, #2 5/31, #3 6/1 (slow start protocol) -catheter dysfunction, pulling air from arterial line.  IR consulted for a new temp line placement 02/24/22 and able to run 400 bfr.   - Will continue with HD on MWF schedule for now. -no evidence of renal recovery, will need to consider converting her temp line to a TDC as her WBC improves. -monitor strict I/O 2.  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 S/p I&D of left AC septic arthritis on 5/22. On daptomycin and ceftaroline. Repeat bcx on 5/25 neg. Not a candidate for outpatient Abx 3.  Anemia: Likely secondary to acute/critical illness. Possible hemolytic anemia especially on Teflaro? Elevated LDH with negative direct  Coombs test and pending haptoglobin (still). Transfuse prn, avoid IV iron, hgb stable today. Will hold off on iron given metastatic infection, Aranesp start 6/3. Hgb improved w/ 2U PRBC 6/3, Hgb 8.7 today 4.  Anasarca/hypoalbuminemia: with significant proteinruia (nephrotic range from staph assc'd GN). Push protein. Will UF as tolerated 5.  Hyperkalemia: Secondary to acute kidney injury, resolved w/ starting HD, Low K diet 6. CKD MBD -corrected Cal WNL. PTH ordered,on renvela. Phos likely jumped up due to lack of adequate HD  Irena Cords, MD [facility]

## 2022-02-25 NOTE — Progress Notes (Signed)
Offered to clear pt trays and tidy up room, pt declined.Environmental services called to mop floor.

## 2022-02-25 NOTE — Progress Notes (Addendum)
PROGRESS NOTE    Carol Rivera  [id number] DOB: 08-14-1991 DOA: 02/06/2022 PCP: Merryl Hacker, No   Brief Narrative:  Carol Rivera is a 31 y.o. female with a history of IVDU who presented 5/18 with fever, cough, chest pain and left shoulder pain. She was found to have septic shock due to MRSA bacteremia as well as large destructive TV endocarditis with TR. IV antibiotics were continued and tailored to cultures which were persistently positive until 5/25 which have remain NGTD. CT surgery stated the location (ventricular side) of the vegetations is not accessible by angiovac and she is not a candidate for valve replacement. IV daptomycin and ceftaroline have been continued per ID recommendations. Course complicated by left AC septic joint s/p I&D 5/22, anemia of acute illness s/p 1u PRBCs 5/27, and renal failure with anasarca for which dialysis will be initiated through right IJ temporary dialysis catheter placed 02/17/2022.   Assessment & Plan:   Principal Problem:   MRSA bacteremia Active Problems:   Sepsis ([place])   Hyponatremia   Elevated LFTs   IVDU (intravenous drug user)   Left shoulder pain   Chest pain, pleuritic   Septic pulmonary embolism without acute cor pulmonale (HCC)   Endocarditis of tricuspid valve   Staphylococcal arthritis of left shoulder (HCC)   Osteomyelitis (HCC)   Normocytic anemia   Microcytic anemia   Hypoalbuminemia   AKI (acute kidney injury) ([place])   Septic arthritis of AC joint (HCC)   MRSA bacteremia with multifocal infections(multiple areas osteomyelitis/septic arthritis), septic pulmonary emboli, tricuspid valve endocarditis with TR: CT surgery eval 5/22 not accessible via angiovac, and not a candidate for valve replacement given recent IV drug abuse and valve destruction.  - TEE 5/23: large mobile TV posterior leaflet vegetation with destruction of the leaflet and "torrential tricuspid insufficiency." - No current indication for intervention per  discussion with cardiology - Blood cultures were persistently positive, though 5/25 set is NGTD.  - Current plan to continue daptomycin x 6 weeks from negative cultures - through 03/27/22 - thereafter transition to linezolid on discharge to complete 8 weeks of treatment through 04/10/22; Ceftaroline to stop 02/27/22. **UPDATE** Discussed with infectious disease/nephrology, initial plan was to transition to daptomycin in hopes that patient's renal function would recover, per nephrology this is unlikely to occur as such it may be possible to transition patient to vancomycin during dialysis which would shorten patient's hospital stay by nearly a month. Once ceftaroline completes on June 8th if patient has dialysis slot, definitive dialysis access and a safe disposition location an argument could be made to discharge patient at that time for outpatient antibiotics through dialysis.   Intractable pain secondary to recent surgery, bacteremia, multifocal infection and septic arthritis/osteomyelitis - resolving Acute metabolic encephalopthy, improving - high risk for polypharmacy - Continue to wean OxyContin to 10 mg BID and Oxy IR to 5 mg q4h PRN given increased somnolence and borderline low blood pressures over the past 48h -Patient continues to request for IV narcotics, specifically Dilaudid which we discussed was not appropriate at this time and that we would be decreasing her regimen as above given her ongoing somnolence and ongoing low blood pressure -Continue supportive seroquel and clonazepam.  - *High risk for IV drug abuse, unfortunately patient states her pain is still poorly controlled despite being quite somnolent with borderline hypotension  Anemia of acute illness vs ESRD, stabilizing Rule out acute blood loss  - Hemoglobin stabilizing - Transfusion 5/27 (x1); 6/3 (x2) - 3u total  since admission - No active bleeding. Coombs negative.   Acute renal failure: With hyperkalemia, metabolic acidosis,  declining urine output, suspect she may require HD for which nephrology has consulted IR for access. Renal U/S with increased echogenicity, no hydro. - IR placed right IJ [facility] 5/29, had 1st HD same day, 2nd 5/30 scheduled. - New temp line placed  02/24/22 due to line dysfunction (pulling air in HD) - Nephrology following - continue HD per their expertise - Monitor UOP.   Hyperkalemia, resolved:  -Managed with dialysis   Wheezing: Resolved. - Continue albuterol PRN - Likely secondary to volume overload given above   T11 osteomyelitis, POA:  - T2-hyperintensity on imaging, consistent with osteomyelitis.  - Dr. Maurice Small, neurosurgery evaluated the patient on 5/26, recommending continuation of antibiotics without acute neurosurgical intervention. - IV antibiotics ongoing as above   Left AC septic arthritis, POA:  - S/P I&D 5/22.  - Wound care per orthopedics. Sutures removed 02/23/22.   IVDU:  - Cessation counseling provided. High risk for polypharmacy. Patient will require dialysis catheter at discharge - has been educated that abusing the line would be life threatening.    Obesity:  - Estimated body mass index is 32.29 kg/m as calculated from the following: - Height as of this encounter: 5' (1.524 m). - Weight as of this encounter: 75 kg.  DVT prophylaxis: Early ambulation Code Status: Full Family Communication: None present  Status is: Inpatient  Dispo: The patient is from: Home            Anticipated d/c is to: Home            Anticipated d/c date is: Pending completion of antibiotics as above -likely require 6 weeks of IV antibiotics in total - goal 03/27/22            Patient currently not medically stable for discharge  Consultants:  ID, Orthopedic surgery, Nephrology  Antimicrobials:  Daptomycin/ceftaroline per ID  Subjective: No acute issues or events overnight patient markedly somnolent this morning, borderline hypotension, we discussed needing to wean her narcotics.  She states her pain is ongoing and uncontrolled diffusely in her back neck arms. We discussed need for ambulation as she appears to be quite sedentary which is not helping her mobility or her likely musculoskeletal pain in the setting of prolonged bedrest and limited activity.  Objective: Vitals:   02/24/22 1931 02/24/22 2318 02/25/22 0301 02/25/22 0452  BP: 102/64 95/68 110/74   Pulse: 88 81 (!) 143 85  Resp: 20 20 20  (!) 23  Temp: 97.8 F (36.6 C) 97.8 F (36.6 C) 97.8 F (36.6 C)   TempSrc: Oral Oral Oral   SpO2: 97% 94% 90% 92%  Weight:    78.5 kg  Height:        Intake/Output Summary (Last 24 hours) at 02/25/2022 0714 Last data filed at 02/24/2022 1830 Gross per 24 hour  Intake 240 ml  Output 1.1 ml  Net 238.9 ml    Filed Weights   02/24/22 0435 02/24/22 1450 02/25/22 0452  Weight: 77.1 kg 76 kg 78.5 kg    Examination:  General:  Pleasantly resting in bed, No acute distress.  Somnolent, arousable. HEENT:  Normocephalic atraumatic.  Sclerae nonicteric, noninjected.  Extraocular movements intact bilaterally. Neck: Right IJ hemodialysis catheter. Lungs:  Clear to auscultate bilaterally without rhonchi, wheeze, or rales. Heart:  Regular rate and rhythm.  Without murmurs, rubs, or gallops. Abdomen:  Soft, nontender, nondistended.  Without guarding or rebound. Extremities: Limited  range of motion diffusely secondary to generalized pain Vascular:  Dorsalis pedis and posterior tibial pulses palpable bilaterally.  Data Reviewed: I have personally reviewed following labs and imaging studies  CBC: Recent Labs  Lab 02/19/22 0047 02/22/22 1328 02/22/22 1421 02/23/22 0249 02/24/22 0441  WBC 16.7* 13.9*  --  14.8* 13.1*  HGB 8.1* 6.1* 5.8* 8.7* 8.3*  HCT 25.2* 18.8* 18.2* 25.5* 25.3*  MCV 73.7* 75.2*  --  78.5* 80.3  PLT 518* 364  --  363 [id number]    Basic Metabolic Panel: Recent Labs  Lab 02/21/22 0410 02/22/22 0307 02/23/22 0249 02/24/22 0441 02/25/22 0214  NA 136  136 136 137 135  K 4.3 4.7 4.5 4.5 4.4  CL 101 103 102 104 105  CO2 24 24 23 23 23   GLUCOSE 94 96 103* 107* 83  BUN 50* 61* 60* 66* 43*  CREATININE 3.92* 4.50* 4.41* 5.22* 3.54*  CALCIUM 7.1* 7.3* 7.3* 7.6* 7.3*  PHOS 7.1* 8.3* 8.1* 8.5* 5.9*    GFR: Estimated Creatinine Clearance: 21.3 mL/min (A) (by C-G formula based on SCr of 3.54 mg/dL (H)). Liver Function Tests: Recent Labs  Lab 02/21/22 0410 02/22/22 0307 02/23/22 0249 02/24/22 0441 02/25/22 0214  ALBUMIN 1.6* 1.5* <1.5* <1.5* <1.5*    No results for input(s): LIPASE, AMYLASE in the last 168 hours. No results for input(s): AMMONIA in the last 168 hours. Coagulation Profile: No results for input(s): INR, PROTIME in the last 168 hours. Cardiac Enzymes: Recent Labs  Lab 02/24/22 0441  CKTOTAL 12*    BNP (last 3 results) No results for input(s): PROBNP in the last 8760 hours. HbA1C: No results for input(s): HGBA1C in the last 72 hours. CBG: Recent Labs  Lab 02/23/22 1540 02/23/22 2101 02/24/22 0819 02/24/22 1214 02/24/22 2107  GLUCAP 114* 99 125* 134* 78    Lipid Profile: No results for input(s): CHOL, HDL, LDLCALC, TRIG, CHOLHDL, LDLDIRECT in the last 72 hours. Thyroid Function Tests: No results for input(s): TSH, T4TOTAL, FREET4, T3FREE, THYROIDAB in the last 72 hours. Anemia Panel: No results for input(s): VITAMINB12, FOLATE, FERRITIN, TIBC, IRON, RETICCTPCT in the last 72 hours.  Sepsis Labs: No results for input(s): PROCALCITON, LATICACIDVEN in the last 168 hours.  No results found for this or any previous visit (from the past 240 hour(s)).         Radiology Studies: IR Fluoro Guide CV Line Right  Result Date: 02/24/2022 INDICATION: Air leak from previously placed right IJ Trialysis catheter EXAM: CENTRAL VENOUS HEMODIALYSIS CATHETER EXCHANGE WITH FLUOROSCOPIC GUIDANCE MEDICATIONS: no antibiotics were indicated ANESTHESIA/SEDATION: None required FLUOROSCOPY: Radiation Exposure Index (as  provided by the fluoroscopic device): 5 mGy Kerma COMPLICATIONS: None immediate. PROCEDURE: Informed written consent was obtained from the patient after a discussion of the risks, benefits, and alternatives to treatment. Questions regarding the procedure were encouraged and answered. The right IJ catheter and surrounding neck and chest were prepped with chlorhexidine in a sterile fashion, and a sterile drape was applied covering the operative field. Maximum barrier sterile technique with sterile gowns and gloves were used for the procedure. A timeout was performed prior to the initiation of the procedure. 1% lidocaine was administered subcutaneously around the catheter. Catheter was exchanged over an Amplatz wire for a new 16 cm Trialysis catheter, positioned with its tip in the proximal right atrium. Final catheter positioning was confirmed and documented with a spot radiographic image. The catheter aspirates and flushes normally. The catheter was flushed with appropriate volume heparin dwells. The catheter  exit site was secured with a 0-Prolene retention suture. The patient tolerated the procedure well without immediate post procedural complication. IMPRESSION: Successful exchange of 16 cm Trialysis hemodialysis catheter in the right internal jugular vein with tips terminating within the superior aspect of the right atrium. The catheter is ready for immediate use. Electronically Signed   By: Lucrezia Europe M.D.   On: 02/24/2022 16:07        Scheduled Meds:  acetaminophen  1,000 mg Oral Q6H   bisacodyl  10 mg Oral Daily   Chlorhexidine Gluconate Cloth  6 each Topical Q0600   clonazePAM  0.5 mg Oral BID   darbepoetin (ARANESP) injection - DIALYSIS  100 mcg Intravenous Q Mon-HD   docusate sodium  100 mg Oral BID   [START ON 03/28/2022] linezolid  600 mg Oral Q12H   metoprolol tartrate  12.5 mg Oral BID   mupirocin ointment   Nasal BID   nicotine  14 mg Transdermal Daily   oxyCODONE  10 mg Oral Q12H    QUEtiapine  25 mg Oral QHS   sevelamer carbonate  1,600 mg Oral TID WC   sodium chloride flush  10-40 mL Intracatheter Q12H   sodium chloride flush  3 mL Intravenous Q12H   sodium polystyrene  30 g Rectal Once   Continuous Infusions:  ceFTAROline (TEFLARO) IV 200 mg (02/25/22 0559)   DAPTOmycin (CUBICIN)  IV 500 mg (02/24/22 2014)   methocarbamol (ROBAXIN) IV      LOS: 19 days   Time spent: 25min  Kamren Heskett C Shaketha Jeon, DO [facility]  If 7PM-7AM, please contact night-coverage [email or web address]  02/25/2022, 7:14 AM

## 2022-02-26 LAB — CBC
HCT: 27.6 % — ABNORMAL LOW (ref 36.0–46.0)
Hemoglobin: 9 g/dL — ABNORMAL LOW (ref 12.0–15.0)
MCH: 26 pg (ref 26.0–34.0)
MCHC: 32.6 g/dL (ref 30.0–36.0)
MCV: 79.8 fL — ABNORMAL LOW (ref 80.0–100.0)
Platelets: 318 10*3/uL (ref 150–400)
RBC: 3.46 MIL/uL — ABNORMAL LOW (ref 3.87–5.11)
RDW: 22.5 % — ABNORMAL HIGH (ref 11.5–15.5)
WBC: 14.5 10*3/uL — ABNORMAL HIGH (ref 4.0–10.5)
nRBC: 0 % (ref 0.0–0.2)

## 2022-02-26 LAB — BASIC METABOLIC PANEL
Anion gap: 11 (ref 5–15)
BUN: 48 mg/dL — ABNORMAL HIGH (ref 6–20)
CO2: 24 mmol/L (ref 22–32)
Calcium: 7.4 mg/dL — ABNORMAL LOW (ref 8.9–10.3)
Chloride: 100 mmol/L (ref 98–111)
Creatinine, Ser: 4.1 mg/dL — ABNORMAL HIGH (ref 0.44–1.00)
GFR, Estimated: 14 mL/min — ABNORMAL LOW (ref >60)
Glucose, Bld: 99 mg/dL (ref 70–99)
Potassium: 4.1 mmol/L (ref 3.5–5.1)
Sodium: 135 mmol/L (ref 135–145)

## 2022-02-26 LAB — GLUCOSE, CAPILLARY
Glucose-Capillary: 102 mg/dL — ABNORMAL HIGH (ref 70–99)
Glucose-Capillary: 99 mg/dL (ref 70–99)
Glucose-Capillary: 109 mg/dL — ABNORMAL HIGH (ref 70–99)

## 2022-02-26 MED ORDER — ALBUMIN HUMAN 25 % IV SOLN
25.0000 g | Freq: Two times a day (BID) | INTRAVENOUS | Status: DC | PRN
  Administered 2022-02-26: 25 g via INTRAVENOUS
  Filled 2022-02-26: qty 100

## 2022-02-26 NOTE — Progress Notes (Addendum)
PROGRESS NOTE        PATIENT DETAILS Name: Carol Rivera Age: 31 y.o. Sex: female Date of Birth: [birth year] Admit Date: 02/06/2022 Admitting Physician Briscoe Deutscher, MD PCP:Pcp, No  Brief Summary: 31 year old with history of IVDA (heroin)-who presented with acute metabolic encephalopathy, septic shock-was found to have MRSA bacteremia with tricuspid valve endocarditis along with septic left shoulder AC joint and T11 osteomyelitis.  She was evaluated by CTS-and not felt to be a candidate for valve replacement surgery.  Further hospital course was complicated by development of glomerulonephritis causing AKI requiring hemodialysis.  See below for further details.  Significant events 5/18>> admitted to hospital for MRSA endocarditis/bacteremia 5/22>> L shoulder washout-left intubated-to ICU-extubated in the ICU 5/24>> transfer to [facility]  Significant studies: 5/19>> trace perihepatic ascites, contracted gallbladder-no evidence of biliary duct obstruction 5/19>> CTA chest: Probable septic emboli, splenomegaly-no PE. 5/19>> MRI C-spine/T-spine/L-spine: No area of discitis/osteomyelitis/epidural abscess 5/19>> MRI left shoulder: Septic arthritis involving acromioclavicular joint. 5/25>> MRI cervical/thoracic spine: T11 osteomyelitis. 5/27>> renal ultrasound: No hydronephrosis-echogenic kidneys bilaterally  Significant microbiology data: 5/18>> COVID/influenza PCR: Negative 5/18>> blood culture: MRSA 5/19>> blood culture: MRSA 5/19>> urine culture: MRSA 5/20>> blood culture: MRSA 5/22>> blood culture: MRSA 5/22>> synovial fluid left AC joint: MRSA 5/25>> blood culture: Negative  Procedures: 5/22>> left shoulder acromioclavicular joint resection and debridement. 5/23>> TEE: EF 65-70%, large mobile multilobed vegetation posterior leaflet of tricuspid valve.  Torrential tricuspid valve regurgitation. 5/29>> temporary HD catheter placement by IR. 6/05>> HD  catheter exchange (due to malfunction)  Consults: Cardiology, PCCM, orthopedics, cardiothoracic surgery, ID, neurosurgery, nephrology   Subjective: Lying comfortably in bed-denies any chest pain or shortness of breath.  Objective: Vitals: Blood pressure (!) 99/59, pulse 88, temperature 98 F (36.7 C), temperature source Oral, resp. rate 17, height 5' (1.524 m), weight 74 kg, SpO2 93 %.   Exam: Gen Exam:Alert awake-not in any distress HEENT:atraumatic, normocephalic Chest: B/L clear to auscultation anteriorly CVS:S1S2 regular Abdomen:soft non tender, non distended Extremities:no edema Neurology: Non focal Skin: no rash  Pertinent Labs/Radiology:    Latest Ref Rng & Units 02/26/2022    5:00 AM 02/24/2022    4:41 AM 02/23/2022    2:49 AM  CBC  WBC 4.0 - 10.5 K/uL 14.5   13.1   14.8    Hemoglobin 12.0 - 15.0 g/dL 9.0   8.3   8.7    Hematocrit 36.0 - 46.0 % 27.6   25.3   25.5    Platelets 150 - 400 K/uL 318   321   363      Lab Results  Component Value Date   NA 135 02/26/2022   K 4.1 02/26/2022   CL 100 02/26/2022   CO2 24 02/26/2022      Assessment/Plan: Septic shock due to MRSA native tricuspid valve endocarditis with  bilateral pulmonary septic emboli, T11 osteomyelitis and left shoulder acromioclavicular joint septic arthritis-s/p debridement on 5/22: Sepsis physiology has resolved-per CT surgery-not a candidate for valve replacement surgery.  Work-up as above-recommendations are to continue daptomycin through 7/6-thereafter linezolid through 7/20 to complete 8 weeks of treatment.  Teflaro stop date 6/8.  Acute metabolic encephalopathy: Likely due to sepsis-resolved.  Continue Seroquel.  Watch closely-at risk for polypharmacy is on narcotics/benzos.  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  No signs of renal recovery  yet.  Normocytic anemia: Due to renal failure and acute illness.  Watch closely and transfuse if  <7.  Has required total of 3 units so far.  Defer Aranesp/iron to nephrology service.  Anasarca: Due to hypoalbuminemia-continue supportive care.  Chronic HCV infection: Further treatment deferred to the outpatient setting when patient follows with ID.  Heroin/IVDA: Continue to counsel-she will likely be discharged home at the HD catheter-she has been counseled extensively that abusing the line with be life-threatening.  Currently on oxycodone-we will discuss options regarding Suboxone/methadone with patient over the next few days.  Obesity: Estimated body mass index is 31.86 kg/m as calculated from the following:   Height as of this encounter: 5' (1.524 m).   Weight as of this encounter: 74 kg.   Code status:   Code Status: Full Code   DVT Prophylaxis: SCDs Start: 02/10/22 1422 Place TED hose Start: 02/10/22 1422 Place and maintain sequential compression device Start: 02/09/22 1132   Family Communication: None at bedside   Disposition Plan: Status is: Inpatient Remains inpatient appropriate because: DVT endocarditis with MRSA-on IV antibiotics-AKI with no signs of renal recovery-on HD.  Not yet stable for discharge.   Planned Discharge Destination:Home   Diet: Diet Order             Diet renal with fluid restriction Fluid restriction: 1200 mL Fluid; Room service appropriate? No; Fluid consistency: Thin  Diet effective now                     Antimicrobial agents: Anti-infectives (From admission, onward)    Start     Dose/Rate Route Frequency Ordered Stop   03/28/22 1000  linezolid (ZYVOX) tablet 600 mg        600 mg Oral Every 12 hours 02/19/22 1417 04/11/22 0959   02/21/22 0930  fluconazole (DIFLUCAN) IVPB 400 mg        400 mg 100 mL/hr over 120 Minutes Intravenous  Once 02/21/22 0839 02/21/22 1241   02/18/22 1945  DAPTOmycin (CUBICIN) 500 mg in sodium chloride 0.9 % IVPB        500 mg 120 mL/hr over 30 Minutes Intravenous Every 48 hours 02/18/22 0808  03/27/22 2359   02/18/22 1400  ceftaroline (TEFLARO) 200 mg in sodium chloride 0.9 % 100 mL IVPB        200 mg 110 mL/hr over 60 Minutes Intravenous Every 8 hours 02/18/22 1031 02/27/22 2359   02/16/22 1934  DAPTOmycin (CUBICIN) 600 mg in sodium chloride 0.9 % IVPB  Status:  Discontinued        8 mg/kg  74.8 kg 124 mL/hr over 30 Minutes Intravenous Every 48 hours 02/15/22 1216 02/18/22 0808   02/15/22 1400  ceftaroline (TEFLARO) 300 mg in sodium chloride 0.9 % 100 mL IVPB  Status:  Discontinued        300 mg 115 mL/hr over 60 Minutes Intravenous Every 8 hours 02/15/22 1216 02/18/22 1031   02/14/22 1400  ceftaroline (TEFLARO) 400 mg in sodium chloride 0.9 % 100 mL IVPB  Status:  Discontinued        400 mg 100 mL/hr over 60 Minutes Intravenous Every 8 hours 02/14/22 0726 02/15/22 1216   02/10/22 1152  vancomycin (VANCOCIN) powder  Status:  Discontinued          As needed 02/10/22 1153 02/10/22 1242   02/09/22 1400  ceftaroline (TEFLARO) 600 mg in sodium chloride 0.9 % 100 mL IVPB  Status:  Discontinued  600 mg 100 mL/hr over 60 Minutes Intravenous Every 8 hours 02/09/22 0955 02/14/22 0726   02/09/22 1200  DAPTOmycin (CUBICIN) 500 mg in sodium chloride 0.9 % IVPB  Status:  Discontinued        8 mg/kg  61.2 kg 120 mL/hr over 30 Minutes Intravenous Daily 02/09/22 0955 02/15/22 1216   02/07/22 2200  Vancomycin (VANCOCIN) 1,250 mg in sodium chloride 0.9 % 250 mL IVPB  Status:  Discontinued        1,250 mg 166.7 mL/hr over 90 Minutes Intravenous Every 24 hours 02/07/22 0434 02/07/22 1645   02/07/22 2200  vancomycin (VANCOREADY) IVPB 1250 mg/250 mL  Status:  Discontinued        1,250 mg 166.7 mL/hr over 90 Minutes Intravenous Every 24 hours 02/07/22 1645 02/09/22 0955   02/07/22 1000  metroNIDAZOLE (FLAGYL) IVPB 500 mg  Status:  Discontinued        500 mg 100 mL/hr over 60 Minutes Intravenous Every 12 hours 02/07/22 0427 02/07/22 1231   02/07/22 0600  ceFEPIme (MAXIPIME) 2 g in sodium  chloride 0.9 % 100 mL IVPB  Status:  Discontinued        2 g 200 mL/hr over 30 Minutes Intravenous Every 8 hours 02/07/22 0434 02/07/22 1231   02/06/22 2130  metroNIDAZOLE (FLAGYL) IVPB 500 mg        500 mg 100 mL/hr over 60 Minutes Intravenous  Once 02/06/22 2121 02/06/22 2254   02/06/22 2130  ceFEPIme (MAXIPIME) 2 g in sodium chloride 0.9 % 100 mL IVPB        2 g 200 mL/hr over 30 Minutes Intravenous  Once 02/06/22 2126 02/06/22 2223   02/06/22 2130  vancomycin (VANCOCIN) IVPB 1000 mg/200 mL premix        1,000 mg 200 mL/hr over 60 Minutes Intravenous  Once 02/06/22 2126 02/06/22 2254        MEDICATIONS: Scheduled Meds:  acetaminophen  1,000 mg Oral Q6H   bisacodyl  10 mg Oral Daily   Chlorhexidine Gluconate Cloth  6 each Topical Q0600   clonazePAM  0.5 mg Oral BID   darbepoetin (ARANESP) injection - DIALYSIS  100 mcg Intravenous Q Mon-HD   docusate sodium  100 mg Oral BID   [START ON 03/28/2022] linezolid  600 mg Oral Q12H   metoprolol tartrate  12.5 mg Oral BID   mupirocin ointment   Nasal BID   nicotine  14 mg Transdermal Daily   oxyCODONE  10 mg Oral Q12H   QUEtiapine  25 mg Oral QHS   sevelamer carbonate  1,600 mg Oral TID WC   sodium chloride flush  10-40 mL Intracatheter Q12H   sodium chloride flush  3 mL Intravenous Q12H   Continuous Infusions:  albumin human 25 g (02/26/22 1013)   ceFTAROline (TEFLARO) IV 200 mg (02/26/22 0517)   DAPTOmycin (CUBICIN)  IV 500 mg (02/24/22 2014)   methocarbamol (ROBAXIN) IV     PRN Meds:.albumin human, albuterol, ALPRAZolam, alum & mag hydroxide-simeth, guaiFENesin, methocarbamol **OR** methocarbamol (ROBAXIN) IV, metoCLOPramide **OR** metoCLOPramide (REGLAN) injection, ondansetron (ZOFRAN) IV, ondansetron **OR** ondansetron (ZOFRAN) IV, oxyCODONE, phenol, sodium chloride flush   I have personally reviewed following labs and imaging studies  LABORATORY DATA: CBC: Recent Labs  Lab 02/22/22 1328 02/22/22 1421 02/23/22 0249  02/24/22 0441 02/26/22 0500  WBC 13.9*  --  14.8* 13.1* 14.5*  HGB 6.1* 5.8* 8.7* 8.3* 9.0*  HCT 18.8* 18.2* 25.5* 25.3* 27.6*  MCV 75.2*  --  78.5* 80.3 79.8*  PLT 364  --  363 321 318    Basic Metabolic Panel: Recent Labs  Lab 02/21/22 0410 02/22/22 0307 02/23/22 0249 02/24/22 0441 02/25/22 0214 02/26/22 0500  NA 136 136 136 137 135 135  K 4.3 4.7 4.5 4.5 4.4 4.1  CL 101 103 102 104 105 100  CO2 GLUCOSE 94 96 103* 107* 83 99  BUN 50* 61* 60* 66* 43* 48*  CREATININE 3.92* 4.50* 4.41* 5.22* 3.54* 4.10*  CALCIUM 7.1* 7.3* 7.3* 7.6* 7.3* 7.4*  PHOS 7.1* 8.3* 8.1* 8.5* 5.9*  --     GFR: Estimated Creatinine Clearance: 17.9 mL/min (A) (by C-G formula based on SCr of 4.1 mg/dL (H)).  Liver Function Tests: Recent Labs  Lab 02/21/22 0410 02/22/22 0307 02/23/22 0249 02/24/22 0441 02/25/22 0214  ALBUMIN 1.6* 1.5* <1.5* <1.5* <1.5*   No results for input(s): LIPASE, AMYLASE in the last 168 hours. No results for input(s): AMMONIA in the last 168 hours.  Coagulation Profile: No results for input(s): INR, PROTIME in the last 168 hours.  Cardiac Enzymes: Recent Labs  Lab 02/24/22 0441  CKTOTAL 12*    BNP (last 3 results) No results for input(s): PROBNP in the last 8760 hours.  Lipid Profile: No results for input(s): CHOL, HDL, LDLCALC, TRIG, CHOLHDL, LDLDIRECT in the last 72 hours.  Thyroid Function Tests: No results for input(s): TSH, T4TOTAL, FREET4, T3FREE, THYROIDAB in the last 72 hours.  Anemia Panel: No results for input(s): VITAMINB12, FOLATE, FERRITIN, TIBC, IRON, RETICCTPCT in the last 72 hours.  Urine analysis:    Component Value Date/Time   COLORURINE AMBER (A) 02/14/2022 1751   APPEARANCEUR CLOUDY (A) 02/14/2022 1751   LABSPEC 1.014 02/14/2022 1751   PHURINE 5.0 02/14/2022 1751   GLUCOSEU NEGATIVE 02/14/2022 1751   HGBUR LARGE (A) 02/14/2022 1751   BILIRUBINUR NEGATIVE 02/14/2022 1751   KETONESUR NEGATIVE 02/14/2022 1751    PROTEINUR 100 (A) 02/14/2022 1751   UROBILINOGEN 0.2 02/24/2012 2301   NITRITE NEGATIVE 02/14/2022 1751   LEUKOCYTESUR SMALL (A) 02/14/2022 1751    Sepsis Labs: Lactic Acid, Venous    Component Value Date/Time   LATICACIDVEN 1.6 02/09/2022 1655    MICROBIOLOGY: No results found for this or any previous visit (from the past 240 hour(s)).  RADIOLOGY STUDIES/RESULTS: No results found.   LOS: 20 days   Jeoffrey Massed, MD  [facility]    To contact the attending provider between 7A-7P or the covering provider during after hours 7P-7A, please log into the web site [email or web address] and access using universal [place] password for that web site. If you do not have the password, please call the hospital operator.  02/26/2022, 1:43 PM

## 2022-02-26 NOTE — Progress Notes (Signed)
IV team RN has removed HD catheter. Pt waiting the 30 minutes before leaving. Pt's mother has gathered all of pt's belongings and taken them to the car. Pt's mother stated she is taking pt to [facility] hospital to have pt since there.

## 2022-02-26 NOTE — Progress Notes (Signed)
Patient ID: Carol Rivera, female   DOB: 11-22-1990, 31 y.o.   MRN: [id number] S: Unable to complete HD today due to catheter malfunction.  It started to pull air in the lumen which is why it was exchanged on 02/24/22.   O:BP (!) 99/59 (BP Location: Left Arm)   Pulse 88   Temp 98 F (36.7 C) (Oral)   Resp 17   Ht 5' (1.524 m)   Wt 74 kg   SpO2 93%   BMI 31.86 kg/m   Intake/Output Summary (Last 24 hours) at 02/26/2022 1232 Last data filed at 02/26/2022 1043 Gross per 24 hour  Intake 200 ml  Output -1.6 ml  Net 201.6 ml   Intake/Output: I/O last 3 completed shifts: In: 200 [IV Piggyback:200] Out: -   Intake/Output this shift:  Total I/O In: -  Out: -1.6  Weight change: 4.4 kg Gen:NAD CVS: RRR Resp:CTA Abd: +BS, soft, NT/Nd Ext: trace pedal edema  Recent Labs  Lab 02/20/22 0440 02/21/22 0410 02/22/22 0307 02/23/22 0249 02/24/22 0441 02/25/22 0214 02/26/22 0500  NA 136 136 136 136 137 135 135  K 5.3* 4.3 4.7 4.5 4.5 4.4 4.1  CL 104 101 103 102 104 105 100  CO2 21* 24 24 23 23 23 24   GLUCOSE 91 94 96 103* 107* 83 99  BUN 81* 50* 61* 60* 66* 43* 48*  CREATININE 5.33* 3.92* 4.50* 4.41* 5.22* 3.54* 4.10*  ALBUMIN 1.5* 1.6* 1.5* <1.5* <1.5* <1.5*  --   CALCIUM 7.3* 7.1* 7.3* 7.3* 7.6* 7.3* 7.4*  PHOS 10.1* 7.1* 8.3* 8.1* 8.5* 5.9*  --    Liver Function Tests: Recent Labs  Lab 02/23/22 0249 02/24/22 0441 02/25/22 0214  ALBUMIN <1.5* <1.5* <1.5*   No results for input(s): LIPASE, AMYLASE in the last 168 hours. No results for input(s): AMMONIA in the last 168 hours. CBC: Recent Labs  Lab 02/22/22 1328 02/22/22 1421 02/23/22 0249 02/24/22 0441 02/26/22 0500  WBC 13.9*  --  14.8* 13.1* 14.5*  HGB 6.1*   < > 8.7* 8.3* 9.0*  HCT 18.8*   < > 25.5* 25.3* 27.6*  MCV 75.2*  --  78.5* 80.3 79.8*  PLT 364  --  363 321 318   < > = values in this interval not displayed.   Cardiac Enzymes: Recent Labs  Lab 02/24/22 0441  CKTOTAL 12*   CBG: Recent Labs  Lab  02/25/22 1142 02/25/22 1612 02/25/22 2112 02/26/22 0818 02/26/22 1144  GLUCAP 97 90 111* 102* 99    Iron Studies: No results for input(s): IRON, TIBC, TRANSFERRIN, FERRITIN in the last 72 hours. Studies/Results: IR Fluoro Guide CV Line Right  Result Date: 02/24/2022 INDICATION: Air leak from previously placed right IJ Trialysis catheter EXAM: CENTRAL VENOUS HEMODIALYSIS CATHETER EXCHANGE WITH FLUOROSCOPIC GUIDANCE MEDICATIONS: no antibiotics were indicated ANESTHESIA/SEDATION: None required FLUOROSCOPY: Radiation Exposure Index (as provided by the fluoroscopic device): 5 mGy Kerma COMPLICATIONS: None immediate. PROCEDURE: Informed written consent was obtained from the patient after a discussion of the risks, benefits, and alternatives to treatment. Questions regarding the procedure were encouraged and answered. The right IJ catheter and surrounding neck and chest were prepped with chlorhexidine in a sterile fashion, and a sterile drape was applied covering the operative field. Maximum barrier sterile technique with sterile gowns and gloves were used for the procedure. A timeout was performed prior to the initiation of the procedure. 1% lidocaine was administered subcutaneously around the catheter. Catheter was exchanged over an Amplatz wire for a  new 16 cm Trialysis catheter, positioned with its tip in the proximal right atrium. Final catheter positioning was confirmed and documented with a spot radiographic image. The catheter aspirates and flushes normally. The catheter was flushed with appropriate volume heparin dwells. The catheter exit site was secured with a 0-Prolene retention suture. The patient tolerated the procedure well without immediate post procedural complication. IMPRESSION: Successful exchange of 16 cm Trialysis hemodialysis catheter in the right internal jugular vein with tips terminating within the superior aspect of the right atrium. The catheter is ready for immediate use.  Electronically Signed   By: Corlis Leak M.D.   On: 02/24/2022 16:07    acetaminophen  1,000 mg Oral Q6H   bisacodyl  10 mg Oral Daily   Chlorhexidine Gluconate Cloth  6 each Topical Q0600   clonazePAM  0.5 mg Oral BID   darbepoetin (ARANESP) injection - DIALYSIS  100 mcg Intravenous Q Mon-HD   docusate sodium  100 mg Oral BID   [START ON 03/28/2022] linezolid  600 mg Oral Q12H   metoprolol tartrate  12.5 mg Oral BID   mupirocin ointment   Nasal BID   nicotine  14 mg Transdermal Daily   oxyCODONE  10 mg Oral Q12H   QUEtiapine  25 mg Oral QHS   sevelamer carbonate  1,600 mg Oral TID WC   sodium chloride flush  10-40 mL Intracatheter Q12H   sodium chloride flush  3 mL Intravenous Q12H    BMET    Component Value Date/Time   NA 135 02/26/2022 0500   K 4.1 02/26/2022 0500   CL 100 02/26/2022 0500   CO2 24 02/26/2022 0500   GLUCOSE 99 02/26/2022 0500   BUN 48 (H) 02/26/2022 0500   CREATININE 4.10 (H) 02/26/2022 0500   CALCIUM 7.4 (L) 02/26/2022 0500   GFRNONAA 14 (L) 02/26/2022 0500   GFRAA >60 11/30/2015 2108   CBC    Component Value Date/Time   WBC 14.5 (H) 02/26/2022 0500   RBC 3.46 (L) 02/26/2022 0500   HGB 9.0 (L) 02/26/2022 0500   HCT 27.6 (L) 02/26/2022 0500   PLT 318 02/26/2022 0500   MCV 79.8 (L) 02/26/2022 0500   MCH 26.0 02/26/2022 0500   MCHC 32.6 02/26/2022 0500   RDW 22.5 (H) 02/26/2022 0500   LYMPHSABS 1.2 02/06/2022 2020   MONOABS 1.5 (H) 02/06/2022 2020   EOSABS 0.0 02/06/2022 2020   BASOSABS 0.1 02/06/2022 2020    Assessment/Plan: 1.  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 Would not do a biopsy as this will not change her overall management which is to treat the underlying infection (immunosuppression not indicated in such settings). -started HD given worsening kidney function, borderline UOP, persistent hyperkalemia. S/p RIJ  temp line 5/29 with IR. HD #1 on 5/30, #2 5/31, #3 6/1 (slow start protocol) -catheter dysfunction, pulling air from arterial line.  IR consulted for a new temp line placement 02/24/22 and able to run 400 bfr but unable to use today for same reason.  Will re-consult IR to place [facility] now that her blood cultures have been negative for more than 5 days. - Will continue with HD on MWF schedule for now. -no evidence of renal recovery -monitor strict I/O 2.  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 S/p I&D of left AC septic arthritis on 5/22.  On daptomycin and ceftaroline. Repeat bcx on 5/25 neg. Not a candidate for outpatient Abx 3.  Anemia: Likely secondary to acute/critical illness. Possible hemolytic anemia especially on Teflaro? Elevated LDH with negative direct Coombs test and pending haptoglobin (still). Transfuse prn, avoid IV iron, hgb stable today. Will hold off on iron given metastatic infection, Aranesp start 6/3. Hgb improved w/ 2U PRBC 6/3, Hgb 8.7 today 4.  Anasarca/hypoalbuminemia: with significant proteinruia (nephrotic range from staph assc'd GN). Push protein. Will UF as tolerated 5.  Hyperkalemia: Secondary to acute kidney injury, resolved w/ starting HD, Low K diet 6. CKD MBD -corrected Cal WNL. PTH ordered,on renvela. Phos likely jumped up due to lack of adequate HD  Carol CordsJoseph A. Kariya Lavergne, MD [facility]

## 2022-02-26 NOTE — Progress Notes (Signed)
Out Patient Arrangements:  Have been requested to for possible out pt HD for pt. Please advise if services will be needed.    Andree Elk [facility] [phone]

## 2022-02-26 NOTE — Progress Notes (Incomplete)
Dapto >> Vanc

## 2022-02-26 NOTE — Progress Notes (Signed)
Pt taken to HD.  

## 2022-02-26 NOTE — Progress Notes (Signed)
New orders received to discontinue dialysis treatment related to the atrial line pulling air into the catheter.  Her blood pressure was low at the beginning of the treatment.  UF was stopped.   New order received to give albumin 25mg  which was given through the venous line. Resident is listless, but able to be aroused and was able to eat a graham cracker and a few sips of diet soda, clear.

## 2022-02-26 NOTE — Progress Notes (Signed)
Pt has decided to leave AMA. Pt is upset that her significant other has strict guidelines with his visitation. This nurse explained to pt in great depth the risks of leaving AMA and that it can possibly be life threatening. Pt stated she understood all the risks and still wants to leave AMA. Dr. Jerral Ralph made aware and stated to explain the risks of leaving to the pt again. This nurse explained the risks again to pt and pt's mother, in hope to receive support from pt's mother in getting pt to stay but pt stated she was still leaving. Pt signed AMA form. IV team consulted to have RIJ HD catheter removed. Informed pt that she could not leave until that catheter was removed. Pt verbalized understanding.

## 2022-02-27 NOTE — Discharge Summary (Signed)
PATIENT DETAILS Name: Carol Rivera Age: 31 y.o. Sex: female Date of Birth: 01-02-1991 MRN: [id number]. Admitting Physician: Briscoe Deutscher, MD PCP:Pcp, No  Admit Date: 02/06/2022 Discharge date: 02/26/2022  PATIENT LEFT AMA ON 6/7   Recommendations for Outpatient Follow-up:  LEFT AMA-Needs to complete IV Abx therapy through 7/6  Admitted From:  Home  Disposition: AMA   Discharge Condition: fair  CODE STATUS:   Code Status: Prior   Diet recommendation:  Diet Order     None        Brief Summary: 31 year old with history of IVDA (heroin)-who presented with acute metabolic encephalopathy, septic shock-was found to have MRSA bacteremia with tricuspid valve endocarditis along with septic left shoulder AC joint and T11 osteomyelitis.  She was evaluated by CTS-and not felt to be a candidate for valve replacement surgery.  Further hospital course was complicated by development of glomerulonephritis causing AKI requiring hemodialysis.  See below for further details.   Significant events 5/18>> admitted to hospital for MRSA endocarditis/bacteremia 5/22>> L shoulder washout-left intubated-to ICU-extubated in the ICU 5/24>> transfer to [facility]   Significant studies: 5/19>> trace perihepatic ascites, contracted gallbladder-no evidence of biliary duct obstruction 5/19>> CTA chest: Probable septic emboli, splenomegaly-no PE. 5/19>> MRI C-spine/T-spine/L-spine: No area of discitis/osteomyelitis/epidural abscess 5/19>> MRI left shoulder: Septic arthritis involving acromioclavicular joint. 5/25>> MRI cervical/thoracic spine: T11 osteomyelitis. 5/27>> renal ultrasound: No hydronephrosis-echogenic kidneys bilaterally 6/07>>Left AMA-(frustrated due to visitation restriction on significant other)   Significant microbiology data: 5/18>> COVID/influenza PCR: Negative 5/18>> blood culture: MRSA 5/19>> blood culture: MRSA 5/19>> urine culture: MRSA 5/20>> blood culture: MRSA 5/22>>  blood culture: MRSA 5/22>> synovial fluid left AC joint: MRSA 5/25>> blood culture: Negative   Procedures: 5/22>> left shoulder acromioclavicular joint resection and debridement. 5/23>> TEE: EF 65-70%, large mobile multilobed vegetation posterior leaflet of tricuspid valve.  Torrential tricuspid valve regurgitation. 5/29>> temporary HD catheter placement by IR. 6/05>> HD catheter exchange (due to malfunction)   Consults: Cardiology, PCCM, orthopedics, cardiothoracic surgery, ID, neurosurgery, nephrology    Brief Hospital Course: Septic shock due to MRSA native tricuspid valve endocarditis with  bilateral pulmonary septic emboli, T11 osteomyelitis and left shoulder acromioclavicular joint septic arthritis-s/p debridement on 5/22: Sepsis physiology has resolved-per CT surgery-not a candidate for valve replacement surgery.  Work-up as above-recommendations are to continue daptomycin through 7/6-thereafter linezolid through 7/20 to complete 8 weeks of treatment.  Teflaro stop date 6/8.Unfortunately left AMA before IV Abx therapy could be completed   Acute metabolic encephalopathy: Likely due to sepsis-resolved.    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 Unfortunately patient left AMA before arrangments for outpatient HD could be completed   Normocytic anemia: Due to renal failure and acute illness.  Required total of 3 units so far.     Anasarca: Due to hypoalbuminemia   Chronic HCV infection: Further treatment deferred to the outpatient setting when patient follows with ID.   Heroin/IVDA: Counseled extensively-continued to ask for escalation of narcotic dosage throughout this hospitalizationn   Obesity: Estimated body mass index is 31.86 kg/m as calculated from the following:   Height as of this encounter: 5' (1.524 m).   Weight as of  this encounter: 74 kg.   NOTE LEFT AMA ON 6/7-SHE WAS MADE AWARE OF THE LIFE THREATENING AND LIFE DISABLING RISKS OF LEAVING BEFORE BEING MEDICALLY STABLE BY THE RN.PATIENT WAS INCREASINGLY FRUSTRATED BY  VISITATION RESTRICTIONS ON SIGNIFICANT OTHER.  Discharge Diagnoses:  Principal Problem:   MRSA bacteremia Active Problems:   Sepsis (HCC)   Hyponatremia   Elevated LFTs   IVDU (intravenous drug user)   Left shoulder pain   Chest pain, pleuritic   Septic pulmonary embolism without acute cor pulmonale (HCC)   Endocarditis of tricuspid valve   Staphylococcal arthritis of left shoulder (HCC)   Osteomyelitis (HCC)   Normocytic anemia   Microcytic anemia   Hypoalbuminemia   AKI (acute kidney injury) (HCC)   Septic arthritis of AC joint (HCC)   Discharge Instructions:  Activity:  As tolerated    Allergies as of 02/26/2022       Reactions   Doxycycline Other (See Comments)   REACTION: Joint Pain        Medication List     ASK your doctor about these medications    multivitamin with minerals Tabs tablet Take 1 tablet by mouth daily.   naproxen sodium 220 MG tablet Commonly known as: ALEVE Take 220 mg by mouth 2 (two) times daily as needed (pain/headache).        Allergies  Allergen Reactions   Doxycycline Other (See Comments)    REACTION: Joint Pain     Other Procedures/Studies: IR Fluoro Guide CV Line Right  Result Date: 02/24/2022 INDICATION: Air leak from previously placed right IJ Trialysis catheter EXAM: CENTRAL VENOUS HEMODIALYSIS CATHETER EXCHANGE WITH FLUOROSCOPIC GUIDANCE MEDICATIONS: no antibiotics were indicated ANESTHESIA/SEDATION: None required FLUOROSCOPY: Radiation Exposure Index (as provided by the fluoroscopic device): 5 mGy Kerma COMPLICATIONS: None immediate. PROCEDURE: Informed written consent was obtained from the patient after a discussion of the risks, benefits, and alternatives to treatment. Questions regarding the procedure were  encouraged and answered. The right IJ catheter and surrounding neck and chest were prepped with chlorhexidine in a sterile fashion, and a sterile drape was applied covering the operative field. Maximum barrier sterile technique with sterile gowns and gloves were used for the procedure. A timeout was performed prior to the initiation of the procedure. 1% lidocaine was administered subcutaneously around the catheter. Catheter was exchanged over an Amplatz wire for a new 16 cm Trialysis catheter, positioned with its tip in the proximal right atrium. Final catheter positioning was confirmed and documented with a spot radiographic image. The catheter aspirates and flushes normally. The catheter was flushed with appropriate volume heparin dwells. The catheter exit site was secured with a 0-Prolene retention suture. The patient tolerated the procedure well without immediate post procedural complication. IMPRESSION: Successful exchange of 16 cm Trialysis hemodialysis catheter in the right internal jugular vein with tips terminating within the superior aspect of the right atrium. The catheter is ready for immediate use. Electronically Signed   By: Corlis Leak M.D.   On: 02/24/2022 16:07   IR Fluoro Guide CV Line Right  Result Date: 02/17/2022 INDICATION: Need for dialysis access EXAM: 1. Ultrasound-guided puncture of the right internal jugular vein 2. Placement of a temporary hemodialysis catheter using fluoroscopic guidance MEDICATIONS: None ANESTHESIA/SEDATION: Local analgesia FLUOROSCOPY TIME:  Fluoroscopy Time: 0.2 minutes (1 mGy) COMPLICATIONS: None immediate. PROCEDURE: Informed written consent was obtained from the patient after a thorough discussion of the procedural risks, benefits and alternatives. All questions were addressed. Maximal Sterile Barrier Technique was utilized including caps, mask, sterile gowns, sterile gloves, sterile drape, hand hygiene and skin antiseptic. A timeout was performed prior to the  initiation of the procedure. The patient was placed supine on the exam table. The right neck  and chest was prepped and draped in the standard sterile fashion. Ultrasound was used to evaluate the right internal jugular vein, which was found to be widely patent. A permanent image was stored in the electronic medical record. Using ultrasound guidance, the right internal jugular vein was directly punctured using a 21 gauge micropuncture set. Access site was serially dilated to accommodate an 035 wire, which was advanced into the IVC. Subsequently, serial tract dilation was performed and a 16 cm temporary triple-lumen hemodialysis catheter was advanced into the central veins, such that the tip overlies the superior cavoatrial junction. The catheter was found to aspirate and flush appropriately. It was locked with the appropriate volume of heparinized saline. It was secured to the skin using silk suture and a sterile dressing. The patient tolerated the procedure well without immediate complication. IMPRESSION: Successful placement of a temporary triple-lumen hemodialysis catheter via the right internal jugular vein using ultrasound and fluoroscopic guidance. The line is ready for immediate use. Electronically Signed   By: Olive Bass M.D.   On: 02/17/2022 09:28   IR US Guide Vasc Access Right  Result Date: 02/17/2022 INDICATION: Need for dialysis access EXAM: 1. Ultrasound-guided puncture of the right internal jugular vein 2. Placement of a temporary hemodialysis catheter using fluoroscopic guidance MEDICATIONS: None ANESTHESIA/SEDATION: Local analgesia FLUOROSCOPY TIME:  Fluoroscopy Time: 0.2 minutes (1 mGy) COMPLICATIONS: None immediate. PROCEDURE: Informed written consent was obtained from the patient after a thorough discussion of the procedural risks, benefits and alternatives. All questions were addressed. Maximal Sterile Barrier Technique was utilized including caps, mask, sterile gowns, sterile gloves,  sterile drape, hand hygiene and skin antiseptic. A timeout was performed prior to the initiation of the procedure. The patient was placed supine on the exam table. The right neck and chest was prepped and draped in the standard sterile fashion. Ultrasound was used to evaluate the right internal jugular vein, which was found to be widely patent. A permanent image was stored in the electronic medical record. Using ultrasound guidance, the right internal jugular vein was directly punctured using a 21 gauge micropuncture set. Access site was serially dilated to accommodate an 035 wire, which was advanced into the IVC. Subsequently, serial tract dilation was performed and a 16 cm temporary triple-lumen hemodialysis catheter was advanced into the central veins, such that the tip overlies the superior cavoatrial junction. The catheter was found to aspirate and flush appropriately. It was locked with the appropriate volume of heparinized saline. It was secured to the skin using silk suture and a sterile dressing. The patient tolerated the procedure well without immediate complication. IMPRESSION: Successful placement of a temporary triple-lumen hemodialysis catheter via the right internal jugular vein using ultrasound and fluoroscopic guidance. The line is ready for immediate use. Electronically Signed   By: Olive Bass M.D.   On: 02/17/2022 09:28   US RENAL  Result Date: 02/15/2022 CLINICAL DATA:  Acute kidney injury. EXAM: RENAL / URINARY TRACT ULTRASOUND COMPLETE COMPARISON:  None Available. FINDINGS: Right Kidney: Renal measurements: 13.5 x 4.4 x 4.8 cm = volume: 148 mL. Increased echogenicity without hydronephrosis or mass lesion evident. Left Kidney: Renal measurements: 12.1 x 5.9 x 6.8 cm = volume: 253 mL. Echogenic parenchyma without mass lesion or hydronephrosis. Bladder: Appears normal for degree of bladder distention. Other: None. IMPRESSION: Echogenic kidneys bilaterally without hydronephrosis. Imaging  features compatible with medical renal disease. Electronically Signed   By: Kennith Center M.D.   On: 02/15/2022 08:23   MR CERVICAL SPINE W WO  CONTRAST  Result Date: 02/13/2022 CLINICAL DATA:  Mid back pain, worsening. Persistent bacteremia, evaluate for spinal infection EXAM: MRI CERVICAL AND THORACIC SPINE WITHOUT AND WITH CONTRAST TECHNIQUE: Multiplanar and multiecho pulse sequences of the cervical spine, to include the craniocervical junction and cervicothoracic junction, and the thoracic spine, were obtained without and with intravenous contrast. CONTRAST:  40mL GADAVIST GADOBUTROL 1 MMOL/ML IV SOLN COMPARISON:  02/07/2022 FINDINGS: MRI CERVICAL SPINE FINDINGS Very motion degraded Alignment: Physiologic. Vertebrae: No fracture, evidence of discitis, or bone lesion. Cord: Normal signal and morphology.  No evidence of collection Posterior Fossa, vertebral arteries, paraspinal tissues: Subcutaneous edema which is generalized. Disc levels: No evidence of herniation or impingement. MRI THORACIC SPINE FINDINGS Alignment:  Normal Vertebrae: New anterior inferior corner edema at T11. No disc T2 hyperintensity or endplate destruction. Cord:  Normal signal and morphology.  No evidence of cord collection Paraspinal and other soft tissues: Extensive bilateral pneumonia with cavitary features and complicated bilateral pleural effusion. Cavitation and pleural fluid is progressed from comparison CT 02/07/2022 Disc levels: No herniation, impingement, or facet spurring IMPRESSION: 1. New area of marrow edema at the anterior inferior corner of T11, likely early osteomyelitis/apophysitis. 2. No evidence of cervical spine infection. 3. Negative for cervicothoracic canal collection. 4. Partially covered bilateral septic pneumonia and complex pleural effusions, progressed from chest CT 02/07/2022. Electronically Signed   By: Tiburcio Pea M.D.   On: 02/13/2022 13:02   MR THORACIC SPINE W WO CONTRAST  Result Date:  02/13/2022 CLINICAL DATA:  Mid back pain, worsening. Persistent bacteremia, evaluate for spinal infection EXAM: MRI CERVICAL AND THORACIC SPINE WITHOUT AND WITH CONTRAST TECHNIQUE: Multiplanar and multiecho pulse sequences of the cervical spine, to include the craniocervical junction and cervicothoracic junction, and the thoracic spine, were obtained without and with intravenous contrast. CONTRAST:  79mL GADAVIST GADOBUTROL 1 MMOL/ML IV SOLN COMPARISON:  02/07/2022 FINDINGS: MRI CERVICAL SPINE FINDINGS Very motion degraded Alignment: Physiologic. Vertebrae: No fracture, evidence of discitis, or bone lesion. Cord: Normal signal and morphology.  No evidence of collection Posterior Fossa, vertebral arteries, paraspinal tissues: Subcutaneous edema which is generalized. Disc levels: No evidence of herniation or impingement. MRI THORACIC SPINE FINDINGS Alignment:  Normal Vertebrae: New anterior inferior corner edema at T11. No disc T2 hyperintensity or endplate destruction. Cord:  Normal signal and morphology.  No evidence of cord collection Paraspinal and other soft tissues: Extensive bilateral pneumonia with cavitary features and complicated bilateral pleural effusion. Cavitation and pleural fluid is progressed from comparison CT 02/07/2022 Disc levels: No herniation, impingement, or facet spurring IMPRESSION: 1. New area of marrow edema at the anterior inferior corner of T11, likely early osteomyelitis/apophysitis. 2. No evidence of cervical spine infection. 3. Negative for cervicothoracic canal collection. 4. Partially covered bilateral septic pneumonia and complex pleural effusions, progressed from chest CT 02/07/2022. Electronically Signed   By: Tiburcio Pea M.D.   On: 02/13/2022 13:02   DG CHEST PORT 1 VIEW  Result Date: 02/12/2022 CLINICAL DATA:  Pneumonia. EXAM: PORTABLE CHEST 1 VIEW COMPARISON:  Feb 09, 2022. FINDINGS: Stable cardiomediastinal silhouette. Increased right upper lobe airspace opacity is  noted most consistent with pneumonia. Smaller patchy opacities are noted throughout both lungs also most consistent with pneumonia. Bony thorax is unremarkable. IMPRESSION: Findings consistent with multifocal pneumonia bilaterally, with significantly increased opacity seen in right upper lobe consistent with worsening focal infiltrate. Electronically Signed   By: Lupita Raider M.D.   On: 02/12/2022 10:17   ECHO TEE  Result Date: 02/11/2022  TRANSESOPHOGEAL ECHO REPORT   Patient Name:   SEANNA SISLER Date of Exam: 02/11/2022 Medical Rec #:  [id number]         Height:       60.0 in Accession #:    [id number]        Weight:       142.6 lb Date of Birth:  10-Jul-1991         BSA:          1.617 m Patient Age:    30 years          BP:           108/74 mmHg Patient Gender: F                 HR:           100 bpm. Exam Location:  Inpatient Procedure: Transesophageal Echo, Cardiac Doppler, Color Doppler and 3D Echo Indications:    Endocarditis  History:        Patient has prior history of Echocardiogram examinations. Risk                 Factors:Current Smoker. IVDU. MRSA bacteremia. Sepsis.  Sonographer:    Ross Ludwig RDCS (AE) Referring Phys: [id number] Jonita Albee PROCEDURE: After discussion of the risks and benefits of a TEE, an informed consent was obtained from the patient. The transesophogeal probe was passed without difficulty through the esophogus of the patient. Sedation performed by different physician. The patient was monitored while under deep sedation. Anesthestetic sedation was provided intravenously by Anesthesiology: 102.  of Propofol. Image quality was good. The patient's vital signs; including heart rate, blood pressure, and oxygen saturation; remained stable throughout the procedure. The patient developed no complications during the procedure. IMPRESSIONS  1. Left ventricular ejection fraction, by estimation, is 65 to 70%. The left ventricle has normal function. The left ventricle has no  regional wall motion abnormalities. There is the interventricular septum is flattened in diastole ('D' shaped left ventricle), consistent with right ventricular volume overload.  2. Right ventricular systolic function is hyperdynamic. The right ventricular size is normal.  3. No left atrial/left atrial appendage thrombus was detected.  4. The mitral valve is normal in structure. No evidence of mitral valve regurgitation. No evidence of mitral stenosis.  5. There is a large mobile multilobed vegetation on the posterior tricuspid leaflet, which appears to be perforated. The largest component of the vegetation measures 23 x 5 mm. The tricuspid valve is abnormal. Tricuspid valve regurgitation is torrential.  6. 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 The aortic valve is normal in structure. Aortic valve regurgitation is not visualized. No aortic stenosis is present. FINDINGS  Left Ventricle: Left ventricular ejection fraction, by estimation, is 65 to 70%. The left ventricle has normal function. The left ventricle has no regional wall motion abnormalities. The left ventricular internal cavity size was normal in size. There is  no left ventricular hypertrophy. The interventricular septum is flattened in diastole ('D' shaped left ventricle), consistent with right ventricular volume overload. Right Ventricle: The right ventricular size is normal. No increase in right ventricular wall thickness. Right ventricular systolic function is hyperdynamic. Left Atrium: Left atrial size was normal in size. No left atrial/left atrial appendage thrombus was detected. Right Atrium: Right atrial size was normal in size. Pericardium: There is no evidence of pericardial effusion. Mitral Valve: The mitral valve is  normal in structure. No evidence of mitral valve regurgitation. No evidence of mitral valve stenosis. Tricuspid Valve: There is a large  mobile multilobed vegetation on the posterior tricuspid leaflet, which appears to be perforated. The largest component of the vegetation measures 23 x 5 mm. The tricuspid valve is abnormal. Tricuspid valve regurgitation is torrential. No evidence of tricuspid stenosis. 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 The aortic valve is normal in structure. Aortic valve regurgitation is not visualized. No aortic stenosis is present. Pulmonic Valve: The pulmonic valve was normal in structure. Pulmonic valve regurgitation is not visualized. No evidence of pulmonic stenosis. Aorta: The aortic root is normal in size and structure. IAS/Shunts: No atrial level shunt detected by color flow Doppler.  TRICUSPID VALVE TR Peak grad:   28.9 mmHg TR Vmax:        269.00 cm/s Rachelle Hora Croitoru MD Electronically signed by Thurmon Fair MD Signature Date/Time: 02/11/2022/3:11:16 PM    Final    CT HEAD WO CONTRAST ( )  Result Date: 02/10/2022 CLINICAL DATA:  Mental status change EXAM: CT HEAD WITHOUT CONTRAST TECHNIQUE: Contiguous axial images were obtained from the base of the skull through the vertex without intravenous contrast. RADIATION DOSE REDUCTION: This exam was performed according to the departmental dose-optimization program which includes automated exposure control, adjustment of the mA and/or kV according to patient size and/or use of iterative reconstruction technique. COMPARISON:  06/11/2008 FINDINGS: Brain: No evidence of acute infarction, hemorrhage, hydrocephalus, extra-axial collection or mass lesion/mass effect. Vascular: No hyperdense vessel or unexpected calcification. Skull: No osseous abnormality. Sinuses/Orbits: Visualized paranasal sinuses are clear. Visualized mastoid sinuses are clear. Visualized orbits demonstrate no focal abnormality. Other: None IMPRESSION: 1. No acute intracranial findings.  Electronically Signed   By: Elige Ko M.D.   On: 02/10/2022 19:01   EEG adult  Result Date: 02/10/2022 Charlsie Quest, MD     02/10/2022  6:13 PM Patient Name: AVABELLA WAILES MRN: [id number] Epilepsy Attending: Charlsie Quest Referring Physician/Provider: Lorin Glass, MD Date: 02/10/2022 Duration: 21.58 mins Patient history: 31yo F with ams. EEG to evaluate for seizure Level of alertness: Awake, asleep AEDs during EEG study: Clonazepam, Xanax Technical aspects: This EEG study was done with scalp electrodes positioned according to the 10-20 International system of electrode placement. Electrical activity was acquired at a sampling rate of 500Hz  and reviewed with a high frequency filter of 70Hz  and a low frequency filter of 1Hz . EEG data were recorded continuously and digitally stored. Description: The posterior dominant rhythm consists of 8-9 Hz activity of moderate voltage (25-35 uV) seen predominantly in posterior head regions, symmetric and reactive to eye opening and eye closing. Sleep was characterized by vertex waves, sleep spindles (12 to 14 Hz), maximal frontocentral region. EEG showed intermittent generalized 3 to 6 Hz theta-delta slowing. Hyperventilation and photic stimulation were not performed.   ABNORMALITY - Intermittent slow, generalized IMPRESSION: This study is suggestive of mild diffuse encephalopathy, nonspecific etiology. No seizures or epileptiform discharges were seen throughout the recording. Charlsie Quest   DG CHEST PORT 1 VIEW  Result Date: 02/09/2022 CLINICAL DATA:  Pleural effusion EXAM: PORTABLE CHEST 1 VIEW COMPARISON:  02/08/2022 FINDINGS: Normal heart size. Patchy bilateral airspace opacities with interval worsening compared to prior. No large pleural fluid collection. No pneumothorax. IMPRESSION: Patchy bilateral airspace opacities with interval worsening compared to prior. Electronically Signed   By: Duanne Guess  D.O.   On: 02/09/2022 14:02   MR SHOULDER  LEFT W WO CONTRAST  Result Date: 02/08/2022 CLINICAL DATA:  IV drug use.  Concern for left shoulder infection EXAM: MRI OF THE LEFT SHOULDER WITHOUT AND WITH CONTRAST TECHNIQUE: Multiplanar, multisequence MR imaging of the left shoulder was performed before and after the administration of intravenous contrast. CONTRAST:  5mL GADAVIST GADOBUTROL 1 MMOL/ML IV SOLN COMPARISON:  X-ray 02/06/2022 FINDINGS: Technical Note: Despite efforts by the technologist and patient, motion artifact is present on today's exam and could not be eliminated. This reduces exam sensitivity and specificity. Rotator cuff: The supraspinatus, infraspinatus, subscapularis, and teres minor tendons are intact. Muscles: Intramuscular edema within the proximal aspect of the anterior deltoid muscle adjacent to the acromion process. Preserved bulk and signal intensity of the rotator cuff musculature without edema, atrophy, or fatty infiltration. Biceps long head:  Intact and appropriately positioned. Acromioclavicular Joint: Small acromioclavicular joint effusion with peripheral enhancement and surrounding soft tissue edema. No significant fluid within the subacromial-subdeltoid bursal space. Glenohumeral Joint: No joint effusion. No cartilage defect. Labrum: Grossly intact although evaluation is limited in the absence of intra-articular fluid. No paralabral cyst. Bones: Mild subchondral bone marrow edema within the distal clavicle and adjacent acromion without a well-defined erosion. No definite marrow replacement. Distal clavicle is slightly elevated relative to the acromion without evidence of acute injury. Acromioclavicular ligaments appear thickened, but intact. No additional sites of bone marrow edema. No acute fracture or dislocation. Other: Diffuse soft tissue swelling about the shoulder with overlying subcutaneous edema and ill-defined fluid. No well-defined or rim enhancing fluid collections are seen at this time. Included portion of the  left lung field demonstrates multifocal airspace consolidations compatible with known pneumonia/septic emboli. IMPRESSION: 1. Small acromioclavicular joint effusion with peripheral enhancement and surrounding soft tissue edema. Findings are suspicious for septic arthritis in the setting of known systemic infection. 2. Mild subchondral bone marrow edema within the distal clavicle and adjacent acromion without a well-defined erosion. Findings may reflect reactive osteitis versus early acute osteomyelitis. 3. Diffuse soft tissue swelling about the shoulder with ill-defined fluid. No well-defined or rim enhancing fluid collections seen at this time. 4. No evidence of septic arthritis of the glenohumeral joint. 5. Multifocal right lung consolidations compatible with known multifocal pneumonia/septic emboli. Electronically Signed   By: Duanne Guess D.O.   On: 02/08/2022 15:11   DG CHEST PORT 1 VIEW  Result Date: 02/08/2022 CLINICAL DATA:  Shortness of breath and fever. EXAM: PORTABLE CHEST 1 VIEW COMPARISON:  02/07/2022 CT and prior studies FINDINGS: Patchy ground-glass/airspace opacities are again noted likely representing infection/septic emboli as previously described. No definite new pulmonary opacities are present. No pleural effusion or pneumothorax. Cardiomediastinal silhouette is unchanged. No acute bony abnormalities are noted. IMPRESSION: Patchy ground-glass/airspace opacities again noted likely representing infection/septic emboli. Electronically Signed   By: Harmon Pier M.D.   On: 02/08/2022 15:01   MR CERVICAL SPINE WO CONTRAST  Result Date: 02/07/2022 CLINICAL DATA:  Neck pain with possible infection.  IV drug abuse. EXAM: MRI CERVICAL, THORACIC AND LUMBAR SPINE WITHOUT CONTRAST TECHNIQUE: Multiplanar and multiecho pulse sequences of the cervical spine, to include the craniocervical junction and cervicothoracic junction, and thoracic and lumbar spine, were obtained without intravenous contrast.  COMPARISON:  None Available. FINDINGS: MRI CERVICAL SPINE FINDINGS Alignment: Physiologic. Vertebrae: No fracture, evidence of discitis, or bone lesion. Cord: Normal signal and morphology. Posterior Fossa, vertebral arteries, paraspinal tissues: Negative. Disc levels: No spinal canal or neural foraminal stenosis.  MRI THORACIC SPINE FINDINGS Alignment:  Physiologic. Vertebrae: No fracture, evidence of discitis, or bone lesion. Cord:  Normal signal and morphology. Paraspinal and other soft tissues: There are multiple areas of consolidation within both lungs concerning for septic emboli. Disc levels: There is no spinal canal or neural foraminal stenosis. MRI LUMBAR SPINE FINDINGS Segmentation:  Standard Alignment:  Physiologic. Vertebrae:  No fracture, evidence of discitis, or bone lesion. Conus medullaris and cauda equina: Conus extends to the L1 level. Conus and cauda equina appear normal. Paraspinal and other soft tissues: Negative Disc levels: No spinal canal stenosis IMPRESSION: 1. Motion degraded examination. Additionally, postcontrast imaging could not be obtained due to patient inability to tolerate the full length examination. 2. No discitis-osteomyelitis or epidural abscess. 3. Multiple areas of consolidation within both lungs concerning for septic emboli. Electronically Signed   By: Deatra RobinsonKevin  Herman M.D.   On: 02/07/2022 23:26   MR LUMBAR SPINE WO CONTRAST  Result Date: 02/07/2022 CLINICAL DATA:  Neck pain with possible infection.  IV drug abuse. EXAM: MRI CERVICAL, THORACIC AND LUMBAR SPINE WITHOUT CONTRAST TECHNIQUE: Multiplanar and multiecho pulse sequences of the cervical spine, to include the craniocervical junction and cervicothoracic junction, and thoracic and lumbar spine, were obtained without intravenous contrast. COMPARISON:  None Available. FINDINGS: MRI CERVICAL SPINE FINDINGS Alignment: Physiologic. Vertebrae: No fracture, evidence of discitis, or bone lesion. Cord: Normal signal and  morphology. Posterior Fossa, vertebral arteries, paraspinal tissues: Negative. Disc levels: No spinal canal or neural foraminal stenosis. MRI THORACIC SPINE FINDINGS Alignment:  Physiologic. Vertebrae: No fracture, evidence of discitis, or bone lesion. Cord:  Normal signal and morphology. Paraspinal and other soft tissues: There are multiple areas of consolidation within both lungs concerning for septic emboli. Disc levels: There is no spinal canal or neural foraminal stenosis. MRI LUMBAR SPINE FINDINGS Segmentation:  Standard Alignment:  Physiologic. Vertebrae:  No fracture, evidence of discitis, or bone lesion. Conus medullaris and cauda equina: Conus extends to the L1 level. Conus and cauda equina appear normal. Paraspinal and other soft tissues: Negative Disc levels: No spinal canal stenosis IMPRESSION: 1. Motion degraded examination. Additionally, postcontrast imaging could not be obtained due to patient inability to tolerate the full length examination. 2. No discitis-osteomyelitis or epidural abscess. 3. Multiple areas of consolidation within both lungs concerning for septic emboli. Electronically Signed   By: Deatra RobinsonKevin  Herman M.D.   On: 02/07/2022 23:26   MR THORACIC SPINE WO CONTRAST  Result Date: 02/07/2022 CLINICAL DATA:  Neck pain with possible infection.  IV drug abuse. EXAM: MRI CERVICAL, THORACIC AND LUMBAR SPINE WITHOUT CONTRAST TECHNIQUE: Multiplanar and multiecho pulse sequences of the cervical spine, to include the craniocervical junction and cervicothoracic junction, and thoracic and lumbar spine, were obtained without intravenous contrast. COMPARISON:  None Available. FINDINGS: MRI CERVICAL SPINE FINDINGS Alignment: Physiologic. Vertebrae: No fracture, evidence of discitis, or bone lesion. Cord: Normal signal and morphology. Posterior Fossa, vertebral arteries, paraspinal tissues: Negative. Disc levels: No spinal canal or neural foraminal stenosis. MRI THORACIC SPINE FINDINGS Alignment:   Physiologic. Vertebrae: No fracture, evidence of discitis, or bone lesion. Cord:  Normal signal and morphology. Paraspinal and other soft tissues: There are multiple areas of consolidation within both lungs concerning for septic emboli. Disc levels: There is no spinal canal or neural foraminal stenosis. MRI LUMBAR SPINE FINDINGS Segmentation:  Standard Alignment:  Physiologic. Vertebrae:  No fracture, evidence of discitis, or bone lesion. Conus medullaris and cauda equina: Conus extends to the L1 level. Conus and cauda equina appear normal.  Paraspinal and other soft tissues: Negative Disc levels: No spinal canal stenosis IMPRESSION: 1. Motion degraded examination. Additionally, postcontrast imaging could not be obtained due to patient inability to tolerate the full length examination. 2. No discitis-osteomyelitis or epidural abscess. 3. Multiple areas of consolidation within both lungs concerning for septic emboli. Electronically Signed   By: Deatra Robinson M.D.   On: 02/07/2022 23:26   ECHOCARDIOGRAM COMPLETE  Result Date: 02/07/2022    ECHOCARDIOGRAM REPORT   Patient Name:   LORAYNE GETCHELL Date of Exam: 02/07/2022 Medical Rec #:  [id number]         Height:       60.0 in Accession #:    [id number]        Weight:       114.6 lb Date of Birth:  March 08, 1991         BSA:          1.473 m Patient Age:    30 years          BP:           103/67 mmHg Patient Gender: F                 HR:           108 bpm. Exam Location:  Inpatient Procedure: 2D Echo, 3D Echo, Cardiac Doppler and Color Doppler REPORT CONTAINS CRITICAL RESULT Indications:    Bacteremia  History:        Patient has no prior history of Echocardiogram examinations.                 Signs/Symptoms:Chest Pain; Risk Factors:Current Smoker. IVDU.  Sonographer:    Sheralyn Boatman RDCS Referring Phys: [id number] [facility]  Sonographer Comments: Technically difficult study due to poor echo windows. Patient supine with extreme pain in left shoulder. Attempted to move  patient to the center of bed. Extremely difficult study due to reaching across bed. IMPRESSIONS  1. Left ventricular ejection fraction, by estimation, is 60 to 65%. The left ventricle has normal function. The left ventricle has no regional wall motion abnormalities. Left ventricular diastolic parameters were normal.  2. Right ventricular systolic function is normal. The right ventricular size is normal.  3. The mitral valve is normal in structure. No evidence of mitral valve regurgitation. No evidence of mitral stenosis.  4. Likely vegetation on the TV However appears on the ventricular side of the valve Suggest TEE to further evaluate Large mobile mass appers attached to the TV chords.  5. The aortic valve is normal in structure. Aortic valve regurgitation is not visualized. No aortic stenosis is present.  6. The inferior vena cava is normal in size with greater than 50% respiratory variability, suggesting right atrial pressure of 3 mmHg. FINDINGS  Left Ventricle: Left ventricular ejection fraction, by estimation, is 60 to 65%. The left ventricle has normal function. The left ventricle has no regional wall motion abnormalities. The left ventricular internal cavity size was normal in size. There is  no left ventricular hypertrophy. Left ventricular diastolic parameters were normal. Right Ventricle: The right ventricular size is normal. No increase in right ventricular wall thickness. Right ventricular systolic function is normal. Left Atrium: Left atrial size was normal in size. Right Atrium: Right atrial size was normal in size. Pericardium: There is no evidence of pericardial effusion. Mitral Valve: The mitral valve is normal in structure. No evidence of mitral valve regurgitation. No evidence of mitral valve stenosis. MV peak gradient, 10.2 mmHg.  The mean mitral valve gradient is 4.0 mmHg. Tricuspid Valve: Likely vegetation on the TV However appears on the ventricular side of the valve Suggest TEE to further  evaluate Large mobile mass appers attached to the TV chords. The tricuspid valve is normal in structure. Tricuspid valve regurgitation is mild . No evidence of tricuspid stenosis. Aortic Valve: The aortic valve is normal in structure. Aortic valve regurgitation is not visualized. No aortic stenosis is present. Pulmonic Valve: The pulmonic valve was normal in structure. Pulmonic valve regurgitation is not visualized. No evidence of pulmonic stenosis. Aorta: The aortic root is normal in size and structure. Venous: The inferior vena cava is normal in size with greater than 50% respiratory variability, suggesting right atrial pressure of 3 mmHg. IAS/Shunts: No atrial level shunt detected by color flow Doppler.  LEFT VENTRICLE PLAX 2D LVIDd:         4.30 cm     Diastology LVIDs:         2.80 cm     LV e' medial:    6.66 cm/s LV PW:         0.90 cm     LV E/e' medial:  17.9 LV IVS:        0.90 cm     LV e' lateral:   10.30 cm/s LVOT diam:     1.70 cm     LV E/e' lateral: 11.6 LV SV:         42 LV SV Index:   29 LVOT Area:     2.27 cm                             3D Volume EF: LV Volumes (MOD)           3D EF:        61 % LV vol d, MOD A2C: 61.3 ml LV EDV:       73 ml LV vol d, MOD A4C: 49.9 ml LV ESV:       28 ml LV vol s, MOD A2C: 19.7 ml LV SV:        44 ml LV vol s, MOD A4C: 17.9 ml LV SV MOD A2C:     41.6 ml LV SV MOD A4C:     49.9 ml LV SV MOD BP:      39.8 ml RIGHT VENTRICLE             IVC RV S prime:     13.00 cm/s  IVC diam: 2.00 cm TAPSE (M-mode): 1.9 cm LEFT ATRIUM             Index        RIGHT ATRIUM           Index LA diam:        2.70 cm 1.83 cm/m   RA Area:     10.00 cm LA Vol (A2C):   16.3 ml 11.06 ml/m  RA Volume:   22.00 ml  14.93 ml/m LA Vol (A4C):   16.6 ml 11.27 ml/m LA Biplane Vol: 17.6 ml 11.94 ml/m  AORTIC VALVE LVOT Vmax:   138.00 cm/s LVOT Vmean:  90.000 cm/s LVOT VTI:    0.186 m  AORTA Ao Root diam: 2.80 cm Ao Asc diam:  2.90 cm MITRAL VALVE MV Area (PHT): 5.46 cm     SHUNTS MV Area VTI:    1.37 cm     Systemic VTI:  0.19  m MV Peak grad:  10.2 mmHg    Systemic Diam: 1.70 cm MV Mean grad:  4.0 mmHg MV Vmax:       1.60 m/s MV Vmean:      98.9 cm/s MV Decel Time: 139 msec MV E velocity: 119.00 cm/s MV A velocity: 109.50 cm/s MV E/A ratio:  1.09 Charlton Haws MD Electronically signed by Charlton Haws MD Signature Date/Time: 02/07/2022/6:15:21 PM    Final    CT Angio Chest Pulmonary Embolism (PE) W or WO Contrast  Result Date: 02/07/2022 CLINICAL DATA:  Sepsis and IV drug abuse. Fever and pleuritic chest pain. Left shoulder pain. EXAM: CT ANGIOGRAPHY CHEST WITH CONTRAST TECHNIQUE: Multidetector CT imaging of the chest was performed using the standard protocol during bolus administration of intravenous contrast. Multiplanar CT image reconstructions and MIPs were obtained to evaluate the vascular anatomy. RADIATION DOSE REDUCTION: This exam was performed according to the departmental dose-optimization program which includes automated exposure control, adjustment of the mA and/or kV according to patient size and/or use of iterative reconstruction technique. CONTRAST:  80mL OMNIPAQUE IOHEXOL 350 MG/ML SOLN COMPARISON:  Chest radiograph 02/06/2022 FINDINGS: Cardiovascular: No filling defect is identified in the pulmonary arterial tree to suggest pulmonary embolus. Mediastinum/Nodes: Ill-defined left supraclavicular adenopathy up to 1.1 cm in diameter with hazy indistinctness of tissue planes in the left supraclavicular region. Left lower neck level V lymph node 0.6 cm in short axis on image 2 series 5. Left level IV lymph node 0.8 cm in short axis on image 7 series 5. Indistinctly marginated suspected thoracic adenopathy including a 1.8 cm in short axis right eccentric subcarinal node, 0.9 cm left infrahilar lymph node,, and a 0.9 cm in short axis AP window lymph node. Right hilar node 1.1 cm in short axis on image 42 series 5. Indistinct stranding in the mediastinal tissues. Lungs/Pleura: Bilateral  scattered pulmonary nodules of various density along with rounded regions of bilateral airspace opacity measuring up to about 6 cm in diameter. Some of these have internal gas density such as the 2.9 by 1.9 cm left lower lobe airspace opacity on image 98 of series 6, potentially from mild early cavitation. Secondary pulmonary lobular septal thickening at the lung apices. Airway thickening is present, suggesting bronchitis or reactive airways disease. Trace left pleural effusion. Upper Abdomen: Splenomegaly. Musculoskeletal: Diffuse subcutaneous edema. Review of the MIP images confirms the above findings. IMPRESSION: 1. Scattered bilateral mixed density nodules and rounded airspace opacities scattered in both lungs, with potential mild cavitation in one of the left lower lobe airspace opacities, and with indistinct mediastinal and hilar adenopathy. Given the clinical context, the appearance favors multifocal pneumonia and potentially septic emboli. However, no filling defect is identified in the pulmonary arterial tree to suggest bland pulmonary arterial thrombus. Surveillance chest imaging recommended to ensure expected improvement with therapy. 2. Ill-defined left supraclavicular adenopathy and localized fat stranding slightly disproportionate to the diffuse subcutaneous edema. 3. Trace left pleural effusion. 4. Airway thickening is present, suggesting bronchitis or reactive airways disease. 5. Splenomegaly. Electronically Signed   By: Gaylyn Rong M.D.   On: 02/07/2022 11:49   US Abdomen Limited RUQ (LIVER/GB)  Result Date: 02/07/2022 CLINICAL DATA:  31 year old female with sepsis and abnormal LFTs. EXAM: ULTRASOUND ABDOMEN LIMITED RIGHT UPPER QUADRANT COMPARISON:  None Available. FINDINGS: Gallbladder: Partially contracted gallbladder but with thickened and edematous appearing gallbladder wall (4 mm image 4). Sludge within the neck of the gallbladder (image 5). No shadowing echogenic stones identified.  No sonographic Eulah Pont  sign elicited. Common bile duct: Diameter: 4 mm, normal. Liver: Hepatic starry sky appearance (image 8) raising the possibility of hepatic parenchymal edema. No intrahepatic biliary ductal dilatation suspected. No discrete liver lesion. Portal vein is patent on color Doppler imaging with normal direction of blood flow towards the liver. Other: Negative visible right kidney. There is trace perihepatic ascites (image 52). IMPRESSION: 1. Accentuated appearance of the portal triads which can be seen in the setting of hepatic parenchymal edema. There is trace perihepatic ascites. 2. Partially contracted gallbladder with edematous wall and sludge. Gallbladder edema reactive to #1 seems more likely than acalculus cholecystitis. 3. No evidence of biliary duct obstruction. Electronically Signed   By: Odessa Fleming M.D.   On: 02/07/2022 06:50   DG Chest Port 1 View  Result Date: 02/06/2022 CLINICAL DATA:  Questionable sepsis. EXAM: PORTABLE CHEST 1 VIEW COMPARISON:  Chest x-ray 12/01/2015. FINDINGS: The heart size and mediastinal contours are within normal limits. Both lungs are clear. The visualized skeletal structures are unremarkable. IMPRESSION: No active disease. Electronically Signed   By: Darliss Cheney M.D.   On: 02/06/2022 20:47   DG Shoulder Left Portable  Result Date: 02/06/2022 CLINICAL DATA:  Pain, fever, heroin abuse EXAM: LEFT SHOULDER COMPARISON:  None Available. FINDINGS: Frontal and lateral views of the left shoulder are obtained. There are no acute or destructive bony lesions. Joint spaces are well preserved. Soft tissues are unremarkable without subcutaneous gas or radiopaque foreign body. Visualized portions of the left chest are clear. IMPRESSION: 1. Unremarkable left shoulder. Electronically Signed   By: Sharlet Salina M.D.   On: 02/06/2022 20:43     TODAY-DAY OF DISCHARGE:  Subjective:   Jeronimo Greaves LEFT AMA ON 6/7-SEE PROGRESS NOTE ON THAT DAY  Objective:   Blood  pressure (!) 95/59, pulse 89, temperature 98.5 F (36.9 C), temperature source Oral, resp. rate 20, height 5' (1.524 m), weight 74 kg, SpO2 94 %. No intake or output data in the 24 hours ending 02/27/22 1719 Filed Weights   02/25/22 0452 02/26/22 0500 02/26/22 0924  Weight: 78.5 kg 80.4 kg 74 kg       PERTINENT RADIOLOGIC STUDIES: No results found.   PERTINENT LAB RESULTS: CBC: Recent Labs    02/26/22 0500  WBC 14.5*  HGB 9.0*  HCT 27.6*  PLT 318   CMET CMP     Component Value Date/Time   NA 135 02/26/2022 0500   K 4.1 02/26/2022 0500   CL 100 02/26/2022 0500   CO2 24 02/26/2022 0500   GLUCOSE 99 02/26/2022 0500   BUN 48 (H) 02/26/2022 0500   CREATININE 4.10 (H) 02/26/2022 0500   CALCIUM 7.4 (L) 02/26/2022 0500   PROT 5.7 (L) 02/16/2022 0509   ALBUMIN <1.5 (L) 02/25/2022 0214   AST 18 02/16/2022 0509   ALT 16 02/16/2022 0509   ALKPHOS 78 02/16/2022 0509   BILITOT 1.5 (H) 02/16/2022 0509   GFRNONAA 14 (L) 02/26/2022 0500   GFRAA >60 11/30/2015 2108    GFR Estimated Creatinine Clearance: 17.9 mL/min (A) (by C-G formula based on SCr of 4.1 mg/dL (H)). No results for input(s): "LIPASE", "AMYLASE" in the last 72 hours. No results for input(s): "CKTOTAL", "CKMB", "CKMBINDEX", "TROPONINI" in the last 72 hours. Invalid input(s): "POCBNP" No results for input(s): "DDIMER" in the last 72 hours. No results for input(s): "HGBA1C" in the last 72 hours. No results for input(s): "CHOL", "HDL", "LDLCALC", "TRIG", "CHOLHDL", "LDLDIRECT" in the last 72 hours. No results for input(s): "TSH", "  T4TOTAL", "T3FREE", "THYROIDAB" in the last 72 hours.  Invalid input(s): "FREET3" No results for input(s): "VITAMINB12", "FOLATE", "FERRITIN", "TIBC", "IRON", "RETICCTPCT" in the last 72 hours. Coags: No results for input(s): "INR" in the last 72 hours.  Invalid input(s): "PT" Microbiology: No results found for this or any previous visit (from the past 240 hour(s)).  FURTHER  DISCHARGE INSTRUCTIONS:  Get Medicines reviewed and adjusted: Please take all your medications with you for your next visit with your Primary MD  Laboratory/radiological data: Please request your Primary MD to go over all hospital tests and procedure/radiological results at the follow up, please ask your Primary MD to get all Hospital records sent to his/her office.  In some cases, they will be blood work, cultures and biopsy results pending at the time of your discharge. Please request that your primary care M.D. goes through all the records of your hospital data and follows up on these results.  Also Note the following: If you experience worsening of your admission symptoms, develop shortness of breath, life threatening emergency, suicidal or homicidal thoughts you must seek medical attention immediately by calling 911 or calling your MD immediately  if symptoms less severe.  You must read complete instructions/literature along with all the possible adverse reactions/side effects for all the Medicines you take and that have been prescribed to you. Take any new Medicines after you have completely understood and accpet all the possible adverse reactions/side effects.   Do not drive when taking Pain medications or sleeping medications (Benzodaizepines)  Do not take more than prescribed Pain, Sleep and Anxiety Medications. It is not advisable to combine anxiety,sleep and pain medications without talking with your primary care practitioner  Special Instructions: If you have smoked or chewed Tobacco  in the last 2 yrs please stop smoking, stop any regular Alcohol  and or any Recreational drug use.  Wear Seat belts while driving.  Please note: You were cared for by a hospitalist during your hospital stay. Once you are discharged, your primary care physician will handle any further medical issues. Please note that NO REFILLS for any discharge medications will be authorized once you are discharged,  as it is imperative that you return to your primary care physician (or establish a relationship with a primary care physician if you do not have one) for your post hospital discharge needs so that they can reassess your need for medications and monitor your lab values.  Total Time spent coordinating discharge including counseling, education and face to face time equals less than 30 minutes.  SignedJeoffrey Massed 02/27/2022 5:19 PM

## 2022-02-27 NOTE — Plan of Care (Signed)
Attempted to see pt on 6/7 AM, she was not in her room.

## 2022-12-14 DIAGNOSIS — I340 Nonrheumatic mitral (valve) insufficiency: Secondary | ICD-10-CM

## 2022-12-14 DIAGNOSIS — I361 Nonrheumatic tricuspid (valve) insufficiency: Secondary | ICD-10-CM

## 2023-02-03 ENCOUNTER — Other Ambulatory Visit: Payer: Self-pay

## 2023-02-03 DIAGNOSIS — F1910 Other psychoactive substance abuse, uncomplicated: Secondary | ICD-10-CM | POA: Insufficient documentation

## 2023-02-03 DIAGNOSIS — N059 Unspecified nephritic syndrome with unspecified morphologic changes: Secondary | ICD-10-CM | POA: Insufficient documentation

## 2023-02-03 DIAGNOSIS — F1120 Opioid dependence, uncomplicated: Secondary | ICD-10-CM | POA: Insufficient documentation

## 2023-02-03 DIAGNOSIS — Z8619 Personal history of other infectious and parasitic diseases: Secondary | ICD-10-CM | POA: Insufficient documentation

## 2023-02-03 DIAGNOSIS — N189 Chronic kidney disease, unspecified: Secondary | ICD-10-CM | POA: Insufficient documentation

## 2023-02-03 DIAGNOSIS — Z681 Body mass index (BMI) 19 or less, adult: Secondary | ICD-10-CM | POA: Insufficient documentation

## 2023-02-03 DIAGNOSIS — K739 Chronic hepatitis, unspecified: Secondary | ICD-10-CM | POA: Insufficient documentation

## 2023-02-03 DIAGNOSIS — N1831 Chronic kidney disease, stage 3a: Secondary | ICD-10-CM | POA: Insufficient documentation

## 2023-02-09 DIAGNOSIS — I10 Essential (primary) hypertension: Secondary | ICD-10-CM

## 2023-02-09 HISTORY — DX: Essential (primary) hypertension: I10

## 2023-03-12 ENCOUNTER — Ambulatory Visit: Payer: Medicaid Other | Attending: Cardiology | Admitting: Cardiology

## 2023-04-02 ENCOUNTER — Encounter (HOSPITAL_COMMUNITY): Payer: Self-pay

## 2023-04-02 ENCOUNTER — Emergency Department (HOSPITAL_COMMUNITY): Payer: MEDICAID

## 2023-04-02 ENCOUNTER — Emergency Department (HOSPITAL_COMMUNITY)
Admission: EM | Admit: 2023-04-02 | Discharge: 2023-04-02 | Disposition: A | Discharge status: Home / Self Care | Payer: MEDICAID | Attending: Emergency Medicine | Admitting: Emergency Medicine

## 2023-04-02 ENCOUNTER — Other Ambulatory Visit: Payer: Self-pay

## 2023-04-02 DIAGNOSIS — S4992XA Unspecified injury of left shoulder and upper arm, initial encounter: Secondary | ICD-10-CM | POA: Diagnosis present

## 2023-04-02 DIAGNOSIS — W1839XA Other fall on same level, initial encounter: Secondary | ICD-10-CM | POA: Insufficient documentation

## 2023-04-02 DIAGNOSIS — S42202A Unspecified fracture of upper end of left humerus, initial encounter for closed fracture: Secondary | ICD-10-CM | POA: Diagnosis not present

## 2023-04-02 MED ORDER — OXYCODONE-ACETAMINOPHEN 5-325 MG PO TABS: 1.0000 | ORAL_TABLET | Freq: Three times a day (TID) | ORAL | 0 refills | Status: AC | PRN

## 2023-04-02 MED ORDER — HYDROMORPHONE HCL 1 MG/ML IJ SOLN
1.0000 mg | Freq: Once | INTRAMUSCULAR | Status: AC
  Administered 2023-04-02: 1 mg via INTRAVENOUS

## 2023-04-02 MED ORDER — HYDROMORPHONE HCL 1 MG/ML IJ SOLN
1.0000 mg | Freq: Once | INTRAMUSCULAR | Status: DC
  Filled 2023-04-02: qty 1

## 2023-04-02 NOTE — ED Provider Notes (Signed)
[place] [facility] AT [facility] Provider Note   CSN: [id number] Arrival date & time: 04/02/23  0150     History  Chief Complaint  Patient presents with   Tora Kindred is a 32 y.o. female.  32 yo F here after multiple falls for left shoulder pain. Larey Seat about a week ago and landed on outstretched left arm heard a crack in the shoulder area and severe pain since then. Larey Seat again yesterday and landed same way, hurt worse. Came here today. No other injuries.    Fall       Home Medications Prior to Admission medications   Medication Sig Start Date End Date Taking? Authorizing Provider  oxyCODONE-acetaminophen (PERCOCET) 5-325 MG tablet Take 1 tablet by mouth every 8 (eight) hours as needed. 04/02/23  Yes Deaisha Welborn, Barbara Cower, MD  DULoxetine (CYMBALTA) 20 MG capsule Take 20 mg by mouth daily.    [provider]  furosemide (LASIX) 40 MG tablet Take 1 tablet by mouth daily. 03/31/22   [provider]  losartan (COZAAR) 50 MG tablet Take 50 mg by mouth in the morning and at bedtime.    [provider]  NIFEdipine (ADALAT CC) 60 MG 24 hr tablet Take 60 mg by mouth in the morning and at bedtime.    [provider]  pantoprazole (PROTONIX) 40 MG tablet Take 40 mg by mouth daily.    [provider]  polyethylene glycol powder (GLYCOLAX/MIRALAX) 17 GM/SCOOP powder Take 1 Container by mouth daily.    [provider]      Allergies    Doxycycline and Suboxone [buprenorphine hcl-naloxone hcl]    Review of Systems   Review of Systems  Physical Exam Updated Vital Signs BP (!) 164/126   Pulse (!) 113   Temp 99.7 F (37.6 C) (Oral)   Resp 16   Ht 5' (1.524 m)   Wt 51.7 kg   SpO2 100%   BMI 22.26 kg/m  Physical Exam Vitals and nursing note reviewed.  Constitutional:      Appearance: She is well-developed.     Comments: Crying uncontrollable secondary to pain, difficult to get a full history 2/2  same.   HENT:     Head: Normocephalic and atraumatic.     Mouth/Throat:     Mouth: Mucous membranes are moist.  Eyes:     Pupils: Pupils are equal, round, and reactive to light.  Cardiovascular:     Rate and Rhythm: Normal rate and regular rhythm.  Pulmonary:     Effort: No respiratory distress.     Breath sounds: No stridor.  Abdominal:     General: There is no distension.  Musculoskeletal:        General: Tenderness (left proximal humerus) and deformity present.     Cervical back: Normal range of motion.  Neurological:     Mental Status: She is alert.     ED Results / Procedures / Treatments   Labs (all labs ordered are listed, but only abnormal results are displayed) Labs Reviewed - No data to display  EKG None  Radiology DG Elbow Complete Left  Result Date: 04/02/2023 CLINICAL DATA:  Status post fall. EXAM: LEFT ELBOW - COMPLETE 3+ VIEW COMPARISON:  None Available. FINDINGS: There is no evidence of fracture, dislocation, or joint effusion. There is no evidence of arthropathy or other focal bone abnormality. Dorsal soft tissue swelling is seen which may be secondary to the patient's body habitus. IMPRESSION:  Dorsal soft tissue swelling without evidence of acute fracture or dislocation. Electronically Signed   By: Aram Candela M.D.   On: 04/02/2023 03:20   DG Shoulder Left  Result Date: 04/02/2023 CLINICAL DATA:  Status post fall. EXAM: LEFT SHOULDER - 2+ VIEW COMPARISON:  None Available. FINDINGS: There is an acute, mildly displaced fracture involving the neck and proximal shaft of the left humerus. There is no evidence of dislocation. There is no evidence of arthropathy or other focal bone abnormality. Soft tissues are unremarkable. IMPRESSION: Acute fracture of the proximal left humerus. Electronically Signed   By: Aram Candela M.D.   On: 04/02/2023 03:19    Procedures Procedures    Medications Ordered in ED Medications  HYDROmorphone (DILAUDID) injection 1  mg (1 mg Intravenous Given 04/02/23 0348)    ED Course/ Medical Decision Making/ A&P                             Medical Decision Making Amount and/or Complexity of Data Reviewed Radiology: ordered.  Risk Prescription drug management.  My interpretation of the XR appeears to have a left humerus fracture around the head/proximal shaft.  Sling placed. Pain meds given. Will follow up with ortho.   Final Clinical Impression(s) / ED Diagnoses Final diagnoses:  Closed fracture of proximal end of left humerus, unspecified fracture morphology, initial encounter    Rx / DC Orders ED Discharge Orders          Ordered    oxyCODONE-acetaminophen (PERCOCET) 5-325 MG tablet  Every 8 hours PRN        04/02/23 0545              Cheresa Siers, Barbara Cower, MD 04/02/23 1610

## 2023-04-02 NOTE — ED Triage Notes (Signed)
Pt complaining of pain in the left elbow and shoulder from a fall. Pt had a fall from standing 1 week ago and landed on that arm and again 2 days ago. Had surgery on that arm last year in may. Elbow looks swollen and is painful to touch.

## 2023-04-02 NOTE — Progress Notes (Signed)
Orthopedic Tech Progress Note Patient Details:  Carol Rivera [birth year] [id number]  Ortho Devices Ortho Device/Splint Location: LUE Ortho Device/Splint Interventions: Ordered, Application, Adjustment   Post Interventions Patient Tolerated: Well Instructions Provided: Care of device, Adjustment of device  [name] 04/02/2023, 4:33 AM
